# Patient Record
Sex: Female | Born: 1990
Health system: Southern US, Community
[De-identification: ages and names within clinical notes are randomized; demographics above are authoritative.]

## PROBLEM LIST (undated history)

## (undated) DIAGNOSIS — E282 Polycystic ovarian syndrome: Secondary | ICD-10-CM

## (undated) DIAGNOSIS — J45909 Unspecified asthma, uncomplicated: Secondary | ICD-10-CM

## (undated) DIAGNOSIS — F419 Anxiety disorder, unspecified: Secondary | ICD-10-CM

## (undated) DIAGNOSIS — N390 Urinary tract infection, site not specified: Secondary | ICD-10-CM

## (undated) HISTORY — DX: Polycystic ovarian syndrome: E28.2

## (undated) HISTORY — DX: Unspecified asthma, uncomplicated: J45.909

## (undated) HISTORY — PX: TONSILLECTOMY: SUR1361

## (undated) HISTORY — PX: SHOULDER SURGERY: SHX246

## (undated) HISTORY — DX: Anxiety disorder, unspecified: F41.9

## (undated) HISTORY — PX: BREAST SURGERY: SHX581

## (undated) HISTORY — DX: Urinary tract infection, site not specified: N39.0

---
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
## 2010-04-28 HISTORY — PX: BREAST ENHANCEMENT SURGERY: SHX7

## 2016-01-14 ENCOUNTER — Encounter: Payer: Self-pay | Admitting: Family Medicine

## 2016-01-14 ENCOUNTER — Ambulatory Visit (INDEPENDENT_AMBULATORY_CARE_PROVIDER_SITE_OTHER): Payer: Managed Care, Other (non HMO) | Admitting: Family Medicine

## 2016-01-14 ENCOUNTER — Ambulatory Visit (INDEPENDENT_AMBULATORY_CARE_PROVIDER_SITE_OTHER)
Admission: RE | Admit: 2016-01-14 | Discharge: 2016-01-14 | Disposition: A | Discharge status: Home / Self Care | Payer: Managed Care, Other (non HMO) | Source: Ambulatory Visit | Attending: Family Medicine | Admitting: Family Medicine

## 2016-01-14 VITALS — BP 110/76 | HR 66 | Ht 65.0 in | Wt 145.1 lb

## 2016-01-14 DIAGNOSIS — M79671 Pain in right foot: Secondary | ICD-10-CM

## 2016-01-14 MED ORDER — DICLOFENAC SODIUM 75 MG PO TBEC: 75.0000 mg | DELAYED_RELEASE_TABLET | Freq: Two times a day (BID) | ORAL | 0 refills | Status: DC

## 2016-01-14 NOTE — Patient Instructions (Signed)
Use the diclofenac as needed.   Heel cups or inserts in shoes. Comfortable supportive shoes with activity.  Follow up if you fail to improve or worsen.  Take care  Dr. Adriana Simasook   Plantar Fasciitis With Rehab The plantar fascia is a fibrous, ligament-like, soft-tissue structure that spans the bottom of the foot. Plantar fasciitis, also called heel spur syndrome, is a condition that causes pain in the foot due to inflammation of the tissue. SYMPTOMS   Pain and tenderness on the underneath side of the foot.  Pain that worsens with standing or walking. CAUSES  Plantar fasciitis is caused by irritation and injury to the plantar fascia on the underneath side of the foot. Common mechanisms of injury include:  Direct trauma to bottom of the foot.  Damage to a small nerve that runs under the foot where the main fascia attaches to the heel bone.  Stress placed on the plantar fascia due to bone spurs. RISK INCREASES WITH:   Activities that place stress on the plantar fascia (running, jumping, pivoting, or cutting).  Poor strength and flexibility.  Improperly fitted shoes.  Tight calf muscles.  Flat feet.  Failure to warm-up properly before activity.  Obesity. PREVENTION  Warm up and stretch properly before activity.  Allow for adequate recovery between workouts.  Maintain physical fitness:  Strength, flexibility, and endurance.  Cardiovascular fitness.  Maintain a health body weight.  Avoid stress on the plantar fascia.  Wear properly fitted shoes, including arch supports for individuals who have flat feet. PROGNOSIS  If treated properly, then the symptoms of plantar fasciitis usually resolve without surgery. However, occasionally surgery is necessary. RELATED COMPLICATIONS   Recurrent symptoms that may result in a chronic condition.  Problems of the lower back that are caused by compensating for the injury, such as limping.  Pain or weakness of the foot during  push-off following surgery.  Chronic inflammation, scarring, and partial or complete fascia tear, occurring more often from repeated injections. TREATMENT  Treatment initially involves the use of ice and medication to help reduce pain and inflammation. The use of strengthening and stretching exercises may help reduce pain with activity, especially stretches of the Achilles tendon. These exercises may be performed at home or with a therapist. Your caregiver may recommend that you use heel cups of arch supports to help reduce stress on the plantar fascia. Occasionally, corticosteroid injections are given to reduce inflammation. If symptoms persist for greater than 6 months despite non-surgical (conservative), then surgery may be recommended.  MEDICATION   If pain medication is necessary, then nonsteroidal anti-inflammatory medications, such as aspirin and ibuprofen, or other minor pain relievers, such as acetaminophen, are often recommended.  Do not take pain medication within 7 days before surgery.  Prescription pain relievers may be given if deemed necessary by your caregiver. Use only as directed and only as much as you need.  Corticosteroid injections may be given by your caregiver. These injections should be reserved for the most serious cases, because they may only be given a certain number of times. HEAT AND COLD  Cold treatment (icing) relieves pain and reduces inflammation. Cold treatment should be applied for 10 to 15 minutes every 2 to 3 hours for inflammation and pain and immediately after any activity that aggravates your symptoms. Use ice packs or massage the area with a piece of ice (ice massage).  Heat treatment may be used prior to performing the stretching and strengthening activities prescribed by your caregiver, physical therapist, or  Product/process development scientist. Use a heat pack or soak the injury in warm water. SEEK IMMEDIATE MEDICAL CARE IF:  Treatment seems to offer no benefit, or the  condition worsens.  Any medications produce adverse side effects. EXERCISES RANGE OF MOTION (ROM) AND STRETCHING EXERCISES - Plantar Fasciitis (Heel Spur Syndrome) These exercises may help you when beginning to rehabilitate your injury. Your symptoms may resolve with or without further involvement from your physician, physical therapist or athletic trainer. While completing these exercises, remember:   Restoring tissue flexibility helps normal motion to return to the joints. This allows healthier, less painful movement and activity.  An effective stretch should be held for at least 30 seconds.  A stretch should never be painful. You should only feel a gentle lengthening or release in the stretched tissue. RANGE OF MOTION - Toe Extension, Flexion  Sit with your right / left leg crossed over your opposite knee.  Grasp your toes and gently pull them back toward the top of your foot. You should feel a stretch on the bottom of your toes and/or foot.  Hold this stretch for __________ seconds.  Now, gently pull your toes toward the bottom of your foot. You should feel a stretch on the top of your toes and or foot.  Hold this stretch for __________ seconds. Repeat __________ times. Complete this stretch __________ times per day.  RANGE OF MOTION - Ankle Dorsiflexion, Active Assisted  Remove shoes and sit on a chair that is preferably not on a carpeted surface.  Place right / left foot under knee. Extend your opposite leg for support.  Keeping your heel down, slide your right / left foot back toward the chair until you feel a stretch at your ankle or calf. If you do not feel a stretch, slide your bottom forward to the edge of the chair, while still keeping your heel down.  Hold this stretch for __________ seconds. Repeat __________ times. Complete this stretch __________ times per day.  STRETCH - Gastroc, Standing  Place hands on wall.  Extend right / left leg, keeping the front knee  somewhat bent.  Slightly point your toes inward on your back foot.  Keeping your right / left heel on the floor and your knee straight, shift your weight toward the wall, not allowing your back to arch.  You should feel a gentle stretch in the right / left calf. Hold this position for __________ seconds. Repeat __________ times. Complete this stretch __________ times per day. STRETCH - Soleus, Standing  Place hands on wall.  Extend right / left leg, keeping the other knee somewhat bent.  Slightly point your toes inward on your back foot.  Keep your right / left heel on the floor, bend your back knee, and slightly shift your weight over the back leg so that you feel a gentle stretch deep in your back calf.  Hold this position for __________ seconds. Repeat __________ times. Complete this stretch __________ times per day. STRETCH - Gastrocsoleus, Standing  Note: This exercise can place a lot of stress on your foot and ankle. Please complete this exercise only if specifically instructed by your caregiver.   Place the ball of your right / left foot on a step, keeping your other foot firmly on the same step.  Hold on to the wall or a rail for balance.  Slowly lift your other foot, allowing your body weight to press your heel down over the edge of the step.  You should feel a  stretch in your right / left calf.  Hold this position for __________ seconds.  Repeat this exercise with a slight bend in your right / left knee. Repeat __________ times. Complete this stretch __________ times per day.  STRENGTHENING EXERCISES - Plantar Fasciitis (Heel Spur Syndrome)  These exercises may help you when beginning to rehabilitate your injury. They may resolve your symptoms with or without further involvement from your physician, physical therapist or athletic trainer. While completing these exercises, remember:   Muscles can gain both the endurance and the strength needed for everyday activities  through controlled exercises.  Complete these exercises as instructed by your physician, physical therapist or athletic trainer. Progress the resistance and repetitions only as guided. STRENGTH - Towel Curls  Sit in a chair positioned on a non-carpeted surface.  Place your foot on a towel, keeping your heel on the floor.  Pull the towel toward your heel by only curling your toes. Keep your heel on the floor.  If instructed by your physician, physical therapist or athletic trainer, add ____________________ at the end of the towel. Repeat __________ times. Complete this exercise __________ times per day. STRENGTH - Ankle Inversion  Secure one end of a rubber exercise band/tubing to a fixed object (table, pole). Loop the other end around your foot just before your toes.  Place your fists between your knees. This will focus your strengthening at your ankle.  Slowly, pull your big toe up and in, making sure the band/tubing is positioned to resist the entire motion.  Hold this position for __________ seconds.  Have your muscles resist the band/tubing as it slowly pulls your foot back to the starting position. Repeat __________ times. Complete this exercises __________ times per day.    This information is not intended to replace advice given to you by your health care provider. Make sure you discuss any questions you have with your health care provider.   Document Released: 04/14/2005 Document Revised: 08/29/2014 Document Reviewed: 07/27/2008 Elsevier Interactive Patient Education [employer].

## 2016-01-14 NOTE — Progress Notes (Signed)
Pre visit review using our clinic review tool, if applicable. No additional management support is needed unless otherwise documented below in the visit note. 

## 2016-01-15 ENCOUNTER — Encounter: Payer: Self-pay | Admitting: Family Medicine

## 2016-01-15 DIAGNOSIS — M79673 Pain in unspecified foot: Secondary | ICD-10-CM | POA: Insufficient documentation

## 2016-01-15 NOTE — Assessment & Plan Note (Signed)
New problem. Appears consistent with plantar fasciitis. Obtaining xray to assess potential stress fracture.  Treating with Diclofenac, heel cups/insoles, good supportive shoes. Handout with exercises given.

## 2016-01-15 NOTE — Progress Notes (Signed)
Subjective:  Patient ID: [name], female    DOB: November 20, 1990  Age: 25 y.o. MRN: [id number]  CC: Right heel pain.  HPI [name] is a 25 y.o. female presents to the clinic today with complaints of heel pain.  Right heel pain  Has been present for the past 2 weeks.  Pain is severe.  She reports associated numbness of the heel.  Worse at the end of the day after working/being on her feet all day. Patient states that this is exacerbated by having to wear high heels at work.   Also worse with dorsiflexion.  She has taken Aleve/Advil without improvement. She's used some insoles and changed her shoes without significant improvement as well.  Patient states that she walks long distances at work in high heels.  No other physical activity.  No other associated symptoms.  No other complaints at this time  PMH, Surgical Hx, Family Hx, Social History reviewed and updated as below.  Past Medical History:  Diagnosis Date  . Asthma    Past Surgical History:  Procedure Laterality Date  . BREAST ENHANCEMENT SURGERY  2012   Family History  Problem Relation Age of Onset  . Arthritis Mother   . Heart disease Mother   . Stroke Mother   . Hypertension Mother   . Mental illness Mother   . Diabetes Mother   . Mental illness Father   . Diabetes Father    Social History  Substance Use Topics  . Smoking status: Never Smoker  . Smokeless tobacco: Never Used  . Alcohol use Not on file   Review of Systems  Musculoskeletal:       Right heel pain.   All other systems reviewed and are negative.   Objective:   Today's Vitals: BP 110/76 (BP Location: Left Arm, Patient Position: Sitting, Cuff Size: Normal)   Pulse 66   Ht 5\' 5"  (1.651 m)   Wt 145 lb 2 oz (65.8 kg)   LMP 01/11/2016   SpO2 98%   BMI 24.15 kg/m   Physical Exam  Constitutional: She is oriented to person, place, and time. She appears well-developed and well-nourished. No distress.  HENT:  Head:  Normocephalic and atraumatic.  Nose: Nose normal.  Mouth/Throat: Oropharynx is clear and moist. No oropharyngeal exudate.  Normal TM's bilaterally.   Eyes: Conjunctivae are normal. No scleral icterus.  Neck: Neck supple. No thyromegaly present.  Cardiovascular: Normal rate and regular rhythm.   No murmur heard. Pulmonary/Chest: Effort normal and breath sounds normal. She has no wheezes. She has no rales.  Abdominal: Soft. She exhibits no distension. There is no tenderness. There is no rebound and no guarding.  Musculoskeletal:  Right foot - Patient with pain of the heel with dorsiflexion. Heel nontender to palpation. Normal gait. Normal ankle.   Lymphadenopathy:    She has no cervical adenopathy.  Neurological: She is alert and oriented to person, place, and time.  Skin: Skin is warm and dry. No rash noted.  Psychiatric: She has a normal mood and affect.  Vitals reviewed.  Assessment & Plan:   Problem List Items Addressed This Visit    Heel pain - Primary    New problem. Appears consistent with plantar fasciitis. Obtaining xray to assess potential stress fracture.  Treating with Diclofenac, heel cups/insoles, good supportive shoes. Handout with exercises given.       Relevant Orders   DG Foot Complete Right    Other Visit Diagnoses   None.  Outpatient Encounter Prescriptions as of 01/14/2016  Medication Sig  . busPIRone (BUSPAR) 5 MG tablet   . diclofenac (VOLTAREN) 75 MG EC tablet Take 1 tablet (75 mg total) by mouth 2 (two) times daily.   No facility-administered encounter medications on file as of 01/14/2016.    Follow-up: PRN  Everlene OtherJayce Marlaya Turck DO [facility] [place]

## 2016-02-13 ENCOUNTER — Ambulatory Visit: Payer: Self-pay | Admitting: Family Medicine

## 2016-06-17 ENCOUNTER — Encounter: Payer: Self-pay | Admitting: Family Medicine

## 2016-06-17 ENCOUNTER — Ambulatory Visit (INDEPENDENT_AMBULATORY_CARE_PROVIDER_SITE_OTHER): Payer: Managed Care, Other (non HMO) | Admitting: Family Medicine

## 2016-06-17 DIAGNOSIS — Z87440 Personal history of urinary (tract) infections: Secondary | ICD-10-CM | POA: Insufficient documentation

## 2016-06-17 NOTE — Assessment & Plan Note (Signed)
New problem. Now resolved. Doing well. Advised good hygiene especially post intercourse. Advised to avoid significant after intercourse. Patient to let me know if symptoms recur. May need urology referral for prophylactic antibiotic in the future.

## 2016-06-17 NOTE — Progress Notes (Signed)
Pre visit review using our clinic review tool, if applicable. No additional management support is needed unless otherwise documented below in the visit note. 

## 2016-06-17 NOTE — Progress Notes (Signed)
   Subjective:  Patient ID: [name], female    DOB: [birth year]  Age: 26 y.o. MRN: [id number]  CC: ED follow up  HPI:  26 year old female presents for ED follow up.  Patient presented on 2/12 after experiencing flank pain and hematuria/dysuria. CT scan was obtained was negative for pyelonephritis. She was treated with Rocephin and discharged home on Keflex. She reports a resolution in her UTI symptoms. No additional hematuria. No current symptoms. She has increased her fluid intake. She has a history of UTI. This may have been brought on by recent intercourse. No current abdominal pain or flank pain. No other complaints or concerns at this time. She is feeling well.  Social Hx   Social History   Social History  . Marital status: Unknown    Spouse name: N/A  . Number of children: N/A  . Years of education: N/A   Social History Main Topics  . Smoking status: Never Smoker  . Smokeless tobacco: Never Used  . Alcohol use None  . Drug use: No  . Sexual activity: Not Asked   Other Topics Concern  . None   Social History Narrative  . None   Review of Systems  Constitutional: Negative.   Genitourinary: Negative.    Objective:  BP 107/71   Pulse 90   Temp 98.7 F (37.1 C) (Oral)   Wt 153 lb 9.6 oz (69.7 kg)   SpO2 98%   BMI 25.56 kg/m   BP/Weight 06/17/2016 01/14/2016  Systolic BP 107 110  Diastolic BP 71 76  Wt. (Lbs) 153.6 145.13  BMI 25.56 24.15   Physical Exam  Constitutional: She is oriented to person, place, and time. She appears well-developed. No distress.  Cardiovascular: Normal rate and regular rhythm.   Pulmonary/Chest: Effort normal and breath sounds normal.  Abdominal: Soft. She exhibits no distension. There is no tenderness. There is no rebound and no guarding.  Neurological: She is alert and oriented to person, place, and time.  Psychiatric: She has a normal mood and affect.  Vitals reviewed.  Assessment & Plan:   Problem List Items Addressed  This Visit    History of UTI    New problem. Now resolved. Doing well. Advised good hygiene especially post intercourse. Advised to avoid significant after intercourse. Patient to let me know if symptoms recur. May need urology referral for prophylactic antibiotic in the future.        Follow-up: PRN  Everlene OtherJayce Pesach Frisch DO [facility] [place]

## 2016-06-17 NOTE — Patient Instructions (Signed)
Let me know if you have any issues/recurrences.  Follow up annually.   Take care  Dr. Adriana Simasook

## 2016-09-15 ENCOUNTER — Telehealth: Payer: Self-pay | Admitting: Family Medicine

## 2016-09-15 NOTE — Telephone Encounter (Signed)
Unable to lm on vm, voice box full. Tried to schedule a physical for patient.

## 2016-09-18 ENCOUNTER — Ambulatory Visit (INDEPENDENT_AMBULATORY_CARE_PROVIDER_SITE_OTHER): Payer: Managed Care, Other (non HMO) | Admitting: Family Medicine

## 2016-09-18 ENCOUNTER — Encounter: Payer: Self-pay | Admitting: Family Medicine

## 2016-09-18 VITALS — BP 108/76 | HR 92 | Temp 99.0°F | Resp 16 | Ht 65.0 in | Wt 155.0 lb

## 2016-09-18 DIAGNOSIS — Z30011 Encounter for initial prescription of contraceptive pills: Secondary | ICD-10-CM | POA: Diagnosis not present

## 2016-09-18 DIAGNOSIS — R21 Rash and other nonspecific skin eruption: Secondary | ICD-10-CM | POA: Diagnosis not present

## 2016-09-18 LAB — POCT URINE PREGNANCY: Preg Test, Ur: NEGATIVE

## 2016-09-18 MED ORDER — DROSPIRENONE-ETHINYL ESTRADIOL 3-0.02 MG PO TABS: 1.0000 | ORAL_TABLET | Freq: Every day | ORAL | 11 refills | Status: DC

## 2016-09-18 NOTE — Progress Notes (Signed)
   Subjective:  Patient ID: Melanie BarnsAmber C Olivier, female    DOB: [birth year]  Age: 26 y.o. MRN: [id number]  CC: Discuss birth control, rash  HPI:  26 year old female presents with the above complaints.  Birth control  Patient reports that she was previously on an oral contraceptive.  She states that since moving to this area she has not been on anything.  She would like to discuss restarting oral contraceptives today.  Last LMP was 2 weeks ago.  Rash  Patient reports she developed a facial rash on Monday.  Rashes located on the lower jaw line/chin on the right side.  She reports associated burning.  She states she was recently bitten by a tick and thinks that this may be contributing.  Patient states that she's had psoriasis previously. She has used a prescription corticosteroid with no improvement.  No known exacerbating factors.  No other associated symptoms. No other complaints or concerns at this time.  Social Hx   Social History   Social History  . Marital status: Unknown    Spouse name: N/A  . Number of children: N/A  . Years of education: N/A   Social History Main Topics  . Smoking status: Never Smoker  . Smokeless tobacco: Never Used  . Alcohol use None  . Drug use: No  . Sexual activity: Not Asked   Other Topics Concern  . None   Social History Narrative  . None    Review of Systems  Constitutional: Negative.   Skin: Positive for rash.   Objective:  BP 108/76   Pulse 92   Temp 99 F (37.2 C) (Oral)   Resp 16   Ht 5\' 5"  (1.651 m)   Wt 155 lb (70.3 kg)   LMP 09/04/2016   SpO2 98%   BMI 25.79 kg/m   BP/Weight 09/18/2016 06/17/2016 01/14/2016  Systolic BP 108 107 110  Diastolic BP 76 71 76  Wt. (Lbs) 155 153.6 145.13  BMI 25.79 25.56 24.15    Physical Exam  Constitutional: She is oriented to person, place, and time. She appears well-developed. No distress.  Pulmonary/Chest: Effort normal.  Neurological: She is alert and oriented to  person, place, and time.  Skin:     Dryness noted on the chin. See picture above. No appreciable surrounding erythema although the patient had makeup on.  Psychiatric: She has a normal mood and affect.  Vitals reviewed.    Assessment & Plan:   Problem List Items Addressed This Visit    Rash    New problem.  Etiology of her rash is unclear. Advised to avoid steroids as it may then enlighten the skin. Sending to dermatology.      Relevant Orders   Ambulatory referral to Dermatology   Encounter for initial prescription of contraceptive pills - Primary    New problem/issue.  Starting Yaz.  Urine pregnancy test negative.       Relevant Orders   POCT urine pregnancy (Completed)      Meds ordered this encounter  Medications  . drospirenone-ethinyl estradiol (YAZ,GIANVI,LORYNA) 3-0.02 MG tablet    Sig: Take 1 tablet by mouth daily.    Dispense:  1 Package    Refill:  11   Follow-up: PRN  Everlene OtherJayce Jaizon Deroos DO [facility] [place]

## 2016-09-18 NOTE — Assessment & Plan Note (Signed)
New problem/issue.  Starting Yaz.  Urine pregnancy test negative.

## 2016-09-18 NOTE — Patient Instructions (Signed)
Start the yaz.  We will call with the referral.  Take care  Dr. Adriana Simasook

## 2016-09-18 NOTE — Assessment & Plan Note (Signed)
New problem.  Etiology of her rash is unclear. Advised to avoid steroids as it may then enlighten the skin. Sending to dermatology.

## 2016-10-09 ENCOUNTER — Telehealth: Payer: Self-pay

## 2016-10-09 NOTE — Telephone Encounter (Signed)
[facility] made several attempt to contact patient and was not able to schedule appt with patient due to no contact from the patient. LMOM for patient to see if she is still wanting to do the referral or to cancel. Referral will remain for another six months so a new one is not needed.

## 2017-12-28 ENCOUNTER — Encounter: Payer: Self-pay | Admitting: Family Medicine

## 2017-12-29 ENCOUNTER — Ambulatory Visit: Payer: Managed Care, Other (non HMO) | Admitting: Family Medicine

## 2017-12-29 ENCOUNTER — Encounter: Payer: Self-pay | Admitting: Family Medicine

## 2017-12-29 VITALS — BP 110/66 | HR 87 | Temp 98.6°F | Ht 67.0 in | Wt 156.2 lb

## 2017-12-29 DIAGNOSIS — Z789 Other specified health status: Secondary | ICD-10-CM

## 2017-12-29 DIAGNOSIS — G4700 Insomnia, unspecified: Secondary | ICD-10-CM

## 2017-12-29 DIAGNOSIS — F419 Anxiety disorder, unspecified: Secondary | ICD-10-CM

## 2017-12-29 DIAGNOSIS — E282 Polycystic ovarian syndrome: Secondary | ICD-10-CM

## 2017-12-29 DIAGNOSIS — IMO0001 Reserved for inherently not codable concepts without codable children: Secondary | ICD-10-CM

## 2017-12-29 MED ORDER — ESCITALOPRAM OXALATE 10 MG PO TABS
10.0000 mg | ORAL_TABLET | Freq: Every day | ORAL | 2 refills | Status: DC
Start: 2017-12-29 — End: 2018-02-05

## 2017-12-29 MED ORDER — HYDROXYZINE HCL 10 MG PO TABS: 10.0000 mg | ORAL_TABLET | Freq: Three times a day (TID) | ORAL | 2 refills | Status: DC | PRN

## 2017-12-29 MED ORDER — DROSPIRENONE-ETHINYL ESTRADIOL 3-0.02 MG PO TABS: 1.0000 | ORAL_TABLET | Freq: Every day | ORAL | 3 refills | Status: DC

## 2017-12-29 NOTE — Progress Notes (Signed)
Subjective:    Patient ID: [name], female    DOB: 1991/04/05, 27 y.o.   MRN: [id number]  HPI   Patient presents to clinic to discuss increased anxiety, insomnia and getting back on birth control.  States she is been dealing with anxiety off and on for many years.  Currently she has noted panic attacks have been happening 1 or 2 times a month.  She does have some increased stress in her life due to traveling more often with her job, new position requires her to be away from home about 3 weeks out of the month.  Currently is seeing a therapist for counseling and patient feels this been quite helpful.  Also has struggled with insomnia for many years.  She has tried [place], trazodone, Ambien in the past all without much good effect.  States the only medication that was somewhat helpful to her was Belsomra.  Patient reports history of PCOS.  Has taken-birth control in the past with great success.  States this birth control was easy to take, well-tolerated, and made her menstrual cycle come every month like clockwork.  She would like to go back on Yaz.  Patient Active Problem List   Diagnosis Date Noted  . Anxiety 12/30/2017  . Contraception 12/30/2017  . PCOS (polycystic ovarian syndrome) 12/30/2017  . Insomnia 12/30/2017  . Encounter for initial prescription of contraceptive pills 09/18/2016  . Rash 09/18/2016  . History of UTI 06/17/2016  . Heel pain 01/15/2016   Social History   Tobacco Use  . Smoking status: Never Smoker  . Smokeless tobacco: Never Used  Substance Use Topics  . Alcohol use: Yes   Review of Systems  Constitutional: Negative for chills, fatigue and fever.  HENT: Negative for congestion, ear pain, sinus pain and sore throat.   Eyes: Negative.   Respiratory: Negative for cough, shortness of breath and wheezing.   Cardiovascular: Negative for chest pain, palpitations and leg swelling.  Gastrointestinal: Negative for abdominal pain, diarrhea, nausea and  vomiting.  Genitourinary: Negative for dysuria, frequency and urgency.  Musculoskeletal: Negative for arthralgias and myalgias.  Skin: Negative for color change, pallor and rash.  Neurological: Negative for syncope, light-headedness and headaches.  Psychiatric/Behavioral: Increased anxiety, panic attacks, insomnia     Objective:   Physical Exam  Constitutional: She is oriented to person, place, and time. She appears well-developed and well-nourished. No distress.  HENT:  Head: Normocephalic and atraumatic.  Eyes: Conjunctivae and EOM are normal. No scleral icterus.  Neck: Neck supple. No tracheal deviation present.  Cardiovascular: Normal rate and regular rhythm.  Pulmonary/Chest: Effort normal and breath sounds normal. No respiratory distress.  Neurological: She is alert and oriented to person, place, and time. No cranial nerve deficit.  Skin: Skin is warm and dry. She is not diaphoretic. No pallor.  Psychiatric: She has a normal mood and affect. Her behavior is normal. Judgment and thought content normal.  No suicidal ideations  Nursing note and vitals reviewed.   Vitals:   12/29/17 1600  BP: 110/66  Pulse: 87  Temp: 98.6 F (37 C)  SpO2: 98%      Assessment & Plan:   Anxiety-patient will trial low-dose Lexapro once daily to better control overall mood.  She will also have hydroxyzine as needed for breakthrough anxiety or panic, patient is aware this medication can potentially make her sleepy however it is nonnarcotic.  She will also continue her counseling as this has been helpful thus far.  Insomnia- patient  has tried and failed with Lunesta, trazodone, Ambien in the past.  She has had some success with Belsomra, so we will start with 5 mg dosing of this at bedtime as needed to help with insomnia.  Contraception/PCOS- urine pregnancy in clinic is negative.  She will start on Yaz oral birth control.  Patient aware to take this medication once daily at same time every day for  best effect.  Birth control will also be helpful in regulating her menstrual cycle, because often with PCOS the menstrual cycle is irregular.  She will follow-up in 3 to 4 weeks for recheck on mood after starting new medications.

## 2017-12-29 NOTE — Patient Instructions (Signed)

## 2017-12-30 ENCOUNTER — Encounter: Payer: Self-pay | Admitting: Family Medicine

## 2017-12-30 DIAGNOSIS — G4700 Insomnia, unspecified: Secondary | ICD-10-CM | POA: Insufficient documentation

## 2017-12-30 DIAGNOSIS — F329 Major depressive disorder, single episode, unspecified: Secondary | ICD-10-CM | POA: Insufficient documentation

## 2017-12-30 DIAGNOSIS — IMO0001 Reserved for inherently not codable concepts without codable children: Secondary | ICD-10-CM | POA: Insufficient documentation

## 2017-12-30 DIAGNOSIS — F32A Depression, unspecified: Secondary | ICD-10-CM | POA: Insufficient documentation

## 2017-12-30 DIAGNOSIS — E282 Polycystic ovarian syndrome: Secondary | ICD-10-CM | POA: Insufficient documentation

## 2017-12-30 DIAGNOSIS — F419 Anxiety disorder, unspecified: Secondary | ICD-10-CM | POA: Insufficient documentation

## 2017-12-30 LAB — POCT URINE PREGNANCY: Preg Test, Ur: NEGATIVE

## 2017-12-30 MED ORDER — SUVOREXANT 5 MG PO TABS: 5.0000 mg | ORAL_TABLET | Freq: Every day | ORAL | 0 refills | Status: DC

## 2018-01-22 ENCOUNTER — Telehealth: Payer: Self-pay | Admitting: Family Medicine

## 2018-01-22 NOTE — Telephone Encounter (Signed)
Called Pt no answer, couldn't leave a message mailbox was full.

## 2018-01-22 NOTE — Telephone Encounter (Signed)
Concern about medication

## 2018-01-22 NOTE — Telephone Encounter (Signed)
Copied from CRM [id number]. Topic: General - Other >> Jan 22, 2018  1:43 PM [name] R wrote: Patient is wanting a callback concerning her Lexapro. She doesn't like the side effects

## 2018-01-25 NOTE — Telephone Encounter (Signed)
Called Pt couldn't leave a message mailbox is full

## 2018-01-26 NOTE — Telephone Encounter (Signed)
Called Pt, No answer, Answering machine is full.

## 2018-01-27 NOTE — Telephone Encounter (Signed)
Called Pt No answer and mailbox was full

## 2018-01-29 ENCOUNTER — Ambulatory Visit: Payer: Managed Care, Other (non HMO) | Admitting: Family Medicine

## 2018-01-29 NOTE — Progress Notes (Deleted)
   Subjective:    Patient ID: [name], female    DOB: March 21, 1991, 27 y.o.   MRN: [id number]  HPI   Presents to clinic to follow up on anxiety and insomnia. She started low dose lexapro and also has been using Belsomra to help sleep  Patient Active Problem List   Diagnosis Date Noted  . Anxiety 12/30/2017  . Contraception 12/30/2017  . PCOS (polycystic ovarian syndrome) 12/30/2017  . Insomnia 12/30/2017  . Encounter for initial prescription of contraceptive pills 09/18/2016  . Rash 09/18/2016  . History of UTI 06/17/2016  . Heel pain 01/15/2016   Social History   Tobacco Use  . Smoking status: Never Smoker  . Smokeless tobacco: Never Used  Substance Use Topics  . Alcohol use: Yes   Review of Systems   Constitutional: Negative for chills, fatigue and fever.  HENT: Negative for congestion, ear pain, sinus pain and sore throat.   Eyes: Negative.   Respiratory: Negative for cough, shortness of breath and wheezing.   Cardiovascular: Negative for chest pain, palpitations and leg swelling.  Gastrointestinal: Negative for abdominal pain, diarrhea, nausea and vomiting.  Genitourinary: Negative for dysuria, frequency and urgency.  Musculoskeletal: Negative for arthralgias and myalgias.  Skin: Negative for color change, pallor and rash.  Neurological: Negative for syncope, light-headedness and headaches.  Psychiatric/Behavioral: The patient is not nervous/anxious.       Objective:   Physical Exam        Assessment & Plan:

## 2018-02-05 ENCOUNTER — Encounter: Payer: Self-pay | Admitting: Family Medicine

## 2018-02-05 ENCOUNTER — Ambulatory Visit: Payer: Managed Care, Other (non HMO) | Admitting: Family Medicine

## 2018-02-05 VITALS — BP 100/72 | HR 91 | Temp 98.7°F | Ht 67.0 in | Wt 156.0 lb

## 2018-02-05 DIAGNOSIS — G4700 Insomnia, unspecified: Secondary | ICD-10-CM

## 2018-02-05 DIAGNOSIS — F419 Anxiety disorder, unspecified: Secondary | ICD-10-CM

## 2018-02-05 MED ORDER — ESCITALOPRAM OXALATE 5 MG PO TABS: 5.0000 mg | ORAL_TABLET | Freq: Every day | ORAL | 2 refills | Status: DC

## 2018-02-05 NOTE — Progress Notes (Signed)
Subjective:    Patient ID: [name], female    DOB: [birth year], 27 y.o.   MRN: [id number]  HPI   Patient presents to clinic to follow-up on anxiety and insomnia.  At last visit patient was started on 10 mg Lexapro to help better control anxiety and also Belsomra as needed for insomnia.  Patient states that Lexapro almost makes her feel too tired, she is able to push through during the week, on the weekends feels like she could sleep for many hours.  States she has not even needed to use Belsomra at all due to feeling too tired on the Lexapro.   Does feel that her anxiety has improved with medicine, denies any SI.  Patient Active Problem List   Diagnosis Date Noted  . Anxiety 12/30/2017  . Contraception 12/30/2017  . PCOS (polycystic ovarian syndrome) 12/30/2017  . Insomnia 12/30/2017  . Encounter for initial prescription of contraceptive pills 09/18/2016  . Rash 09/18/2016  . History of UTI 06/17/2016  . Heel pain 01/15/2016   Social History   Tobacco Use  . Smoking status: Never Smoker  . Smokeless tobacco: Never Used  Substance Use Topics  . Alcohol use: Yes   Review of Systems  Constitutional: Negative for chills, fatigue and fever.  HENT: Negative for congestion, ear pain, sinus pain and sore throat.   Eyes: Negative.   Respiratory: Negative for cough, shortness of breath and wheezing.   Cardiovascular: Negative for chest pain, palpitations and leg swelling.  Gastrointestinal: Negative for abdominal pain, diarrhea, nausea and vomiting.  Genitourinary: Negative for dysuria, frequency and urgency.  Musculoskeletal: Negative for arthralgias and myalgias.  Skin: Negative for color change, pallor and rash.  Neurological: Negative for syncope, light-headedness and headaches.  Psychiatric/Behavioral: Feels too tired now      Objective:   Physical Exam  Constitutional: She is oriented to person, place, and time. She appears well-nourished. No distress.  HENT:    Head: Normocephalic and atraumatic.  Eyes: Conjunctivae and EOM are normal. No scleral icterus.  Neck: Normal range of motion. Neck supple. No tracheal deviation present.  Cardiovascular: Normal rate and regular rhythm.  Pulmonary/Chest: Effort normal and breath sounds normal. No respiratory distress.  Musculoskeletal: She exhibits no edema.  Gait normal  Neurological: She is alert and oriented to person, place, and time.  Skin: Skin is warm and dry. Capillary refill takes less than 2 seconds. No pallor.  Psychiatric: She has a normal mood and affect. Her behavior is normal. Judgment and thought content normal.  Good eye contact. Can clearly express thoughts.   Nursing note and vitals reviewed.      Vitals:   02/05/18 1513  BP: 100/72  Pulse: 91  Temp: 98.7 F (37.1 C)  SpO2: 98%    Assessment & Plan:    A total of 25  minutes were spent face-to-face with the patient during this encounter and over half of that time was spent on counseling and coordination of care. The patient was counseled on medication changes, anxiety relief strategy, non medication ways to help insomnia.    Anxiety- we will decrease Lexapro dose to 5 mg/day.  Patient has noticed some benefit from this medication, but the higher dose seems to make her too sleepy.  I am hopeful that the lower dose of 5 mg will be more beneficial to patient.  Insomnia- patient has Belsomra to use as needed.  She has used this medication in the past with success  Follow-up in  approximately 4 to 6 weeks to see how the decreased dose of Lexapro is going.

## 2018-02-08 ENCOUNTER — Encounter: Payer: Self-pay | Admitting: Family Medicine

## 2018-02-15 ENCOUNTER — Encounter: Payer: Self-pay | Admitting: Family Medicine

## 2018-02-15 ENCOUNTER — Ambulatory Visit: Payer: Managed Care, Other (non HMO) | Admitting: Family Medicine

## 2018-02-15 VITALS — BP 98/58 | HR 73 | Temp 98.6°F | Ht 67.0 in | Wt 158.7 lb

## 2018-02-15 DIAGNOSIS — F419 Anxiety disorder, unspecified: Secondary | ICD-10-CM | POA: Diagnosis not present

## 2018-02-15 DIAGNOSIS — G4700 Insomnia, unspecified: Secondary | ICD-10-CM

## 2018-02-15 MED ORDER — BUPROPION HCL ER (XL) 150 MG PO TB24: 150.0000 mg | ORAL_TABLET | Freq: Every day | ORAL | 2 refills | Status: DC

## 2018-02-15 NOTE — Progress Notes (Signed)
   Subjective:    Patient ID: [name], female    DOB: 05-25-1990, 27 y.o.   MRN: [id number]  HPI   Patient presents to clinic due to increased anxiety after decreasing lexapro to 5mg  per day. The 10mg  lexapro dose was making her too sleepy, but was helping somewhat with anxiety. States now she feels anxious and almost in a panic almost every day since decreasing the dose.   Patient denies any SI or HI.  Patient Active Problem List   Diagnosis Date Noted  . Anxiety 12/30/2017  . Contraception 12/30/2017  . PCOS (polycystic ovarian syndrome) 12/30/2017  . Insomnia 12/30/2017  . Encounter for initial prescription of contraceptive pills 09/18/2016  . Rash 09/18/2016  . History of UTI 06/17/2016  . Heel pain 01/15/2016   Social History   Tobacco Use  . Smoking status: Never Smoker  . Smokeless tobacco: Never Used  Substance Use Topics  . Alcohol use: Yes   Review of Systems  Constitutional: Negative for chills, fatigue and fever.  HENT: Negative for congestion, ear pain, sinus pain and sore throat.   Eyes: Negative.   Respiratory: Negative for cough, shortness of breath and wheezing.   Cardiovascular: Negative for chest pain, palpitations and leg swelling.  Gastrointestinal: Negative for abdominal pain, diarrhea, nausea and vomiting.  Genitourinary: Negative for dysuria, frequency and urgency.  Musculoskeletal: Negative for arthralgias and myalgias.  Skin: Negative for color change, pallor and rash.  Neurological: Negative for syncope, light-headedness and headaches.  Psychiatric/Behavioral: The patient is nervous/anxious.       Objective:   Physical Exam  Constitutional: She is oriented to person, place, and time. She appears well-nourished. No distress.  HENT:  Head: Normocephalic and atraumatic.  Eyes: EOM are normal. No scleral icterus.  Neck: Neck supple. No tracheal deviation present.  Cardiovascular: Normal rate and regular rhythm.  Pulmonary/Chest: Effort  normal and breath sounds normal.  Neurological: She is alert and oriented to person, place, and time.  Skin: Skin is warm and dry. No pallor.  Psychiatric: She has a normal mood and affect. Her behavior is normal. Judgment and thought content normal.  Nursing note and vitals reviewed.  Vitals:   02/15/18 1307  BP: (!) 98/58  Pulse: 73  Temp: 98.6 F (37 C)  SpO2: 98%      Assessment & Plan:    Anxiety - decreasing dose of Lexapro to 5 mg was not successful.  Patient's brother has had success with Wellbutrin, so after discussion did medication options patient is agreeable to try Wellbutrin 150 mg once daily.  Patient advised to take this medication in the morning time.  Insomnia - patient has history of insomnia times many years.  She has not needed to use any sleep aid recently due to her problem getting sleepy.  She does have Belsomra on hand to use if needed, patient has used Belsomra in the past with good success  Patient will keep already scheduled follow-up on November 8th 2019 to see how her medication changes are doing.  Advised she can return to clinic sooner if any issues arise.  Patient declines any lab work at this time, states she is afraid of needles and is not yet ready to have blood drawn.

## 2018-03-05 ENCOUNTER — Ambulatory Visit: Payer: Managed Care, Other (non HMO) | Admitting: Family Medicine

## 2018-03-12 ENCOUNTER — Ambulatory Visit: Payer: Managed Care, Other (non HMO) | Admitting: Family Medicine

## 2018-03-12 DIAGNOSIS — Z0289 Encounter for other administrative examinations: Secondary | ICD-10-CM

## 2018-03-12 NOTE — Progress Notes (Deleted)
   Subjective:    Patient ID: [name], female    DOB: 07-11-1990, 27 y.o.   MRN: [id number]  HPI  Presents to clinic for follow up on Anxiety and insomnia.  We originally tried Lexapro for her anxiety, but this medication made her feel very sleepy all day long. Lexapro did allow her to sleep at night, but she was too tired during the day & felt she was unable to accomplish all her tasks at work and home.  We stopped Lexapro, and she has been taking Wellbutrin 150mg  for a little over 3 weeks now  Patient Active Problem List   Diagnosis Date Noted  . Anxiety 12/30/2017  . Contraception 12/30/2017  . PCOS (polycystic ovarian syndrome) 12/30/2017  . Insomnia 12/30/2017  . Encounter for initial prescription of contraceptive pills 09/18/2016  . Rash 09/18/2016  . History of UTI 06/17/2016  . Heel pain 01/15/2016   Social History   Tobacco Use  . Smoking status: Never Smoker  . Smokeless tobacco: Never Used  Substance Use Topics  . Alcohol use: Yes    Review of Systems  Constitutional: Negative for chills, fatigue and fever.  HENT: Negative for congestion, ear pain, sinus pain and sore throat.   Eyes: Negative.   Respiratory: Negative for cough, shortness of breath and wheezing.   Cardiovascular: Negative for chest pain, palpitations and leg swelling.  Gastrointestinal: Negative for abdominal pain, diarrhea, nausea and vomiting.  Genitourinary: Negative for dysuria, frequency and urgency.  Musculoskeletal: Negative for arthralgias and myalgias.  Skin: Negative for color change, pallor and rash.  Neurological: Negative for syncope, light-headedness and headaches.  Psychiatric/Behavioral: The patient is not nervous/anxious.       Objective:   Physical Exam        Assessment & Plan:

## 2018-04-04 ENCOUNTER — Encounter: Payer: Self-pay | Admitting: Emergency Medicine

## 2018-04-04 ENCOUNTER — Emergency Department
Admission: EM | Admit: 2018-04-04 | Discharge: 2018-04-04 | Disposition: A | Discharge status: Home / Self Care | Payer: Managed Care, Other (non HMO) | Attending: Emergency Medicine | Admitting: Emergency Medicine

## 2018-04-04 ENCOUNTER — Other Ambulatory Visit: Payer: Self-pay

## 2018-04-04 ENCOUNTER — Emergency Department: Payer: Managed Care, Other (non HMO)

## 2018-04-04 DIAGNOSIS — R109 Unspecified abdominal pain: Secondary | ICD-10-CM

## 2018-04-04 DIAGNOSIS — Z79899 Other long term (current) drug therapy: Secondary | ICD-10-CM | POA: Insufficient documentation

## 2018-04-04 DIAGNOSIS — E860 Dehydration: Secondary | ICD-10-CM | POA: Diagnosis not present

## 2018-04-04 DIAGNOSIS — R1030 Lower abdominal pain, unspecified: Secondary | ICD-10-CM

## 2018-04-04 DIAGNOSIS — J45909 Unspecified asthma, uncomplicated: Secondary | ICD-10-CM | POA: Diagnosis not present

## 2018-04-04 LAB — URINALYSIS, COMPLETE (UACMP) WITH MICROSCOPIC
Bacteria, UA: NONE SEEN
Bilirubin Urine: NEGATIVE
Glucose, UA: NEGATIVE mg/dL
Ketones, ur: 20 mg/dL — AB
Leukocytes, UA: NEGATIVE
Nitrite: NEGATIVE
Protein, ur: NEGATIVE mg/dL
Specific Gravity, Urine: >1.046 — ABNORMAL HIGH (ref 1.005–1.030)
pH: 6 (ref 5.0–8.0)

## 2018-04-04 LAB — COMPREHENSIVE METABOLIC PANEL
ALT: 28 U/L (ref 0–44)
AST: 20 U/L (ref 15–41)
Albumin: 4.2 g/dL (ref 3.5–5.0)
Alkaline Phosphatase: 55 U/L (ref 38–126)
Anion gap: 11 (ref 5–15)
BUN: 11 mg/dL (ref 6–20)
CO2: 23 mmol/L (ref 22–32)
Calcium: 9.1 mg/dL (ref 8.9–10.3)
Chloride: 104 mmol/L (ref 98–111)
Creatinine, Ser: 0.74 mg/dL (ref 0.44–1.00)
GFR calc Af Amer: >60 mL/min (ref >60)
GFR calc non Af Amer: >60 mL/min (ref >60)
Glucose, Bld: 122 mg/dL — ABNORMAL HIGH (ref 70–99)
Potassium: 3.8 mmol/L (ref 3.5–5.1)
Sodium: 138 mmol/L (ref 135–145)
Total Bilirubin: 0.9 mg/dL (ref 0.3–1.2)
Total Protein: 7.4 g/dL (ref 6.5–8.1)

## 2018-04-04 LAB — LIPASE, BLOOD: Lipase: 26 U/L (ref 11–51)

## 2018-04-04 LAB — CBC
HCT: 45 % (ref 36.0–46.0)
Hemoglobin: 15.2 g/dL — ABNORMAL HIGH (ref 12.0–15.0)
MCH: 32.5 pg (ref 26.0–34.0)
MCHC: 33.8 g/dL (ref 30.0–36.0)
MCV: 96.4 fL (ref 80.0–100.0)
Platelets: 316 10*3/uL (ref 150–400)
RBC: 4.67 MIL/uL (ref 3.87–5.11)
RDW: 12.2 % (ref 11.5–15.5)
WBC: 9.1 10*3/uL (ref 4.0–10.5)
nRBC: 0 % (ref 0.0–0.2)

## 2018-04-04 LAB — I-STAT BETA HCG BLOOD, ED (NOT ORDERABLE): I-stat hCG, quantitative: <5 m[IU]/mL (ref <5)

## 2018-04-04 MED ORDER — IOPAMIDOL (ISOVUE-300) INJECTION 61%
100.0000 mL | Freq: Once | INTRAVENOUS | Status: AC | PRN
  Administered 2018-04-04: 100 mL via INTRAVENOUS

## 2018-04-04 MED ORDER — KETOROLAC TROMETHAMINE 30 MG/ML IJ SOLN
15.0000 mg | INTRAMUSCULAR | Status: AC
  Administered 2018-04-04: 15 mg via INTRAVENOUS
  Filled 2018-04-04: qty 1

## 2018-04-04 MED ORDER — ONDANSETRON 4 MG PO TBDP: 4.0000 mg | ORAL_TABLET | Freq: Three times a day (TID) | ORAL | 0 refills | Status: DC | PRN

## 2018-04-04 MED ORDER — KETOROLAC TROMETHAMINE 10 MG PO TABS: 10.0000 mg | ORAL_TABLET | Freq: Four times a day (QID) | ORAL | 0 refills | Status: DC | PRN

## 2018-04-04 MED ORDER — FENTANYL CITRATE (PF) 100 MCG/2ML IJ SOLN
50.0000 ug | INTRAMUSCULAR | Status: DC | PRN
  Administered 2018-04-04: 50 ug via INTRAVENOUS
  Filled 2018-04-04: qty 2

## 2018-04-04 MED ORDER — SODIUM CHLORIDE 0.9 % IV BOLUS
1000.0000 mL | Freq: Once | INTRAVENOUS | Status: AC
  Administered 2018-04-04: 1000 mL via INTRAVENOUS

## 2018-04-04 NOTE — ED Triage Notes (Signed)
Patient reports waking up this morning to lower abdominal pain. History of PCOS and states pain feel like previous cyst ruptures. Patient reports nausea, denies vomiting. States pain started on right side but now has pain on both sides. States she is normally on oral birth control but has missed the last 3 days due to being unable to get prescription filled. Patient also reports light spotting since this morning.

## 2018-04-04 NOTE — Discharge Instructions (Addendum)
Your urine test does not show any signs of infection, but does show you are significantly dehydrated today.  Take Zofran as needed to control nausea, ketorolac for pain to help control your symptoms and allow for drinking lots of fluids.

## 2018-04-04 NOTE — ED Notes (Signed)
Reviewed discharge instructions, follow-up care, and prescriptions with patient. Patient verbalized understanding of all information reviewed. Patient stable, with no distress noted at this time.    

## 2018-04-04 NOTE — ED Provider Notes (Signed)
[facility] Emergency Department Provider Note  ____________________________________________  Time seen: Approximately 10:26 PM  I have reviewed the triage vital signs and the nursing notes.   HISTORY  Chief Complaint Abdominal Pain    HPI [name] is a 27 y.o. female with a history of anxiety, PCO S who complains of lower abdominal pain that started this morning, gradual onset, severe, worsening throughout the day, constant.  Nonradiating.  No aggravating or alleviating factors.  No dysuria frequency urgency.  She did start her menstrual cycle recently, but this feels much worse than her usual menses.   The timing is off because she left her birth control pills on a cruise ship and is therefore having withdrawal bleed now instead of at the usual 4-week interval.  Denies chest pain shortness of breath fevers chills or back pain.  Pain is bilateral lower quadrants.  She does have a history of PCOS and ovarian cyst.  Denies abnormal vaginal discharge.  No dyspareunia or vaginal pain.  Denies a possibility of STI.  Denies a history of bacterial vaginosis.   Past Medical History:  Diagnosis Date  . Asthma   . UTI (urinary tract infection)      Patient Active Problem List   Diagnosis Date Noted  . Anxiety 12/30/2017  . Contraception 12/30/2017  . PCOS (polycystic ovarian syndrome) 12/30/2017  . Insomnia 12/30/2017  . Encounter for initial prescription of contraceptive pills 09/18/2016  . Rash 09/18/2016  . History of UTI 06/17/2016  . Heel pain 01/15/2016     Past Surgical History:  Procedure Laterality Date  . BREAST ENHANCEMENT SURGERY  2012     Prior to Admission medications   Medication Sig Start Date End Date Taking? Authorizing Provider  buPROPion (WELLBUTRIN XL) 150 MG 24 hr tablet Take 1 tablet (150 mg total) by mouth daily. 02/15/18  Yes Guse, Janna Arch, FNP  drospirenone-ethinyl estradiol (YAZ,GIANVI,LORYNA) 3-0.02 MG tablet Take 1  tablet by mouth daily. 12/29/17  Yes Guse, Janna Arch, FNP  hydrOXYzine (ATARAX/VISTARIL) 10 MG tablet Take 1 tablet (10 mg total) by mouth 3 (three) times daily as needed for anxiety. 12/29/17  Yes Guse, Janna Arch, FNP  Suvorexant (BELSOMRA) 5 MG TABS Take 5 mg by mouth at bedtime. 12/30/17  Yes Guse, Janna Arch, FNP  ketorolac (TORADOL) 10 MG tablet Take 1 tablet (10 mg total) by mouth every 6 (six) hours as needed for moderate pain. 04/04/18   Sharman Cheek, MD  ondansetron (ZOFRAN ODT) 4 MG disintegrating tablet Take 1 tablet (4 mg total) by mouth every 8 (eight) hours as needed for nausea or vomiting. 04/04/18   Sharman Cheek, MD     Allergies Aspirin and Levaquin [levofloxacin in d5w]   Family History  Problem Relation Age of Onset  . Arthritis Mother   . Heart disease Mother   . Stroke Mother   . Hypertension Mother   . Mental illness Mother   . Diabetes Mother   . Mental illness Father   . Diabetes Father     Social History Social History   Tobacco Use  . Smoking status: Never Smoker  . Smokeless tobacco: Never Used  Substance Use Topics  . Alcohol use: Yes  . Drug use: No    Review of Systems  Constitutional:   No fever or chills.  ENT:   No sore throat. No rhinorrhea. Cardiovascular:   No chest pain or syncope. Respiratory:   No dyspnea or cough. Gastrointestinal: Positive as above for  abdominal pain without vomiting and diarrhea.  Musculoskeletal:   Negative for focal pain or swelling All other systems reviewed and are negative except as documented above in ROS and HPI.  ____________________________________________   PHYSICAL EXAM:  VITAL SIGNS: ED Triage Vitals [04/04/18 1431]  Enc Vitals Group     BP 111/60     Pulse Rate 70     Resp (!) 22     Temp (!) 97.5 F (36.4 C)     Temp Source Oral     SpO2 100 %     Weight 158 lb (71.7 kg)     Height 5\' 7"  (1.702 m)     Head Circumference      Peak Flow      Pain Score 8     Pain Loc      Pain Edu?       Excl. in GC?     Vital signs reviewed, nursing assessments reviewed.   Constitutional:   Alert and oriented. Non-toxic appearance. Eyes:   Conjunctivae are normal. EOMI. PERRL. ENT      Head:   Normocephalic and atraumatic.      Nose:   No congestion/rhinnorhea.       Mouth/Throat:   Dry mucous membranes, no pharyngeal erythema. No peritonsillar mass.       Neck:   No meningismus. Full ROM. Hematological/Lymphatic/Immunilogical:   No cervical lymphadenopathy. Cardiovascular:   RRR. Symmetric bilateral radial and DP pulses.  No murmurs. Cap refill less than 2 seconds. Respiratory:   Normal respiratory effort without tachypnea/retractions. Breath sounds are clear and equal bilaterally. No wheezes/rales/rhonchi. Gastrointestinal:   Soft with bilateral lower quadrant tenderness.. Non distended. There is no CVA tenderness.  No rebound, rigidity, or guarding.  Musculoskeletal:   Normal range of motion in all extremities. No joint effusions.  No lower extremity tenderness.  No edema. Neurologic:   Normal speech and language.  Motor grossly intact. No acute focal neurologic deficits are appreciated.  Skin:    Skin is warm, dry and intact. No rash noted.  No petechiae, purpura, or bullae.  ____________________________________________    LABS (pertinent positives/negatives) (all labs ordered are listed, but only abnormal results are displayed) Labs Reviewed  COMPREHENSIVE METABOLIC PANEL - Abnormal; Notable for the following components:      Result Value   Glucose, Bld 122 (*)    All other components within normal limits  CBC - Abnormal; Notable for the following components:   Hemoglobin 15.2 (*)    All other components within normal limits  URINALYSIS, COMPLETE (UACMP) WITH MICROSCOPIC - Abnormal; Notable for the following components:   Color, Urine YELLOW (*)    APPearance CLEAR (*)    Specific Gravity, Urine >1.046 (*)    Hgb urine dipstick LARGE (*)    Ketones, ur 20 (*)     All other components within normal limits  LIPASE, BLOOD  I-STAT BETA HCG BLOOD, ED ([facility], [facility], [facility] ONLY)  I-STAT BETA HCG BLOOD, ED (NOT ORDERABLE)   ____________________________________________   EKG    ____________________________________________    RADIOLOGY  Ct Abdomen Pelvis W Contrast  Result Date: 04/04/2018 CLINICAL DATA:  Patient reports waking up this morning to lower abdominal pain. History of PCOS and states pain feel like previous cyst ruptures. Patient reports nausea, denies vomiting. States pain started on right side but now has pain on both sides. States she is normally on oral birth control but has missed the last 3 days due to being unable  to get prescription filled. Patient also reports light spotting since this morning. EXAM: CT ABDOMEN AND PELVIS WITH CONTRAST TECHNIQUE: Multidetector CT imaging of the abdomen and pelvis was performed using the standard protocol following bolus administration of intravenous contrast. CONTRAST:  100mL ISOVUE-300 IOPAMIDOL (ISOVUE-300) INJECTION 61% COMPARISON:  None. FINDINGS: Lower chest: Clear lung bases.  Heart normal in size. Hepatobiliary: No focal liver abnormality is seen. No gallstones, gallbladder wall thickening, or biliary dilatation. Pancreas: Unremarkable. No pancreatic ductal dilatation or surrounding inflammatory changes. Spleen: Normal in size without focal abnormality. Adrenals/Urinary Tract: No adrenal masses. Kidneys normal size, orientation and position with symmetric enhancement. 5 mm low-density lesion arises from the upper pole the right kidney, with a smaller similar appearing lesion arising from the lower pole the right kidney, both likely cysts. No other renal masses or lesions, no stones and no hydronephrosis. Ureters normal in course and in caliber. Bladder is unremarkable. Stomach/Bowel: Stomach is unremarkable. Small bowel and colon are normal in caliber. No wall thickening or inflammation. Appendix not definitively  seen. No evidence of appendicitis. Vascular/Lymphatic: No significant vascular findings are present. No enlarged abdominal or pelvic lymph nodes. Reproductive: Uterus and bilateral adnexa are unremarkable. Other: No abdominal wall hernia or abnormality. No abdominopelvic ascites. Musculoskeletal: No acute or significant osseous findings. IMPRESSION: 1. No acute findings. 2. Two subcentimeter low-density right renal lesions, most likely cysts. Exam otherwise unremarkable. Electronically Signed   By: Amie Portlandavid  Ormond M.D.   On: 04/04/2018 19:33   [place] Pelvic Doppler (torsion R/o Or Mass Arterial Flow)  Result Date: 04/04/2018 CLINICAL DATA:  Pelvic pain. EXAM: TRANSABDOMINAL AND TRANSVAGINAL ULTRASOUND OF PELVIS DOPPLER ULTRASOUND OF OVARIES TECHNIQUE: Both transabdominal and transvaginal ultrasound examinations of the pelvis were performed. Transabdominal technique was performed for global imaging of the pelvis including uterus, ovaries, adnexal regions, and pelvic cul-de-sac. It was necessary to proceed with endovaginal exam following the transabdominal exam to visualize the endometrium and ovaries. Color and duplex Doppler ultrasound was utilized to evaluate blood flow to the ovaries. COMPARISON:  None. FINDINGS: Uterus Measurements: 7.9 x 3.8 x 5.0 cm = volume: 81 mL. No fibroids or other mass visualized. Endometrium Thickness: 8 mm.  No focal abnormality visualized. Right ovary Measurements: 3.0 x 2.4 x 2.2 cm = volume: 8.3 mL. Normal appearance/no adnexal mass. Left ovary Measurements: 3.4 x 2.3 x 3.3 cm = volume: 13.6 mL. Normal appearance/no adnexal mass. Pulsed Doppler evaluation of both ovaries demonstrates normal low-resistance arterial and venous waveforms. Other findings No abnormal free fluid. IMPRESSION: No pelvic mass or other significant abnormality identified. No sonographic evidence for ovarian torsion. Electronically Signed   By: Myles RosenthalJohn  Stahl M.D.   On: 04/04/2018 16:00   [place] Pelvic Complete With  Transvaginal  Result Date: 04/04/2018 CLINICAL DATA:  Pelvic pain. EXAM: TRANSABDOMINAL AND TRANSVAGINAL ULTRASOUND OF PELVIS DOPPLER ULTRASOUND OF OVARIES TECHNIQUE: Both transabdominal and transvaginal ultrasound examinations of the pelvis were performed. Transabdominal technique was performed for global imaging of the pelvis including uterus, ovaries, adnexal regions, and pelvic cul-de-sac. It was necessary to proceed with endovaginal exam following the transabdominal exam to visualize the endometrium and ovaries. Color and duplex Doppler ultrasound was utilized to evaluate blood flow to the ovaries. COMPARISON:  None. FINDINGS: Uterus Measurements: 7.9 x 3.8 x 5.0 cm = volume: 81 mL. No fibroids or other mass visualized. Endometrium Thickness: 8 mm.  No focal abnormality visualized. Right ovary Measurements: 3.0 x 2.4 x 2.2 cm = volume: 8.3 mL. Normal appearance/no adnexal mass. Left ovary  Measurements: 3.4 x 2.3 x 3.3 cm = volume: 13.6 mL. Normal appearance/no adnexal mass. Pulsed Doppler evaluation of both ovaries demonstrates normal low-resistance arterial and venous waveforms. Other findings No abnormal free fluid. IMPRESSION: No pelvic mass or other significant abnormality identified. No sonographic evidence for ovarian torsion. Electronically Signed   By: Myles Rosenthal M.D.   On: 04/04/2018 16:00    ____________________________________________   PROCEDURES Procedures  ____________________________________________  DIFFERENTIAL DIAGNOSIS   Ovarian cyst, torsion, appendicitis, colitis, BV, cystitis, dysmenorrhea  CLINICAL IMPRESSION / ASSESSMENT AND PLAN / ED COURSE  Pertinent labs & imaging results that were available during my care of the patient were reviewed by me and considered in my medical decision making (see chart for details).    Patient presents with severe pelvic pain.  Possibly dysmenorrhea due to the issue with her oral contraceptives, but with severity, pelvic ultrasound was  undertaken to evaluate for ovarian cyst or signs of torsion.  Clinical Course as of Apr 04 2225  Wynelle Link Apr 04, 2018  1802 US PELVIC COMPLETE WITH TRANSVAGINAL [PS]    Clinical Course User Index [PS] Sharman Cheek, MD    ----------------------------------------- 10:34 PM on 04/04/2018 -----------------------------------------  Pelvic ultrasound was normal.  No free fluid or cyst, good blood flow to both ovaries.  Patient was still very tender in bilateral lower quadrants without peritoneal signs.  Not specific to McBurney's point but with the degree of her pain, more severe than anything she is ever experienced, CT scan was obtained to evaluate for appendicitis or other occult disease.  This turned out to be okay.  Urinalysis did show ketones and highly concentrated urine consistent with dehydration.  After IV fluids the patient is feeling much better, tolerating oral intake and wanting to go home.  I will keep her on Toradol and Zofran for symptom relief, recommend follow-up with primary care doctor this week.  Labs and vital signs are unremarkable.  Possibility doing a pelvic exam was discussed with the patient and offered, but she does not feel that that is necessary at this time and prefers to defer.   ____________________________________________   FINAL CLINICAL IMPRESSION(S) / ED DIAGNOSES    Final diagnoses:  Abdominal pain  Lower abdominal pain  Dehydration     ED Discharge Orders         Ordered    ketorolac (TORADOL) 10 MG tablet  Every 6 hours PRN     04/04/18 2225    ondansetron (ZOFRAN ODT) 4 MG disintegrating tablet  Every 8 hours PRN     04/04/18 2225          Portions of this note were generated with dragon dictation software. Dictation errors may occur despite best attempts at proofreading.    Sharman Cheek, MD 04/04/18 2235

## 2018-04-04 NOTE — ED Notes (Signed)
Patient requesting to speak to provider. Patient informed that MD would be in as soon as able. Patient verbalized understanding.

## 2018-04-06 ENCOUNTER — Encounter: Payer: Self-pay | Admitting: Family Medicine

## 2018-04-06 ENCOUNTER — Other Ambulatory Visit: Payer: Self-pay

## 2018-04-06 ENCOUNTER — Ambulatory Visit (INDEPENDENT_AMBULATORY_CARE_PROVIDER_SITE_OTHER): Payer: Managed Care, Other (non HMO) | Admitting: Family Medicine

## 2018-04-06 VITALS — BP 100/72 | HR 80 | Temp 98.5°F | Ht 67.0 in | Wt 158.1 lb

## 2018-04-06 DIAGNOSIS — R1030 Lower abdominal pain, unspecified: Secondary | ICD-10-CM

## 2018-04-06 DIAGNOSIS — L299 Pruritus, unspecified: Secondary | ICD-10-CM

## 2018-04-06 DIAGNOSIS — E282 Polycystic ovarian syndrome: Secondary | ICD-10-CM | POA: Diagnosis not present

## 2018-04-06 DIAGNOSIS — F419 Anxiety disorder, unspecified: Secondary | ICD-10-CM | POA: Diagnosis not present

## 2018-04-06 DIAGNOSIS — G4700 Insomnia, unspecified: Secondary | ICD-10-CM

## 2018-04-06 MED ORDER — ESZOPICLONE 3 MG PO TABS: 3.0000 mg | ORAL_TABLET | Freq: Every day | ORAL | 1 refills | Status: DC

## 2018-04-06 MED ORDER — METFORMIN HCL ER 500 MG PO TB24: 500.0000 mg | ORAL_TABLET | Freq: Every day | ORAL | 1 refills | Status: DC

## 2018-04-06 NOTE — Progress Notes (Signed)
Subjective:    Patient ID: [name], female    DOB: January 14, 1991, 27 y.o.   MRN: [id number]  HPI  Presents to clinic for ER follow up. Was seen at ER due to ABD pain.  Patient does have a history of PCOS and dysmenorrhea.  Patient states she was on vacation last week and there was an issue with her birth control tablets, she got off schedule with her dosing and missed a few days.  Patient denies any possibility of STDs.  Appendicitis was ruled out by lab work and imaging in ER.  After all testing was complete, it is believed that patient possibly had a ruptured ovarian cyst that could have caused pain.  Patient was prescribed Zofran and Toradol to use as needed for pain and symptom relief.  Patient states she has had ruptured ovarian cyst in the past and the pain she felt that brought her to the ER was very similar.  Patient states the pain is somewhat improved today, but continues to have some right-sided lower quadrant pain.  Patient is slowly starting to eat small amounts of foods and has been trying to keep her liquid intake up.  ER labs and imaging all reviewed: CBC Latest Ref Rng & Units 04/04/2018  WBC 4.0 - 10.5 K/uL 9.1  Hemoglobin 12.0 - 15.0 g/dL 15.2(H)  Hematocrit 36.0 - 46.0 % 45.0  Platelets 150 - 400 K/uL 316   CMP Latest Ref Rng & Units 04/04/2018  Glucose 70 - 99 mg/dL 409(W)  BUN 6 - 20 mg/dL 11  Creatinine 1.19 - 1.47 mg/dL 8.29  Sodium 562 - 130 mmol/L 138  Potassium 3.5 - 5.1 mmol/L 3.8  Chloride 98 - 111 mmol/L 104  CO2 22 - 32 mmol/L 23  Calcium 8.9 - 10.3 mg/dL 9.1  Total Protein 6.5 - 8.1 g/dL 7.4  Total Bilirubin 0.3 - 1.2 mg/dL 0.9  Alkaline Phos 38 - 126 U/L 55  AST 15 - 41 U/L 20  ALT 0 - 44 U/L 28   Lipase normal  CT ABD pelvis reviewed: 2 right renal cysts, but otherwise unremarkable  US pelvis reviewed with transvaginal & doppler : No pelvic mass or other significant abnormality identified. No sonographic evidence for ovarian torsion  Patient  also continues to have issues with insomnia.  Patient did take Belsomra, which has been effective for her in the past but it has not been helping recently.  Patient has tried [place] in her teen years, around age 43, and did remember having some decent effect.  Patient has tried melatonin in the past with no effect and also has tried Ambien in the past, Ambien made her feel like she was having hallucinations.  Patient states so far she has been tolerating Wellbutrin without any issues.  Mood seems somewhat improved on Wellbutrin dose.  Wellbutrin does not make her feel as exhausted like the Lexapro did. Denies SI or HI.  Patient also complains of diffuse itching all over her body ever since being in the ER, thinks it was most likely related to the IV dye that was injected for her CT scan.  Patient Active Problem List   Diagnosis Date Noted  . Anxiety 12/30/2017  . Contraception 12/30/2017  . PCOS (polycystic ovarian syndrome) 12/30/2017  . Insomnia 12/30/2017  . Encounter for initial prescription of contraceptive pills 09/18/2016  . Rash 09/18/2016  . History of UTI 06/17/2016  . Heel pain 01/15/2016   Social History   Tobacco Use  .  Smoking status: Never Smoker  . Smokeless tobacco: Never Used  Substance Use Topics  . Alcohol use: Yes    Review of Systems  Constitutional: Negative for chills, fatigue and fever.  HENT: Negative for congestion, ear pain, sinus pain and sore throat.   Eyes: Negative.   Respiratory: Negative for cough, shortness of breath and wheezing.   Cardiovascular: Negative for chest pain, palpitations and leg swelling.  Gastrointestinal: Positive ABD pain, some nausea. No vomiting or diarrhea.  Genitourinary: Negative for dysuria, frequency and urgency.  Musculoskeletal: Negative for arthralgias and myalgias.  Skin: Negative for color change, pallor and rash.  Neurological: Negative for syncope, light-headedness and headaches.  Psychiatric/Behavioral: The  patient is not nervous/anxious.       Objective:   Physical Exam  Constitutional: She appears well-developed and well-nourished. No distress.  HENT:  Head: Normocephalic and atraumatic.  Eyes: Pupils are equal, round, and reactive to light. EOM are normal. No scleral icterus.  Neck: Normal range of motion. Neck supple. No tracheal deviation present.  Cardiovascular: Normal rate, regular rhythm and normal heart sounds.  Pulmonary/Chest: Effort normal and breath sounds normal. No respiratory distress. She has no wheezes. She has no rales.  Abdominal: Soft. Bowel sounds are normal.  Some right lower quadrant suprapubic abdominal tenderness. Neurological: She is alert and oriented to person, place, and time.  Gait normal  Skin: Skin is warm and dry. No pallor. No obvious rash or redness of skin.  Psychiatric: She has a normal mood and affect. Her behavior is normal. Thought content normal.  Nursing note and vitals reviewed.   Vitals:   04/06/18 0904  BP: 100/72  Pulse: 80  Temp: 98.5 F (36.9 C)  SpO2: 98%      Assessment & Plan:   Lower abdominal pain/PCOS-due to patient's history of PCOS and suspected ruptured ovarian cyst as cause of pain due to rest of abdominal work-up being unremarkable we will do GYN referral.  Patient also will begin low-dose metformin to see if this helps overall improve her PCOS symptoms and episodes of abdominal pain.  Patient encouraged to remember to take her birth control every day as Meziane dose seems to really worsen her PCOS symptoms and abdominal pains.  Insomnia/anxiety- insomnia still present, Belsomra does not seem to be helping.  Patient will trial Lunesta instead of Belsomra.  Patient did take Lunesta in the past around age 27, but has not used this drug in many years.  Anxiety seems somewhat better on Wellbutrin, Wellbutrin has been much better tolerated than the Lexapro, she does not feel extremely fatigued like she felt while taking the  Lexapro.  Itching-suspect that the itching could be related to the IV dye or a contact dermatitis from the sheets/bedding in the hospital.  Patient will take antihistamine daily, has Zyrtec at home and also advised to trial using her hydroxyzine as needed as this medication help with both anxiety and itching.  Patient will follow-up here in 3 to 4 weeks for recheck on mood and insomnia with medication changes.  Patient advised to return to clinic sooner if any issues arise.

## 2018-04-13 ENCOUNTER — Ambulatory Visit (INDEPENDENT_AMBULATORY_CARE_PROVIDER_SITE_OTHER): Payer: Managed Care, Other (non HMO) | Admitting: Obstetrics and Gynecology

## 2018-04-13 ENCOUNTER — Encounter: Payer: Self-pay | Admitting: Obstetrics and Gynecology

## 2018-04-13 VITALS — BP 102/7 | HR 94 | Ht 67.0 in | Wt 153.8 lb

## 2018-04-13 DIAGNOSIS — R102 Pelvic and perineal pain: Secondary | ICD-10-CM | POA: Diagnosis not present

## 2018-04-13 NOTE — Progress Notes (Signed)
HPI:      [name] is a 27 y.o. G0P0000 who LMP was Patient's last menstrual period was 04/04/2018 (exact date).  Subjective:   She presents today after being seen in the emergency department over the weekend for severe pelvic pain.  A CT and ultrasound performed at that time revealed no abnormalities. Patient recently came back from a cruise and she forgot her birth control pills on the cruise ship.  She stopped in the middle of her pack.  A few days later she began having severe pelvic cramping to the point that she called EMS and presented to the emergency department.  No abnormal findings were noted and she was discharged home on pain medication.  Later that evening she began having heavy vaginal bleeding consistent with her menstrual period.  But heavier than usual. At this point her pain is resolving daily and she has a small amount of pelvic pressure pain today but nothing severe. She has resumed her OCPs starting a new pack. Patient has a remote history of PCO.  But was empirically started on metformin just a few weeks ago for "hormonal control".  Her ultrasound and CAT scan revealed no evidence of ovarian cysts. Patient reports issues with her appetite being decreased and generally feeling somewhat lethargic over the last 2 weeks.    Hx: The following portions of the patient's history were reviewed and updated as appropriate:             She  has a past medical history of Asthma, PCOS (polycystic ovarian syndrome), and UTI (urinary tract infection). She does not have any pertinent problems on file. She  has a past surgical history that includes Breast enhancement surgery (2012); Tonsillectomy; and Shoulder surgery. Her family history includes Arthritis in her mother; Diabetes in her father and mother; Heart disease in her mother; Hypertension in her mother; Mental illness in her father and mother; Stroke in her mother. She  reports that she has never smoked. She has never used  smokeless tobacco. She reports previous alcohol use. She reports that she does not use drugs. She has a current medication list which includes the following prescription(s): bupropion, drospirenone-ethinyl estradiol, eszopiclone, hydroxyzine, ketorolac, and metformin. She is allergic to aspirin and levaquin [levofloxacin in d5w].       Review of Systems:  Review of Systems  Constitutional: Denied constitutional symptoms, night sweats, recent illness, fatigue, fever, insomnia and weight loss.  Eyes: Denied eye symptoms, eye pain, photophobia, vision change and visual disturbance.  Ears/Nose/Throat/Neck: Denied ear, nose, throat or neck symptoms, hearing loss, nasal discharge, sinus congestion and sore throat.  Cardiovascular: Denied cardiovascular symptoms, arrhythmia, chest pain/pressure, edema, exercise intolerance, orthopnea and palpitations.  Respiratory: Denied pulmonary symptoms, asthma, pleuritic pain, productive sputum, cough, dyspnea and wheezing.  Gastrointestinal: Denied, gastro-esophageal reflux, melena, nausea and vomiting.  Genitourinary: See HPI for additional information.  Musculoskeletal: Denied musculoskeletal symptoms, stiffness, swelling, muscle weakness and myalgia.  Dermatologic: Denied dermatology symptoms, rash and scar.  Neurologic: Denied neurology symptoms, dizziness, headache, neck pain and syncope.  Psychiatric: Denied psychiatric symptoms, anxiety and depression.  Endocrine: Denied endocrine symptoms including hot flashes and night sweats.   Meds:   Current Outpatient Medications on File Prior to Visit  Medication Sig Dispense Refill  . buPROPion (WELLBUTRIN XL) 150 MG 24 hr tablet Take 1 tablet (150 mg total) by mouth daily. 30 tablet 2  . drospirenone-ethinyl estradiol (YAZ,GIANVI,LORYNA) 3-0.02 MG tablet Take 1 tablet by mouth daily. 3 Package 3  .  Eszopiclone (ESZOPICLONE) 3 MG TABS Take 1 tablet (3 mg total) by mouth at bedtime. Take immediately before  bedtime 30 tablet 1  . hydrOXYzine (ATARAX/VISTARIL) 10 MG tablet Take 1 tablet (10 mg total) by mouth 3 (three) times daily as needed for anxiety. 30 tablet 2  . ketorolac (TORADOL) 10 MG tablet Take 1 tablet (10 mg total) by mouth every 6 (six) hours as needed for moderate pain. 12 tablet 0  . metFORMIN (GLUCOPHAGE XR) 500 MG 24 hr tablet Take 1 tablet (500 mg total) by mouth daily with breakfast. 90 tablet 1   No current facility-administered medications on file prior to visit.     Objective:     Vitals:   04/13/18 1507  BP: (!) 102/7  Pulse: 94              Ultrasound and CT results reviewed directly with the patient findings discussed.  Assessment:    G0P0000 Patient Active Problem List   Diagnosis Date Noted  . Anxiety 12/30/2017  . Contraception 12/30/2017  . PCOS (polycystic ovarian syndrome) 12/30/2017  . Insomnia 12/30/2017  . Encounter for initial prescription of contraceptive pills 09/18/2016  . Rash 09/18/2016  . History of UTI 06/17/2016  . Heel pain 01/15/2016     1. Pelvic pain in female     No abnormalities noted on ultrasound or CT.  Pain is resolving daily.  Pain has been associated with significant vaginal bleeding. Her appetite changes may be secondary to metformin.   Plan:            1.  Continue OCPs  2.  Advise discontinuation of metformin while taking OCPs as patient has never been diagnosed with insulin resistance and that may be causing some of her other accessory symptoms.  3.  I believe with expectant management and restarting her OCPs she will again have normal regular cycles without further incident. Orders No orders of the defined types were placed in this encounter.   No orders of the defined types were placed in this encounter.     F/U  Return for Annual Physical. I spent 32 minutes involved in the care of this patient of which greater than 50% was spent discussing her emergency department visit, ultrasound and CT scan, pelvic  pain, PCO and use of OCPs.  Metformin and some of its side effects as well as its usefulness for insulin resistance.  How to diagnose and manage insulin resistance.  Future fertility discussed.  All questions answered.    Elonda Husky, M.D. 04/13/2018 4:49 PM

## 2018-04-19 ENCOUNTER — Encounter: Payer: Self-pay | Admitting: Family Medicine

## 2018-04-19 ENCOUNTER — Ambulatory Visit (INDEPENDENT_AMBULATORY_CARE_PROVIDER_SITE_OTHER): Payer: Managed Care, Other (non HMO) | Admitting: Family Medicine

## 2018-04-19 VITALS — BP 110/78 | HR 82 | Temp 98.4°F | Ht 67.0 in | Wt 152.6 lb

## 2018-04-19 DIAGNOSIS — G4700 Insomnia, unspecified: Secondary | ICD-10-CM

## 2018-04-19 DIAGNOSIS — E282 Polycystic ovarian syndrome: Secondary | ICD-10-CM | POA: Diagnosis not present

## 2018-04-19 DIAGNOSIS — R1030 Lower abdominal pain, unspecified: Secondary | ICD-10-CM | POA: Diagnosis not present

## 2018-04-19 DIAGNOSIS — K5900 Constipation, unspecified: Secondary | ICD-10-CM | POA: Diagnosis not present

## 2018-04-19 MED ORDER — DOCUSATE SODIUM 100 MG PO CAPS: ORAL_CAPSULE | ORAL | 2 refills | Status: DC

## 2018-04-19 NOTE — Progress Notes (Signed)
Subjective:    Patient ID: [name], female    DOB: [birth year], 27 y.o.   MRN: [id number]  HPI  Presents to clinic for follow up on insomnia since using Lunesta in place of Belsomra.  Overall she feels the Alfonso PattenLunesta has worked better than the Belsomra to help her sleep.  She has been getting more sleep, but still does struggle with insomnia.  Patient has a wind down routine and tries to not use any TV or other electronics at least 30 minutes before going to bed.  Patient did see GYN for her PCOS.  Prior to seeing the GYN, she had stopped the metformin on her own due to it causing some constipation.  GYN also recommended that he did not feel metformin was neccessary for her PCOS so stopping is OK.  GYN did recommend patient continue her birth control as already prescribed.  Patient states overall her lower abdominal pain seems to be slowly improving, only issue now is some constipation.  Patient Active Problem List   Diagnosis Date Noted  . Anxiety 12/30/2017  . Contraception 12/30/2017  . PCOS (polycystic ovarian syndrome) 12/30/2017  . Insomnia 12/30/2017  . Encounter for initial prescription of contraceptive pills 09/18/2016  . Rash 09/18/2016  . History of UTI 06/17/2016  . Heel pain 01/15/2016   Social History   Tobacco Use  . Smoking status: Never Smoker  . Smokeless tobacco: Never Used  Substance Use Topics  . Alcohol use: Not Currently    Review of Systems  Constitutional: Negative for chills, fatigue and fever.  HENT: Negative for congestion, ear pain, sinus pain and sore throat.   Eyes: Negative.   Respiratory: Negative for cough, shortness of breath and wheezing.   Cardiovascular: Negative for chest pain, palpitations and leg swelling.  Gastrointestinal: Negative for abdominal pain, diarrhea, nausea and vomiting. +constipation Genitourinary: Negative for dysuria, frequency and urgency.  Musculoskeletal: Negative for arthralgias and myalgias.  Skin: Negative  for color change, pallor and rash.  Neurological: Negative for syncope, light-headedness and headaches.  Psychiatric/Behavioral: The patient is not nervous/anxious.       Objective:   Physical Exam   Constitutional: She appears well-developed and well-nourished. No distress.  HENT:  Head: Normocephalic and atraumatic.  Eyes: Pupils are equal, round, and reactive to light. EOM are normal. No scleral icterus.  Neck: Normal range of motion. Neck supple. No tracheal deviation present.  Cardiovascular: Normal rate, regular rhythm and normal heart sounds.  Pulmonary/Chest: Effort normal and breath sounds normal. No respiratory distress. She has no wheezes. She has no rales.  Abdominal: Soft. Bowel sounds are normal. There is no tenderness.  Neurological: She is alert and oriented to person, place, and time. Gait normal  Skin: Skin is warm and dry. No pallor.  Psychiatric: She has a normal mood and affect. Her behavior is normal. Thought content normal.   Nursing note and vitals reviewed.  Vitals:   04/19/18 0912  BP: 110/78  Pulse: 82  Temp: 98.4 F (36.9 C)  SpO2: 98%      Assessment & Plan:   Insomnia - Alfonso PattenLunesta has been more beneficial to patient than Belsomra.  Patient will continue using Lunesta as needed to help sleep.  Patient will continue her good sleep hygiene habits.  Lower abdominal pain, PCOS, constipation - lower abdominal pain has slowly begun to resolve.  Patient will continue her birth control as prescribed to help control her PCOS symptoms.  Patient will use Colace 200 mg once  per day for the next few days to help soften stool, then she will reduce dose to 100 mg/day and that she has been advised to discontinue Colace if needed when when and if stools become too soft.  Patient will follow-up here in approximately 2 months for recheck on chronic conditions.  She is aware that she can call at any time for sooner appointment and if she needs medication refills.

## 2018-04-29 ENCOUNTER — Emergency Department
Admission: EM | Admit: 2018-04-29 | Discharge: 2018-04-29 | Disposition: A | Discharge status: Home / Self Care | Payer: Managed Care, Other (non HMO) | Attending: Emergency Medicine | Admitting: Emergency Medicine

## 2018-04-29 ENCOUNTER — Other Ambulatory Visit: Payer: Self-pay

## 2018-04-29 DIAGNOSIS — Z7984 Long term (current) use of oral hypoglycemic drugs: Secondary | ICD-10-CM | POA: Diagnosis not present

## 2018-04-29 DIAGNOSIS — K644 Residual hemorrhoidal skin tags: Secondary | ICD-10-CM

## 2018-04-29 DIAGNOSIS — Z79899 Other long term (current) drug therapy: Secondary | ICD-10-CM | POA: Diagnosis not present

## 2018-04-29 DIAGNOSIS — J45909 Unspecified asthma, uncomplicated: Secondary | ICD-10-CM | POA: Insufficient documentation

## 2018-04-29 DIAGNOSIS — K6289 Other specified diseases of anus and rectum: Secondary | ICD-10-CM | POA: Diagnosis present

## 2018-04-29 DIAGNOSIS — K5901 Slow transit constipation: Secondary | ICD-10-CM | POA: Diagnosis not present

## 2018-04-29 MED ORDER — LIDOCAINE 0.5 % EX GEL: 5.0000 mL | Freq: Two times a day (BID) | CUTANEOUS | 1 refills | Status: DC

## 2018-04-29 MED ORDER — HYDROCORTISONE ACETATE 25 MG RE SUPP: 25.0000 mg | Freq: Two times a day (BID) | RECTAL | 1 refills | Status: AC

## 2018-04-29 MED ORDER — HYDROMORPHONE HCL 1 MG/ML IJ SOLN
1.0000 mg | Freq: Once | INTRAMUSCULAR | Status: AC
  Administered 2018-04-29: 1 mg via INTRAMUSCULAR
  Filled 2018-04-29: qty 1

## 2018-04-29 MED ORDER — ONDANSETRON 8 MG PO TBDP
8.0000 mg | ORAL_TABLET | Freq: Once | ORAL | Status: AC
  Administered 2018-04-29: 8 mg via ORAL
  Filled 2018-04-29: qty 1

## 2018-04-29 MED ORDER — LIDOCAINE HCL URETHRAL/MUCOSAL 2 % EX GEL
1.0000 "application " | Freq: Once | CUTANEOUS | Status: AC
  Administered 2018-04-29: 1 via TOPICAL
  Filled 2018-04-29: qty 5

## 2018-04-29 NOTE — Discharge Instructions (Signed)
Continue previous stool softener.  Follow discharge care instructions.  If no improvement in 1 week contact the surgical department and tell them you follow-up from the emergency room.

## 2018-04-29 NOTE — ED Provider Notes (Signed)
[facility] Emergency Department Provider Note   ____________________________________________   First MD Initiated Contact with Patient 04/29/18 1113     (approximate)  I have reviewed the triage vital signs and the nursing notes.   HISTORY  Chief Complaint Hemorrhoids    HPI [name] is a 28 y.o. female patient presents to the ED after being sent from urgent care clinic secondary to a painful large rectal hemorrhoid.  Patient stated complaint started 3 days ago.  Patient also has a history of constipation.  Patient is currently taking stool softener.  Patient rates pain as a 5/10.  Patient described the pain is "achy".  Patient stated no relief with over-the-counter rectal cream.  Past Medical History:  Diagnosis Date  . Asthma   . PCOS (polycystic ovarian syndrome)   . UTI (urinary tract infection)     Patient Active Problem List   Diagnosis Date Noted  . Anxiety 12/30/2017  . Contraception 12/30/2017  . PCOS (polycystic ovarian syndrome) 12/30/2017  . Insomnia 12/30/2017  . Encounter for initial prescription of contraceptive pills 09/18/2016  . Rash 09/18/2016  . History of UTI 06/17/2016  . Heel pain 01/15/2016    Past Surgical History:  Procedure Laterality Date  . BREAST ENHANCEMENT SURGERY  2012  . SHOULDER SURGERY    . TONSILLECTOMY      Prior to Admission medications   Medication Sig Start Date End Date Taking? Authorizing Provider  buPROPion (WELLBUTRIN XL) 150 MG 24 hr tablet Take 1 tablet (150 mg total) by mouth daily. 02/15/18   Tracey Harries, FNP  docusate sodium (COLACE) 100 MG capsule Take 2 capsules daily to help soften stool. If you notice stool too soft, can reduce dose to 1 capsule daily. 04/19/18   Tracey Harries, FNP  drospirenone-ethinyl estradiol (YAZ,GIANVI,LORYNA) 3-0.02 MG tablet Take 1 tablet by mouth daily. 12/29/17   Tracey Harries, FNP  Eszopiclone (ESZOPICLONE) 3 MG TABS Take 1 tablet (3 mg total) by  mouth at bedtime. Take immediately before bedtime 04/06/18   Guse, Janna Arch, FNP  hydrocortisone (ANUSOL-HC) 25 MG suppository Place 1 suppository (25 mg total) rectally every 12 (twelve) hours. 04/29/18 04/29/19  Joni Reining, PA-C  hydrOXYzine (ATARAX/VISTARIL) 10 MG tablet Take 1 tablet (10 mg total) by mouth 3 (three) times daily as needed for anxiety. 12/29/17   Tracey Harries, FNP  ketorolac (TORADOL) 10 MG tablet Take 1 tablet (10 mg total) by mouth every 6 (six) hours as needed for moderate pain. 04/04/18   Sharman Cheek, MD  Lidocaine 0.5 % GEL Apply 5 mLs topically 2 (two) times daily. Apply when inserting suppositories. 04/29/18   Joni Reining, PA-C  metFORMIN (GLUCOPHAGE XR) 500 MG 24 hr tablet Take 1 tablet (500 mg total) by mouth daily with breakfast. 04/06/18   Guse, Janna Arch, FNP    Allergies Aspirin and Levaquin [levofloxacin in d5w]  Family History  Problem Relation Age of Onset  . Arthritis Mother   . Heart disease Mother   . Stroke Mother   . Hypertension Mother   . Mental illness Mother   . Diabetes Mother   . Mental illness Father   . Diabetes Father   . Breast cancer Neg Hx   . Ovarian cancer Neg Hx   . Colon cancer Neg Hx     Social History Social History   Tobacco Use  . Smoking status: Never Smoker  . Smokeless tobacco: Never Used  Substance Use  Topics  . Alcohol use: Not Currently  . Drug use: No    Review of Systems  Constitutional: No fever/chills Eyes: No visual changes. ENT: No sore throat. Cardiovascular: Denies chest pain. Respiratory: Denies shortness of breath. Gastrointestinal: No abdominal pain.  No nausea, no vomiting.  No diarrhea.  No constipation.  Rectal hemorrhoid. Genitourinary: Negative for dysuria. Musculoskeletal: Negative for back pain. Skin: Negative for rash. Neurological: Negative for headaches, focal weakness or numbness. Hematological/Lymphatic: Allergic/Immunilogical: Aspirin and  Levaquin. ____________________________________________   PHYSICAL EXAM:  VITAL SIGNS: ED Triage Vitals  Enc Vitals Group     BP 04/29/18 1055 115/68     Pulse Rate 04/29/18 1055 (!) 108     Resp 04/29/18 1055 20     Temp 04/29/18 1055 98.2 F (36.8 C)     Temp Source 04/29/18 1055 Oral     SpO2 04/29/18 1055 99 %     Weight 04/29/18 1035 152 lb 9.6 oz (69.2 kg)     Height 04/29/18 1035 5\' 7"  (1.702 m)     Head Circumference --      Peak Flow --      Pain Score --      Pain Loc --      Pain Edu? --      Excl. in GC? --    Constitutional: Alert and oriented. Well appearing and in no acute distress. Cardiovascular: Normal rate, regular rhythm. Grossly normal heart sounds.  Good peripheral circulation. Respiratory: Normal respiratory effort.  No retractions. Lungs CTAB. Gastrointestinal: Soft and nontender. No distention. No abdominal bruits. No CVA tenderness.  Single nonthrombosed hemorrhoid tissue protruding from rectum. Neurologic:  Normal speech and language. No gross focal neurologic deficits are appreciated. No gait instability. Skin:  Skin is warm, dry and intact. No rash noted. Psychiatric: Mood and affect are normal. Speech and behavior are normal.  ____________________________________________   LABS (all labs ordered are listed, but only abnormal results are displayed)  Labs Reviewed - No data to display ____________________________________________  EKG   ____________________________________________  RADIOLOGY  ED MD interpretation:    Official radiology report(s): No results found.  ____________________________________________   PROCEDURES  Procedure(s) performed: None  Procedures  Critical Care performed: No  ____________________________________________   INITIAL IMPRESSION / ASSESSMENT AND PLAN / ED COURSE  As part of my medical decision making, I reviewed the following data within the electronic MEDICAL RECORD NUMBER    Patient directed  hemorrhoid manually reduce using lidocaine gel.  Patient given discharge care instruction advised to continue stool softener.  Advised to follow-up with surgical department if no improvement in 1 week.  Return to ED if condition worsens.      ____________________________________________   FINAL CLINICAL IMPRESSION(S) / ED DIAGNOSES  Final diagnoses:  External hemorrhoid  Constipation by delayed colonic transit     ED Discharge Orders         Ordered    hydrocortisone (ANUSOL-HC) 25 MG suppository  Every 12 hours     04/29/18 1149    Lidocaine 0.5 % GEL  2 times daily     04/29/18 1149           Note:  This document was prepared using Dragon voice recognition software and may include unintentional dictation errors.    Joni Reining, PA-C 04/29/18 1154    Emily Filbert, MD 04/29/18 [id number]

## 2018-04-29 NOTE — ED Notes (Signed)
See triage note  Presents with poss.hemorrhoids  [facility] atates she need to have area evaluated here and for treatment

## 2018-04-29 NOTE — ED Triage Notes (Signed)
Pt sent from [facility] for treatment of painful enlarged hemorrhoids.

## 2018-05-07 ENCOUNTER — Ambulatory Visit: Payer: Managed Care, Other (non HMO) | Admitting: Family Medicine

## 2018-06-06 ENCOUNTER — Other Ambulatory Visit: Payer: Self-pay | Admitting: Family Medicine

## 2018-06-06 DIAGNOSIS — F419 Anxiety disorder, unspecified: Secondary | ICD-10-CM

## 2018-06-15 ENCOUNTER — Ambulatory Visit: Payer: Managed Care, Other (non HMO) | Admitting: Obstetrics and Gynecology

## 2018-06-15 ENCOUNTER — Ambulatory Visit: Payer: Managed Care, Other (non HMO) | Admitting: Family Medicine

## 2018-06-15 ENCOUNTER — Encounter: Payer: Self-pay | Admitting: Obstetrics and Gynecology

## 2018-06-15 VITALS — BP 106/71 | HR 103 | Ht 67.0 in | Wt 146.5 lb

## 2018-06-15 DIAGNOSIS — N764 Abscess of vulva: Secondary | ICD-10-CM | POA: Diagnosis not present

## 2018-06-15 NOTE — Progress Notes (Signed)
Patient comes in today for a clitoral abscess. She was  Seen at urgent care on 06/08/2018 out of town and was put on antibiotics. Patient states that it is much better.

## 2018-06-15 NOTE — Progress Notes (Signed)
HPI:      [name] is a 28 y.o. G0P0000 who LMP was Patient's last menstrual period was 06/15/2018.  Subjective:   She presents today for ED follow-up.  She was diagnosed with a clitoral abscess and has taken clindamycin for more than a week.  She reports that it is resolved. She would also like to discuss symptoms of constipation and diarrhea which seem to alternate.  This has been a problem since her cruise in December.  Of significant note her brother has Crohn's disease. She says she is going to schedule an appointment with her family doctor with strong desire to have referral to GI.  I believe this is appropriate.    Hx: The following portions of the patient's history were reviewed and updated as appropriate:             She  has a past medical history of Asthma, PCOS (polycystic ovarian syndrome), and UTI (urinary tract infection). She does not have any pertinent problems on file. She  has a past surgical history that includes Breast enhancement surgery (2012); Tonsillectomy; and Shoulder surgery. Her family history includes Arthritis in her mother; Diabetes in her father and mother; Heart disease in her mother; Hypertension in her mother; Mental illness in her father and mother; Stroke in her mother. She  reports that she has never smoked. She has never used smokeless tobacco. She reports previous alcohol use. She reports that she does not use drugs. She has a current medication list which includes the following prescription(s): bupropion, docusate sodium, drospirenone-ethinyl estradiol, eszopiclone, hydrocortisone, hydroxyzine, ketorolac, lidocaine, and metformin. She is allergic to aspirin and levaquin [levofloxacin in d5w].       Review of Systems:  Review of Systems  Constitutional: Denied constitutional symptoms, night sweats, recent illness, fatigue, fever, insomnia and weight loss.  Eyes: Denied eye symptoms, eye pain, photophobia, vision change and visual disturbance.   Ears/Nose/Throat/Neck: Denied ear, nose, throat or neck symptoms, hearing loss, nasal discharge, sinus congestion and sore throat.  Cardiovascular: Denied cardiovascular symptoms, arrhythmia, chest pain/pressure, edema, exercise intolerance, orthopnea and palpitations.  Respiratory: Denied pulmonary symptoms, asthma, pleuritic pain, productive sputum, cough, dyspnea and wheezing.  Gastrointestinal: Denied, gastro-esophageal reflux, melena, nausea and vomiting.  Genitourinary:. Denied genitourinary symptoms including symptomatic vaginal discharge, pelvic relaxation issues, and urinary complaints.  Musculoskeletal: Denied musculoskeletal symptoms, stiffness, swelling, muscle weakness and myalgia.  Dermatologic: Denied dermatology symptoms, rash and scar.  Neurologic: Denied neurology symptoms, dizziness, headache, neck pain and syncope.  Psychiatric: Denied psychiatric symptoms, anxiety and depression.  Endocrine: Denied endocrine symptoms including hot flashes and night sweats.   Meds:   Current Outpatient Medications on File Prior to Visit  Medication Sig Dispense Refill  . buPROPion (WELLBUTRIN XL) 150 MG 24 hr tablet TAKE 1 TABLET(150 MG) BY MOUTH DAILY 30 tablet 2  . docusate sodium (COLACE) 100 MG capsule Take 2 capsules daily to help soften stool. If you notice stool too soft, can reduce dose to 1 capsule daily. 60 capsule 2  . drospirenone-ethinyl estradiol (YAZ,GIANVI,LORYNA) 3-0.02 MG tablet Take 1 tablet by mouth daily. 3 Package 3  . Eszopiclone (ESZOPICLONE) 3 MG TABS Take 1 tablet (3 mg total) by mouth at bedtime. Take immediately before bedtime 30 tablet 1  . hydrocortisone (ANUSOL-HC) 25 MG suppository Place 1 suppository (25 mg total) rectally every 12 (twelve) hours. 12 suppository 1  . hydrOXYzine (ATARAX/VISTARIL) 10 MG tablet Take 1 tablet (10 mg total) by mouth 3 (three) times daily as  needed for anxiety. 30 tablet 2  . ketorolac (TORADOL) 10 MG tablet Take 1 tablet (10 mg  total) by mouth every 6 (six) hours as needed for moderate pain. 12 tablet 0  . Lidocaine 0.5 % GEL Apply 5 mLs topically 2 (two) times daily. Apply when inserting suppositories. 170 g 1  . metFORMIN (GLUCOPHAGE XR) 500 MG 24 hr tablet Take 1 tablet (500 mg total) by mouth daily with breakfast. 90 tablet 1   No current facility-administered medications on file prior to visit.     Objective:     Vitals:   06/15/18 0852  BP: 106/71  Pulse: (!) 103              Physical examination   Pelvic:   Vulva: Normal appearance.  No lesions.  Vagina: No lesions or abnormalities noted.  Support: Normal pelvic support.  Urethra No masses tenderness or scarring.  Meatus Normal size without lesions or prolapse.     Anus: Normal exam.  No lesions.  Perineum: Normal exam.  No lesions.    Assessment:    G0P0000 Patient Active Problem List   Diagnosis Date Noted  . Anxiety 12/30/2017  . Contraception 12/30/2017  . PCOS (polycystic ovarian syndrome) 12/30/2017  . Insomnia 12/30/2017  . Encounter for initial prescription of contraceptive pills 09/18/2016  . Rash 09/18/2016  . History of UTI 06/17/2016  . Heel pain 01/15/2016     1. Vulvar abscess     Appears to be completely resolved with no scarring or evidence of infection.   Plan:            1.  Discussed vulvar hygiene and use of occasional antibacterial liquid soap.  2.  Patient will see her family physician for recurrent diarrhea/constipation-consider GI referral. Orders No orders of the defined types were placed in this encounter.   No orders of the defined types were placed in this encounter.     F/U  Return for Annual Physical. I spent 18 minutes involved in the care of this patient of which greater than 50% was spent discussing ED visit with diagnosis of abscess, history leading up to abscess and current condition, family history of Crohn's disease, constipation diarrhea as described-future diagnosis and  management.  Elonda Husky, M.D. 06/15/2018 9:25 AM

## 2018-06-25 ENCOUNTER — Ambulatory Visit: Payer: Managed Care, Other (non HMO) | Admitting: Family Medicine

## 2018-06-29 ENCOUNTER — Other Ambulatory Visit: Payer: Self-pay | Admitting: Family Medicine

## 2018-06-29 DIAGNOSIS — G4700 Insomnia, unspecified: Secondary | ICD-10-CM

## 2018-06-29 NOTE — Telephone Encounter (Signed)
Last OV 04/19/2018  Last refilled 04/06/2018 disp 30 with 1 refill   Sent for approval

## 2018-07-02 ENCOUNTER — Encounter: Payer: Self-pay | Admitting: Family Medicine

## 2018-07-02 ENCOUNTER — Ambulatory Visit (INDEPENDENT_AMBULATORY_CARE_PROVIDER_SITE_OTHER): Payer: Managed Care, Other (non HMO) | Admitting: Family Medicine

## 2018-07-02 VITALS — BP 100/50 | HR 74 | Temp 98.4°F | Ht 67.0 in | Wt 144.6 lb

## 2018-07-02 DIAGNOSIS — F419 Anxiety disorder, unspecified: Secondary | ICD-10-CM

## 2018-07-02 DIAGNOSIS — G4700 Insomnia, unspecified: Secondary | ICD-10-CM

## 2018-07-02 DIAGNOSIS — L409 Psoriasis, unspecified: Secondary | ICD-10-CM | POA: Diagnosis not present

## 2018-07-02 MED ORDER — BUPROPION HCL ER (XL) 300 MG PO TB24: ORAL_TABLET | ORAL | 1 refills | Status: DC

## 2018-07-02 MED ORDER — COAL TAR EXTRACT 2.5 % EX SHAM: MEDICATED_SHAMPOO | CUTANEOUS | 2 refills | Status: DC

## 2018-07-02 MED ORDER — ESZOPICLONE 3 MG PO TABS: ORAL_TABLET | ORAL | 2 refills | Status: DC

## 2018-07-02 NOTE — Progress Notes (Signed)
Subjective:    Patient ID: [name], female    DOB: 03/26/1991, 28 y.o.   MRN: [id number]  HPI   Patient presents to clinic to follow-up on anxiety while taking just Wellbutrin.  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  Does have increased stress at work, so wonders if this is playing into it.  Does feel that the Wellbutrin has been the most effective depression/anxiety take medication she has been on in the past few years.  Denies any SI or HI.  Uses Lunesta for insomnia as needed with good effect.  Does not take 1 every night, but when she does use Lunesta it works well to help her get to sleep and stay asleep.  Patient also complains of rash on scalp, dryness.  Reports she has had psoriasis patches in scalp before, but has not had a flareup in many years.  Patient Active Problem List   Diagnosis Date Noted  . Anxiety 12/30/2017  . Contraception 12/30/2017  . PCOS (polycystic ovarian syndrome) 12/30/2017  . Insomnia 12/30/2017  . Encounter for initial prescription of contraceptive pills 09/18/2016  . Rash 09/18/2016  . History of UTI 06/17/2016  . Heel pain 01/15/2016   Social History   Tobacco Use  . Smoking status: Never Smoker  . Smokeless tobacco: Never Used  Substance Use Topics  . Alcohol use: Not Currently   Review of Systems  Constitutional: Negative for chills, fatigue and fever.  HENT: Negative for congestion, ear pain, sinus pain and sore throat.   Eyes: Negative.   Respiratory: Negative for cough, shortness of breath and wheezing.   Cardiovascular: Negative for chest pain, palpitations and leg swelling.  Gastrointestinal: Negative for abdominal pain, diarrhea, nausea and vomiting.  Genitourinary: Negative for dysuria, frequency and urgency.  Musculoskeletal: Negative for arthralgias and myalgias.  Skin: Negative for color change, pallor. Rash on scalp Neurological:  Negative for syncope, light-headedness and headaches.  Psychiatric/Behavioral: increased anxiety.       Objective:   Physical Exam Vitals signs and nursing note reviewed.  Constitutional:      Appearance: She is well-developed.  HENT:     Head: Normocephalic and atraumatic.     Right Ear: Tympanic membrane, ear canal and external ear normal.     Left Ear: Tympanic membrane, ear canal and external ear normal.     Nose: Nose normal.     Mouth/Throat:     Mouth: Mucous membranes are moist.  Eyes:     General: No scleral icterus.       Right eye: No discharge.        Left eye: No discharge.     Extraocular Movements: Extraocular movements intact.     Conjunctiva/sclera: Conjunctivae normal.     Pupils: Pupils are equal, round, and reactive to light.  Neck:     Musculoskeletal: Normal range of motion and neck supple.     Trachea: No tracheal deviation.  Cardiovascular:     Rate and Rhythm: Normal rate and regular rhythm.     Heart sounds: Normal heart sounds. No murmur. No friction rub. No gallop.   Pulmonary:     Effort: Pulmonary effort is normal. No respiratory distress.     Breath sounds: Normal breath sounds. No wheezing, rhonchi or rales.  Lymphadenopathy:     Cervical: No cervical adenopathy.  Skin:    General: Skin is warm and dry.  Capillary Refill: Capillary refill takes less than 2 seconds.     Coloration: Skin is not pale.     Findings: Rash (faint redness on scalp with a few scattered dry patches. ) present. No erythema.  Neurological:     Mental Status: She is alert and oriented to person, place, and time.     Gait: Gait normal.  Psychiatric:        Mood and Affect: Mood normal.        Behavior: Behavior normal.        Thought Content: Thought content normal.        Judgment: Judgment normal.       Today's Vitals   07/02/18 0957  BP: (!) 100/50  Pulse: 74  Temp: 98.4 F (36.9 C)  TempSrc: Oral  SpO2: 98%  Weight: 144 lb 9.6 oz (65.6 kg)    Height: 5\' 7"  (1.702 m)   Body mass index is 22.65 kg/m.     Assessment & Plan:    Anxiety-discussed different options and medication adjustments.  Patient is agreeable to trialing increase in Wellbutrin dose to 300 mg/day.  Also discussed option of adding on BuSpar in the future.  Insomnia-patient having success with lunesta at this time.  Refill given.  [facility] PMP registry checked and is appropriate for refill.  Scalp psoriasis-patient will use steroid shampoo twice per week to reduce psoriasis flare.   Patient will follow-up in approximate 4 weeks for recheck on mood after increasing Wellbutrin dose.

## 2018-07-30 ENCOUNTER — Ambulatory Visit (INDEPENDENT_AMBULATORY_CARE_PROVIDER_SITE_OTHER): Payer: Managed Care, Other (non HMO) | Admitting: Family Medicine

## 2018-07-30 ENCOUNTER — Other Ambulatory Visit: Payer: Self-pay

## 2018-07-30 ENCOUNTER — Telehealth: Payer: Self-pay | Admitting: Family Medicine

## 2018-07-30 ENCOUNTER — Encounter: Payer: Self-pay | Admitting: Family Medicine

## 2018-07-30 DIAGNOSIS — G4700 Insomnia, unspecified: Secondary | ICD-10-CM | POA: Diagnosis not present

## 2018-07-30 DIAGNOSIS — F419 Anxiety disorder, unspecified: Secondary | ICD-10-CM

## 2018-07-30 NOTE — Telephone Encounter (Signed)
Please call to schedule 3 month follow up  -- July 2020  Thanks, LG

## 2018-07-30 NOTE — Telephone Encounter (Signed)
Called Pt No answer and VM is full. Will try back later.  Okay for Pec to speak to Pt Pt needs a 3 month follow up appt -July 2020 scheduled with Leanora Cover FNP

## 2018-07-30 NOTE — Progress Notes (Signed)
Patient ID: [name], female   DOB: June 26, 1990, 28 y.o.   MRN: [id number]   Virtual Visit via Video Note  I connected with [name] on 07/30/18 at 10:00 AM EDT by a video enabled telemedicine application and verified that I am speaking with the correct person using two identifiers. Location patient: home Location provider: [facility] Persons participating in the virtual visit: patient, provider  I discussed the limitations of evaluation and management by telemedicine and the availability of in person appointments. The patient expressed understanding and agreed to proceed.   HPI:  Patient and I conducted today via video chat for follow-up on anxiety and insomnia.   At last visit we discussed options for better control of her anxiety and decided to increase Wellbutrin to 300 mg daily.  Since increasing Wellbutrin, patient feels her anxiety overall has been well controlled.  Patient has had extra stress in her life related to not having to work from home due to the novel coronavirus pandemic.  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  Patient states while this transition has been stressful, overall she feels she has been handling it well and has good support from her husband.  Denies any SI or HI.  Patient uses Lunesta every night to help her get to sleep.  She states for the most part Lunesta does help her to get to sleep, but does not always help her stay asleep.  She will have times where she will wake up in the middle the night, and it will take an hour or so for her to fall back asleep, but when taking Lunesta compared to when and not taking it her sleep amount overall is improved.  Usually will get between 5 to 7 hours of sleep per night,  occasional nights here and therefore her sleep amount is 3 to 4 hours.  Patient also tries to practice good sleep hygiene by having a wind down routine, avoiding use of cell phone or TV 30 minutes before bed to help self get to sleep more easily.   Denies chest pain, denies shortness of breath, denies any GI or GU issues, denies any fever or chills or body aches.  ROS: See pertinent positives and negatives per HPI.  Patient Active Problem List   Diagnosis Date Noted  . Anxiety 12/30/2017  . Contraception 12/30/2017  . PCOS (polycystic ovarian syndrome) 12/30/2017  . Insomnia 12/30/2017  . Encounter for initial prescription of contraceptive pills 09/18/2016  . Rash 09/18/2016  . History of UTI 06/17/2016  . Heel pain 01/15/2016    Past Medical History:  Diagnosis Date  . Asthma   . PCOS (polycystic ovarian syndrome)   . UTI (urinary tract infection)     Past Surgical History:  Procedure Laterality Date  . BREAST ENHANCEMENT SURGERY  2012  . SHOULDER SURGERY    . TONSILLECTOMY     Social History   Tobacco Use  . Smoking status: Never Smoker  . Smokeless tobacco: Never Used  Substance Use Topics  . Alcohol use: Not Currently    Family History  Problem Relation Age of Onset  . Arthritis Mother   . Heart disease Mother   . Stroke Mother   . Hypertension Mother   .  Mental illness Mother   . Diabetes Mother   . Mental illness Father   . Diabetes Father   . Breast cancer Neg Hx   . Ovarian cancer Neg Hx   . Colon cancer Neg Hx     Current Outpatient Medications:  .  buPROPion (WELLBUTRIN XL) 300 MG 24 hr tablet, TAKE 1 TABLET(150 MG) BY MOUTH DAILY, Disp: 90 tablet, Rfl: 1 .  Coal Tar Extract (BETA CARE BETATAR GEL) 2.5 % SHAM, Use shampoo 2 times per week, Disp: 355 mL, Rfl: 2 .  docusate sodium (COLACE) 100 MG capsule, Take 2 capsules daily to help soften stool. If you notice stool too soft, can reduce dose to 1 capsule daily., Disp: 60 capsule, Rfl: 2 .   drospirenone-ethinyl estradiol (YAZ,GIANVI,LORYNA) 3-0.02 MG tablet, Take 1 tablet by mouth daily., Disp: 3 Package, Rfl: 3 .  Eszopiclone 3 MG TABS, TAKE 1 TABLET BY MOUTH AT BEDTIME, TAKE IMMEDIATELY BEFORE BEDTIME, Disp: 30 tablet, Rfl: 2 .  hydrocortisone (ANUSOL-HC) 25 MG suppository, Place 1 suppository (25 mg total) rectally every 12 (twelve) hours., Disp: 12 suppository, Rfl: 1 .  Lidocaine 0.5 % GEL, Apply 5 mLs topically 2 (two) times daily. Apply when inserting suppositories., Disp: 170 g, Rfl: 1  EXAM:  VITALS per patient if applicable:  GENERAL: alert, oriented, appears well and in no acute distress  HEENT: atraumatic, conjunttiva clear, no obvious abnormalities on inspection of external nose and ears  NECK: normal movements of the head and neck  LUNGS: on inspection no signs of respiratory distress, breathing rate appears normal, no obvious gross SOB, gasping or wheezing  CV: no obvious cyanosis  MS: moves all visible extremities without noticeable abnormality  PSYCH/NEURO: pleasant and cooperative, no obvious depression or anxiety, speech and thought processing grossly intact  ASSESSMENT AND PLAN:  Discussed the following assessment and plan:  Anxiety  Insomnia, unspecified type  At this time patient will continue Wellbutrin at the 300 mg dose daily.  Medication refills checked, patient was sent in a 90-day supply last month with 1 refill so she has a 47-month supply total.  Patient will continue using Lunesta to help treat insomnia as well as good sleep hygiene and a wind down routine.  Lunesta prescription was sent for a 30-day supply 1 month ago with 2 refills, so she has a 47-month supply currently, and she is aware she can call office if she needs a refill prior to her next visit.  Patient will follow up here in 3 months for recheck; aware she can return to clinic sooner/do virtual visit sooner if needed.    I discussed the assessment and treatment plan with the  patient. The patient was provided an opportunity to ask questions and all were answered. The patient agreed with the plan and demonstrated an understanding of the instructions.   Tracey Harries, FNP

## 2018-08-16 ENCOUNTER — Ambulatory Visit (INDEPENDENT_AMBULATORY_CARE_PROVIDER_SITE_OTHER): Payer: Managed Care, Other (non HMO) | Admitting: Family Medicine

## 2018-08-16 ENCOUNTER — Encounter: Payer: Self-pay | Admitting: Family Medicine

## 2018-08-16 ENCOUNTER — Telehealth: Payer: Self-pay | Admitting: Family Medicine

## 2018-08-16 ENCOUNTER — Other Ambulatory Visit: Payer: Self-pay

## 2018-08-16 DIAGNOSIS — H6693 Otitis media, unspecified, bilateral: Secondary | ICD-10-CM | POA: Diagnosis not present

## 2018-08-16 DIAGNOSIS — R5383 Other fatigue: Secondary | ICD-10-CM | POA: Diagnosis not present

## 2018-08-16 DIAGNOSIS — R590 Localized enlarged lymph nodes: Secondary | ICD-10-CM

## 2018-08-16 MED ORDER — AMOXICILLIN-POT CLAVULANATE 875-125 MG PO TABS: 1.0000 | ORAL_TABLET | Freq: Two times a day (BID) | ORAL | 0 refills | Status: DC

## 2018-08-16 NOTE — Telephone Encounter (Signed)
Done

## 2018-08-16 NOTE — Telephone Encounter (Signed)
Thanks

## 2018-08-16 NOTE — Telephone Encounter (Signed)
Can you schedule her for labs on 08/20/2018 at 330 PM?  Thanks!  LG

## 2018-08-16 NOTE — Progress Notes (Signed)
Patient ID: [name], female   DOB: 07/20/1990, 28 y.o.   MRN: [id number]  Virtual Visit via video Note  This visit type was conducted due to national recommendations for restrictions regarding the COVID-19 pandemic (e.g. social distancing).  This format is felt to be most appropriate for this patient at this time.  All issues noted in this document were discussed and addressed.  No physical exam was performed (except for noted visual exam findings with Video Visits).   I connected with Floreen Comber on 08/16/18 at  2:00 PM EDT by a video enabled telemedicine application and verified that I am speaking with the correct person using two identifiers. Location patient: home Location provider: [place] Persons participating in the virtual visit: patient, provider  I discussed the limitations, risks, security and privacy concerns of performing an evaluation and management service by telephone and the availability of in person appointments. I also discussed with the patient that there may be a patient responsible charge related to this service. The patient expressed understanding and agreed to proceed.  HPI: Patient and I connected via video chat today due to complaint of bilateral ear pain and feelings of swollen lymph nodes behind both ears for the past 2 weeks.  Patient had a UTI approximately 3 to 4 weeks ago, was treated with a course of Bactrim.  Was feeling well, but then began to develop severe ear pain and soreness in her neck which she suspected were swollen lymph nodes.  She went to an urgent care was diagnosed with bilateral ear infection as prescribed eardrops.  States she is on the last of her eardrops, should finish the bottle today or tomorrow.  Denies any fever or chills.  Denies hearing loss.  Denies any sinus pain or sinus drainage.  Denies cough, shortness of breath or wheezing.  Denies any more GU symptoms.  Denies GI issues.  Patient is concerned that she is continuing to  have ear pain and swollen lymph nodes even though she is almost done with the eardrops.   Also states she has felt fatigued, waxing and waning levels of fatigue, for months.  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  ROS: See pertinent positives and negatives per HPI.  Past Medical History:  Diagnosis Date  . Asthma   . PCOS (polycystic ovarian syndrome)   . UTI (urinary tract infection)    Past Surgical History:  Procedure Laterality Date  . BREAST ENHANCEMENT SURGERY  2012  . SHOULDER SURGERY    . TONSILLECTOMY      Family History  Problem Relation Age of Onset  . Arthritis Mother   . Heart disease Mother   . Stroke Mother   . Hypertension Mother   . Mental illness Mother   . Diabetes Mother   . Mental illness Father   . Diabetes Father   . Breast cancer Neg Hx   . Ovarian cancer Neg Hx   . Colon cancer Neg Hx    Social History   Tobacco Use  . Smoking status: Never Smoker  . Smokeless tobacco: Never Used  Substance Use Topics  . Alcohol use: Not Currently    Current Outpatient Medications:  .  buPROPion (WELLBUTRIN XL) 300 MG 24 hr tablet, TAKE 1 TABLET(150 MG) BY MOUTH DAILY, Disp: 90 tablet, Rfl: 1 .  Coal Tar Extract (BETA CARE BETATAR GEL) 2.5 % SHAM, Use shampoo 2 times per week, Disp: 355 mL, Rfl:  2 .  docusate sodium (COLACE) 100 MG capsule, Take 2 capsules daily to help soften stool. If you notice stool too soft, can reduce dose to 1 capsule daily., Disp: 60 capsule, Rfl: 2 .  drospirenone-ethinyl estradiol (YAZ,GIANVI,LORYNA) 3-0.02 MG tablet, Take 1 tablet by mouth daily., Disp: 3 Package, Rfl: 3 .  Eszopiclone 3 MG TABS, TAKE 1 TABLET BY MOUTH AT BEDTIME, TAKE IMMEDIATELY BEFORE BEDTIME, Disp: 30 tablet, Rfl: 2 .  hydrocortisone (ANUSOL-HC) 25 MG suppository, Place 1 suppository (25 mg total) rectally every 12 (twelve) hours., Disp: 12 suppository, Rfl: 1 .  Lidocaine 0.5 % GEL, Apply 5 mLs  topically 2 (two) times daily. Apply when inserting suppositories., Disp: 170 g, Rfl: 1 .  amoxicillin-clavulanate (AUGMENTIN) 875-125 MG tablet, Take 1 tablet by mouth 2 (two) times daily., Disp: 20 tablet, Rfl: 0  EXAM:  GENERAL: alert, oriented, appears well and in no acute distress  HEENT: atraumatic, conjunttiva clear, no obvious abnormalities on inspection of external nose and ears  NECK: normal movements of the head and neck  LUNGS: on inspection no signs of respiratory distress, breathing rate appears normal, no obvious gross SOB, gasping or wheezing  CV: no obvious cyanosis  MS: moves all visible extremities without noticeable abnormality  PSYCH/NEURO: pleasant and cooperative, no obvious depression or anxiety, speech and thought processing grossly intact  ASSESSMENT AND PLAN:  Discussed the following assessment and plan:  Bilateral otitis media, unspecified otitis media type - Plan: amoxicillin-clavulanate (AUGMENTIN) 875-125 MG tablet  Cervical lymphadenopathy - Plan: amoxicillin-clavulanate (AUGMENTIN) 875-125 MG tablet, CBC w/Diff, Comp Met (CMET), TSH, B12 and Folate Panel, Vitamin D (25 hydroxy)  Fatigue, unspecified type - Plan: CBC w/Diff, Comp Met (CMET), TSH, B12 and Folate Panel, Vitamin D (25 hydroxy)    Due to patient's ear pain and suspected swollen lymph nodes per her description, I wonder if the eardrops were not quite strong enough to knock out the ear infection.  She will take Augmentin twice daily for 10 days.  Advised to use Tylenol or Advil as needed to help with any pain and reduce inflammation.  Advised also to take a daily probiotic while on Augmentin course, this antibiotic can be tough on the stomach.  We will have patient come in for blood work, she will be scheduled this Friday, to further investigate her symptoms.   I discussed the assessment and treatment plan with the patient. The patient was provided an opportunity to ask questions and all  were answered. The patient agreed with the plan and demonstrated an understanding of the instructions.   The patient was advised to call back or seek an in-person evaluation if the symptoms worsen or if the condition fails to improve as anticipated.   [name], FNP

## 2018-08-19 ENCOUNTER — Encounter: Payer: Self-pay | Admitting: Family Medicine

## 2018-08-19 DIAGNOSIS — B373 Candidiasis of vulva and vagina: Secondary | ICD-10-CM

## 2018-08-19 DIAGNOSIS — B3731 Acute candidiasis of vulva and vagina: Secondary | ICD-10-CM

## 2018-08-19 MED ORDER — FLUCONAZOLE 150 MG PO TABS: ORAL_TABLET | ORAL | 0 refills | Status: DC

## 2018-08-19 NOTE — Telephone Encounter (Signed)
Patient had virtual visit DX otitis Media , on 08/16/18 now has vaginal itching discharge.

## 2018-08-20 ENCOUNTER — Other Ambulatory Visit (INDEPENDENT_AMBULATORY_CARE_PROVIDER_SITE_OTHER): Payer: Managed Care, Other (non HMO)

## 2018-08-20 ENCOUNTER — Other Ambulatory Visit: Payer: Self-pay

## 2018-08-20 DIAGNOSIS — R590 Localized enlarged lymph nodes: Secondary | ICD-10-CM

## 2018-08-20 DIAGNOSIS — E559 Vitamin D deficiency, unspecified: Secondary | ICD-10-CM

## 2018-08-20 DIAGNOSIS — R5383 Other fatigue: Secondary | ICD-10-CM

## 2018-08-20 NOTE — Addendum Note (Signed)
Addended by: Warden Fillers on: 08/20/2018 03:27 PM   Modules accepted: Orders

## 2018-08-21 LAB — CBC WITH DIFFERENTIAL/PLATELET
Absolute Monocytes: 608 cells/uL (ref 200–950)
Basophils Absolute: 40 cells/uL (ref 0–200)
Basophils Relative: 0.5 %
Eosinophils Absolute: 32 cells/uL (ref 15–500)
Eosinophils Relative: 0.4 %
HCT: 38.1 % (ref 35.0–45.0)
Hemoglobin: 13.2 g/dL (ref 11.7–15.5)
Lymphs Abs: 2757 cells/uL (ref 850–3900)
MCH: 33 pg (ref 27.0–33.0)
MCHC: 34.6 g/dL (ref 32.0–36.0)
MCV: 95.3 fL (ref 80.0–100.0)
MPV: 10.3 fL (ref 7.5–12.5)
Monocytes Relative: 7.7 %
Neutro Abs: 4464 cells/uL (ref 1500–7800)
Neutrophils Relative %: 56.5 %
Platelets: 355 10*3/uL (ref 140–400)
RBC: 4 10*6/uL (ref 3.80–5.10)
RDW: 11.9 % (ref 11.0–15.0)
Total Lymphocyte: 34.9 %
WBC: 7.9 10*3/uL (ref 3.8–10.8)

## 2018-08-21 LAB — COMPREHENSIVE METABOLIC PANEL
AG Ratio: 1.7 (calc) (ref 1.0–2.5)
ALT: 18 U/L (ref 6–29)
AST: 13 U/L (ref 10–30)
Albumin: 4 g/dL (ref 3.6–5.1)
Alkaline phosphatase (APISO): 42 U/L (ref 31–125)
BUN: 7 mg/dL (ref 7–25)
CO2: 24 mmol/L (ref 20–32)
Calcium: 9 mg/dL (ref 8.6–10.2)
Chloride: 105 mmol/L (ref 98–110)
Creat: 0.72 mg/dL (ref 0.50–1.10)
Globulin: 2.4 g/dL (calc) (ref 1.9–3.7)
Glucose, Bld: 71 mg/dL (ref 65–99)
Potassium: 3.7 mmol/L (ref 3.5–5.3)
Sodium: 137 mmol/L (ref 135–146)
Total Bilirubin: 0.5 mg/dL (ref 0.2–1.2)
Total Protein: 6.4 g/dL (ref 6.1–8.1)

## 2018-08-21 LAB — B12 AND FOLATE PANEL
Folate: 10.7 ng/mL
Vitamin B-12: 213 pg/mL (ref 200–1100)

## 2018-08-21 LAB — TSH: TSH: 1.22 mIU/L

## 2018-08-21 LAB — VITAMIN D 25 HYDROXY (VIT D DEFICIENCY, FRACTURES): Vit D, 25-Hydroxy: 21 ng/mL — ABNORMAL LOW (ref 30–100)

## 2018-08-24 MED ORDER — VITAMIN D (ERGOCALCIFEROL) 1.25 MG (50000 UNIT) PO CAPS: 50000.0000 [IU] | ORAL_CAPSULE | ORAL | 0 refills | Status: DC

## 2018-08-24 NOTE — Addendum Note (Signed)
Addended by: Leanora Cover on: 08/24/2018 03:27 PM   Modules accepted: Orders

## 2018-08-24 NOTE — Progress Notes (Signed)
Vit d rx sent

## 2018-09-23 ENCOUNTER — Ambulatory Visit (INDEPENDENT_AMBULATORY_CARE_PROVIDER_SITE_OTHER): Payer: Managed Care, Other (non HMO) | Admitting: Family Medicine

## 2018-09-23 ENCOUNTER — Other Ambulatory Visit: Payer: Self-pay

## 2018-09-23 DIAGNOSIS — N390 Urinary tract infection, site not specified: Secondary | ICD-10-CM | POA: Diagnosis not present

## 2018-09-23 DIAGNOSIS — R102 Pelvic and perineal pain: Secondary | ICD-10-CM

## 2018-09-23 MED ORDER — SULFAMETHOXAZOLE-TRIMETHOPRIM 800-160 MG PO TABS: 1.0000 | ORAL_TABLET | Freq: Two times a day (BID) | ORAL | 0 refills | Status: AC

## 2018-09-23 NOTE — Progress Notes (Signed)
Patient ID: Melanie BarnsAmber C Ruhlman, female   DOB: 1991-03-07, 28 y.o.   MRN: [id number]    Virtual Visit via video Note  This visit type was conducted due to national recommendations for restrictions regarding the COVID-19 pandemic (e.g. social distancing).  This format is felt to be most appropriate for this patient at this time.  All issues noted in this document were discussed and addressed.  No physical exam was performed (except for noted visual exam findings with Video Visits).   I connected with Ronn MelenaAmber Villagomez today at  3:00 PM EDT by a video enabled telemedicine application and verified that I am speaking with the correct person using two identifiers. Location patient: home Location provider: [facility] Persons participating in the virtual visit: patient, provider  I discussed the limitations, risks, security and privacy concerns of performing an evaluation and management service by video and the availability of in person appointments. I also discussed with the patient that there may be a patient responsible charge related to this service. The patient expressed understanding and agreed to proceed.   HPI:  Patient and I connected via video due to possibility of a UTI.  Patient has been having burning with urination off and on for the past few weeks.  States the burning and pain in pelvic area usually seems worse after sexual intercourse.  Patient has dealt with pelvic pain off and on for many years.  She is seeing GYN and per patient she was told everything looked normal.  Patient denies seeing any blood in urine.  Denies foul smell to urine.  No fever or chills.  No nausea, vomiting or diarrhea.  No shortness of breath or wheezing.  No cough.   ROS: See pertinent positives and negatives per HPI.  Past Medical History:  Diagnosis Date  . Asthma   . PCOS (polycystic ovarian syndrome)   . UTI (urinary tract infection)     Past Surgical History:  Procedure Laterality Date  . BREAST  ENHANCEMENT SURGERY  2012  . SHOULDER SURGERY    . TONSILLECTOMY      Family History  Problem Relation Age of Onset  . Arthritis Mother   . Heart disease Mother   . Stroke Mother   . Hypertension Mother   . Mental illness Mother   . Diabetes Mother   . Mental illness Father   . Diabetes Father   . Breast cancer Neg Hx   . Ovarian cancer Neg Hx   . Colon cancer Neg Hx     Social History   Tobacco Use  . Smoking status: Never Smoker  . Smokeless tobacco: Never Used  Substance Use Topics  . Alcohol use: Not Currently   Social History   Tobacco Use  . Smoking status: Never Smoker  . Smokeless tobacco: Never Used  Substance Use Topics  . Alcohol use: Not Currently     Current Outpatient Medications:  .  amoxicillin-clavulanate (AUGMENTIN) 875-125 MG tablet, Take 1 tablet by mouth 2 (two) times daily., Disp: 20 tablet, Rfl: 0 .  buPROPion (WELLBUTRIN XL) 300 MG 24 hr tablet, TAKE 1 TABLET(150 MG) BY MOUTH DAILY, Disp: 90 tablet, Rfl: 1 .  Coal Tar Extract (BETA CARE BETATAR GEL) 2.5 % SHAM, Use shampoo 2 times per week, Disp: 355 mL, Rfl: 2 .  docusate sodium (COLACE) 100 MG capsule, Take 2 capsules daily to help soften stool. If you notice stool too soft, can reduce dose to 1 capsule daily., Disp: 60 capsule, Rfl: 2 .  drospirenone-ethinyl estradiol (YAZ,GIANVI,LORYNA) 3-0.02 MG tablet, Take 1 tablet by mouth daily., Disp: 3 Package, Rfl: 3 .  Eszopiclone 3 MG TABS, TAKE 1 TABLET BY MOUTH AT BEDTIME, TAKE IMMEDIATELY BEFORE BEDTIME, Disp: 30 tablet, Rfl: 2 .  fluconazole (DIFLUCAN) 150 MG tablet, Take 1st tablet on day 1 and take 2nd tablet on day 4, Disp: 2 tablet, Rfl: 0 .  hydrocortisone (ANUSOL-HC) 25 MG suppository, Place 1 suppository (25 mg total) rectally every 12 (twelve) hours., Disp: 12 suppository, Rfl: 1 .  Lidocaine 0.5 % GEL, Apply 5 mLs topically 2 (two) times daily. Apply when inserting suppositories., Disp: 170 g, Rfl: 1 .  Vitamin D, Ergocalciferol,  (DRISDOL) 1.25 MG (50000 UT) CAPS capsule, Take 1 capsule (50,000 Units total) by mouth every 7 (seven) days., Disp: 12 capsule, Rfl: 0  EXAM:  GENERAL: alert, oriented, appears well and in no acute distress  HEENT: atraumatic, conjunttiva clear, no obvious abnormalities on inspection of external nose and ears  NECK: normal movements of the head and neck  LUNGS: on inspection no signs of respiratory distress, breathing rate appears normal, no obvious gross SOB, gasping or wheezing  CV: no obvious cyanosis  MS: moves all visible extremities without noticeable abnormality  PSYCH/NEURO: pleasant and cooperative, no obvious depression or anxiety, speech and thought processing grossly intact  ASSESSMENT AND PLAN:  Discussed the following assessment and plan:  Pelvic pain in female - Plan: Ambulatory referral to Urogynecology  Urinary tract infection without hematuria, site unspecified - Plan: Ambulatory referral to Urogynecology, sulfamethoxazole-trimethoprim (BACTRIM DS) 800-160 MG tablet  Patient potentially could have a UTI.  We will treat as such with Bactrim twice daily for 5 days.  We will also put in referral for urogynecology due to pelvic pain symptoms and urinary symptoms.  I am hopeful uro-gynecologist can help [place] get to reasoning behind patient's pain.  Patient advised that if she does not hear from somebody in the next 7 days about referral to call and let us know.   I discussed the assessment and treatment plan with the patient. The patient was provided an opportunity to ask questions and all were answered. The patient agreed with the plan and demonstrated an understanding of the instructions.   The patient was advised to call back or seek an in-person evaluation if the symptoms worsen or if the condition fails to improve as anticipated.  Patient will otherwise keep regularly scheduled follow-up as planned  Tracey Harries, FNP

## 2018-09-24 ENCOUNTER — Encounter: Payer: Self-pay | Admitting: Family Medicine

## 2018-10-25 ENCOUNTER — Encounter: Payer: Self-pay | Admitting: Family Medicine

## 2018-10-26 NOTE — Telephone Encounter (Signed)
Called Pt and told her that the office she was referred to was [facility].

## 2018-10-26 NOTE — Telephone Encounter (Signed)
Not sure where the referral went --- can you find out and let patient know?

## 2018-11-29 ENCOUNTER — Other Ambulatory Visit: Payer: Self-pay

## 2018-11-29 ENCOUNTER — Telehealth (INDEPENDENT_AMBULATORY_CARE_PROVIDER_SITE_OTHER): Payer: Managed Care, Other (non HMO) | Admitting: Family Medicine

## 2018-11-29 DIAGNOSIS — M79601 Pain in right arm: Secondary | ICD-10-CM | POA: Diagnosis not present

## 2018-11-29 DIAGNOSIS — G5601 Carpal tunnel syndrome, right upper limb: Secondary | ICD-10-CM

## 2018-11-29 MED ORDER — PREDNISONE 10 MG (21) PO TBPK: ORAL_TABLET | ORAL | 0 refills | Status: DC

## 2018-11-29 NOTE — Progress Notes (Signed)
Patient ID: [name], female   DOB: 1991/03/25, 28 y.o.   MRN: [id number]    Virtual Visit via video Note  This visit type was conducted due to national recommendations for restrictions regarding the COVID-19 pandemic (e.g. social distancing).  This format is felt to be most appropriate for this patient at this time.  All issues noted in this document were discussed and addressed.  No physical exam was performed (except for noted visual exam findings with Video Visits).   I connected with [name] today at  1:00 PM EDT by a video enabled telemedicine application and verified that I am speaking with the correct person using two identifiers. Location patient: home Location provider: work or home office Persons participating in the virtual visit: patient, provider  I discussed the limitations, risks, security and privacy concerns of performing an evaluation and management service by telephone and the availability of in person appointments. I also discussed with the patient that there may be a patient responsible charge related to this service. The patient expressed understanding and agreed to proceed.   HPI:  Patient and I connected via video due to complaints of right arm pain.  States pain usually begins in right pinky and ring finger and then will travel up along the right side of forearm up into her right shoulder.  Patient has had something similar occur before, but was told she had bruised or inflamed the tendon and was prescribed anti-inflammatories.  Patient also has an old shoulder injury and will have pain in the shoulder from time to time.  Denies any new injury to right hand, wrist, arm or shoulder.  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  Notices sometimes the right pinky and right ring finger will feel tingly and also has occasions where holding a coffee mug in right hand is tough.  No fever or chills. No redness of skin.   States both  her parents have had carpal tunnel surgeries.   ROS: See pertinent positives and negatives per HPI.  Past Medical History:  Diagnosis Date  . Asthma   . PCOS (polycystic ovarian syndrome)   . UTI (urinary tract infection)     Past Surgical History:  Procedure Laterality Date  . BREAST ENHANCEMENT SURGERY  2012  . SHOULDER SURGERY    . TONSILLECTOMY      Family History  Problem Relation Age of Onset  . Arthritis Mother   . Heart disease Mother   . Stroke Mother   . Hypertension Mother   . Mental illness Mother   . Diabetes Mother   . Mental illness Father   . Diabetes Father   . Breast cancer Neg Hx   . Ovarian cancer Neg Hx   . Colon cancer Neg Hx    Social History   Tobacco Use  . Smoking status: Never Smoker  . Smokeless tobacco: Never Used  Substance Use Topics  . Alcohol use: Not Currently    Current Outpatient Medications:  .  buPROPion (WELLBUTRIN XL) 300 MG 24 hr tablet, TAKE 1 TABLET(150 MG) BY MOUTH DAILY, Disp: 90 tablet, Rfl: 1 .  Coal Tar Extract (BETA CARE BETATAR GEL) 2.5 % SHAM, Use shampoo 2 times per week, Disp: 355 mL, Rfl: 2 .  docusate sodium (COLACE) 100 MG capsule, Take 2 capsules daily to help soften stool. If you notice stool too soft, can reduce dose to 1 capsule daily., Disp: 60 capsule, Rfl:  2 .  drospirenone-ethinyl estradiol (YAZ,GIANVI,LORYNA) 3-0.02 MG tablet, Take 1 tablet by mouth daily., Disp: 3 Package, Rfl: 3 .  Eszopiclone 3 MG TABS, TAKE 1 TABLET BY MOUTH AT BEDTIME, TAKE IMMEDIATELY BEFORE BEDTIME, Disp: 30 tablet, Rfl: 2 .  fluconazole (DIFLUCAN) 150 MG tablet, Take 1st tablet on day 1 and take 2nd tablet on day 4, Disp: 2 tablet, Rfl: 0 .  hydrocortisone (ANUSOL-HC) 25 MG suppository, Place 1 suppository (25 mg total) rectally every 12 (twelve) hours., Disp: 12 suppository, Rfl: 1 .  Lidocaine 0.5 % GEL, Apply 5 mLs topically 2 (two) times daily. Apply when inserting suppositories., Disp: 170 g, Rfl: 1 .  Vitamin D,  Ergocalciferol, (DRISDOL) 1.25 MG (50000 UT) CAPS capsule, Take 1 capsule (50,000 Units total) by mouth every 7 (seven) days., Disp: 12 capsule, Rfl: 0  EXAM:  GENERAL: alert, oriented, appears well and in no acute distress  HEENT: atraumatic, conjunttiva clear, no obvious abnormalities on inspection of external nose and ears  NECK: normal movements of the head and neck  LUNGS: on inspection no signs of respiratory distress, breathing rate appears normal, no obvious gross SOB, gasping or wheezing  CV: no obvious cyanosis  MS: moves all visible extremities without noticeable abnormality.  Over video feed patient is able to raise right arm straight up to move without issues.  Right forearm without problems.  Able to make tight fist and flex all fingers of right hand without obvious issues.  Asked patient to feel radial and ulnar pulses, states she is able to easily find his pulses.  PSYCH/NEURO: pleasant and cooperative, no obvious depression or anxiety, speech and thought processing grossly intact  ASSESSMENT AND PLAN:  Discussed the following assessment and plan:  Right arm pain/suspect carpal tunnel right wrist - due to pain beginning in the right ring and pinky finger and traveling up the arm I do have suspicions for carpal tunnel especially due to patient being right-handed and also doing a lot of typing for her work.  She will do oral steroid taper to help, she will also use Tylenol stretching and range of motion exercises with arm and wrist stress ball to help strengthen hand.  She will use wrist brace overnight and throughout the day as needed for joint support.  We will also do referral to neurology for evaluation and possible nerve conduction study.   I discussed the assessment and treatment plan with the patient. The patient was provided an opportunity to ask questions and all were answered. The patient agreed with the plan and demonstrated an understanding of the instructions.    The patient was advised to call back or seek an in-person evaluation if the symptoms worsen or if the condition fails to improve as anticipated.    Melanie Green, FNP

## 2018-11-30 ENCOUNTER — Encounter: Payer: Self-pay | Admitting: Neurology

## 2018-12-01 ENCOUNTER — Other Ambulatory Visit: Payer: Self-pay

## 2018-12-01 DIAGNOSIS — G5601 Carpal tunnel syndrome, right upper limb: Secondary | ICD-10-CM

## 2018-12-01 DIAGNOSIS — M79601 Pain in right arm: Secondary | ICD-10-CM

## 2018-12-24 ENCOUNTER — Other Ambulatory Visit: Payer: Self-pay | Admitting: Family Medicine

## 2018-12-24 DIAGNOSIS — F419 Anxiety disorder, unspecified: Secondary | ICD-10-CM

## 2018-12-24 DIAGNOSIS — G4700 Insomnia, unspecified: Secondary | ICD-10-CM

## 2018-12-27 ENCOUNTER — Other Ambulatory Visit: Payer: Self-pay | Admitting: Lab

## 2018-12-27 DIAGNOSIS — F419 Anxiety disorder, unspecified: Secondary | ICD-10-CM

## 2018-12-27 DIAGNOSIS — G4700 Insomnia, unspecified: Secondary | ICD-10-CM

## 2018-12-27 MED ORDER — BUPROPION HCL ER (XL) 300 MG PO TB24: ORAL_TABLET | ORAL | 1 refills | Status: DC

## 2018-12-27 MED ORDER — ESZOPICLONE 3 MG PO TABS: ORAL_TABLET | ORAL | 2 refills | Status: DC

## 2019-01-04 ENCOUNTER — Ambulatory Visit (INDEPENDENT_AMBULATORY_CARE_PROVIDER_SITE_OTHER): Payer: Managed Care, Other (non HMO) | Admitting: Neurology

## 2019-01-04 ENCOUNTER — Other Ambulatory Visit: Payer: Self-pay

## 2019-01-04 DIAGNOSIS — M79601 Pain in right arm: Secondary | ICD-10-CM

## 2019-01-04 DIAGNOSIS — G5601 Carpal tunnel syndrome, right upper limb: Secondary | ICD-10-CM

## 2019-01-04 NOTE — Procedures (Signed)
[facility]  [place], [place]  [place], [place] [address] Tel: [phone] Fax:  [phone] Test Date:  01/04/2019  Patient: [name] DOB: 24-Nov-1990 Physician: Narda Verlin, DO  Sex: Female Height: 5\' 7"  Ref Phys: Philis Nettle, FNP  ID#: [id number] Temp: 32.6C Technician:    Patient Complaints: This is a 28 year old female referred for evaluation of right arm pain worse over the wrist and fourth and fifth digits.  NCV & EMG Findings: Extensive electrodiagnostic testing of the right upper extremity shows:  1. Right median, ulnar, and mixed palmar sensory responses are within normal limits. 2. Right median and ulnar motor responses are within normal limits. 3. There is no evidence of active or chronic motor axonal loss changes affecting any of the tested muscles.  Motor unit configuration and recruitment pattern is within normal limits.  Impression: This is a normal study of the right upper extremity.  In particular, there is no evidence of carpal tunnel syndrome, ulnar neuropathy, or cervical radiculopathy.   ___________________________ Narda Tami, DO    Nerve Conduction Studies Anti Sensory Summary Table   Site NR Peak (ms) Norm Peak (ms) P-T Amp (V) Norm P-T Amp  Right Median Anti Sensory (2nd Digit)  32.6C  Wrist    2.9 <3.4 72.9 >20  Right Ulnar Anti Sensory (5th Digit)  32.6C  Wrist    2.5 <3.1 60.8 >12   Motor Summary Table   Site NR Onset (ms) Norm Onset (ms) O-P Amp (mV) Norm O-P Amp Site1 Site2 Delta-0 (ms) Dist (cm) Vel (m/s) Norm Vel (m/s)  Right Median Motor (Abd Poll Brev)  32.6C  Wrist    3.0 <3.9 8.8 >6 Elbow Wrist 5.2 27.0 52 >50  Elbow    8.2  8.1         Right Ulnar Motor (Abd Dig Minimi)  32.6C  Wrist    2.0 <3.1 11.8 >7 B Elbow Wrist 3.4 22.0 65 >50  B Elbow    5.4  11.7  A Elbow B Elbow 1.6 10.0 63 >50  A Elbow    7.0  11.2          Comparison Summary Table   Site NR Peak (ms) Norm Peak (ms) P-T Amp (V) Site1  Site2 Delta-P (ms) Norm Delta (ms)  Right Median/Ulnar Palm Comparison (Wrist - 8cm)  32.6C  Median Palm    1.8 <2.2 59.5 Median Palm Ulnar Palm 0.2   Ulnar Palm    1.6 <2.2 36.6       EMG   Side Muscle Ins Act Fibs Psw Fasc Number Recrt Dur Dur. Amp Amp. Poly Poly. Comment  Right 1stDorInt Nml Nml Nml Nml Nml Nml Nml Nml Nml Nml Nml Nml N/A  Right PronatorTeres Nml Nml Nml Nml Nml Nml Nml Nml Nml Nml Nml Nml N/A  Right Biceps Nml Nml Nml Nml Nml Nml Nml Nml Nml Nml Nml Nml N/A  Right Triceps Nml Nml Nml Nml Nml Nml Nml Nml Nml Nml Nml Nml N/A  Right Deltoid Nml Nml Nml Nml Nml Nml Nml Nml Nml Nml Nml Nml N/A      Waveforms:

## 2019-01-20 ENCOUNTER — Other Ambulatory Visit: Payer: Self-pay | Admitting: Family Medicine

## 2019-02-21 ENCOUNTER — Encounter: Payer: Self-pay | Admitting: Family Medicine

## 2019-02-21 ENCOUNTER — Ambulatory Visit (INDEPENDENT_AMBULATORY_CARE_PROVIDER_SITE_OTHER): Payer: Managed Care, Other (non HMO) | Admitting: Family Medicine

## 2019-02-21 ENCOUNTER — Other Ambulatory Visit: Payer: Self-pay

## 2019-02-21 VITALS — Temp 100.1°F | Ht 67.0 in | Wt 126.0 lb

## 2019-02-21 DIAGNOSIS — R110 Nausea: Secondary | ICD-10-CM | POA: Diagnosis not present

## 2019-02-21 DIAGNOSIS — Z20822 Contact with and (suspected) exposure to covid-19: Secondary | ICD-10-CM

## 2019-02-21 DIAGNOSIS — Z20828 Contact with and (suspected) exposure to other viral communicable diseases: Secondary | ICD-10-CM

## 2019-02-21 MED ORDER — ONDANSETRON 4 MG PO TBDP: 4.0000 mg | ORAL_TABLET | Freq: Three times a day (TID) | ORAL | 1 refills | Status: DC | PRN

## 2019-02-21 NOTE — Progress Notes (Signed)
Patient ID: [name], female   DOB: 1990-07-01, 28 y.o.   MRN: [id number]    Virtual Visit via video Note  This visit type was conducted due to national recommendations for restrictions regarding the COVID-19 pandemic (e.g. social distancing).  This format is felt to be most appropriate for this patient at this time.  All issues noted in this document were discussed and addressed.  No physical exam was performed (except for noted visual exam findings with Video Visits).   I connected with Ronn Melena today at  1:20 PM EDT by a video enabled telemedicine application and verified that I am speaking with the correct person using two identifiers. Location patient: home Location provider: work or home office Persons participating in the virtual visit: patient, provider  I discussed the limitations, risks, security and privacy concerns of performing an evaluation and management service by video and the availability of in person appointments. I also discussed with the patient that there may be a patient responsible charge related to this service. The patient expressed understanding and agreed to proceed.  HPI:  Patient and I connected via video due to complaints of nausea and general feeling of fatigue and unwell.  She does have to travel a lot for her job, so it is possible she is exposed to someone who has had COVID-19.  Patient did get tested this a.m. prior to our appointment.  Denies fever or chills.  Denies body aches.  Cough, shortness breath or wheezing.  Denies chest pain.  She is urinating normally.  Trying to get herself to drink good amount of fluids, but not going too fast due to fear of hurting her stomach more.  Is sipping on water and Gatorade.  Did have some chicken noodle soup for lunch.  ROS: See pertinent positives and negatives per HPI.  Past Medical History:  Diagnosis Date  . Asthma   . PCOS (polycystic ovarian syndrome)   . UTI (urinary tract infection)     Past  Surgical History:  Procedure Laterality Date  . BREAST ENHANCEMENT SURGERY  2012  . SHOULDER SURGERY    . TONSILLECTOMY      Family History  Problem Relation Age of Onset  . Arthritis Mother   . Heart disease Mother   . Stroke Mother   . Hypertension Mother   . Mental illness Mother   . Diabetes Mother   . Mental illness Father   . Diabetes Father   . Breast cancer Neg Hx   . Ovarian cancer Neg Hx   . Colon cancer Neg Hx    Social History   Tobacco Use  . Smoking status: Never Smoker  . Smokeless tobacco: Never Used  Substance Use Topics  . Alcohol use: Not Currently    Current Outpatient Medications:  .  buPROPion (WELLBUTRIN XL) 300 MG 24 hr tablet, TAKE 1 TABLET(150 MG) BY MOUTH DAILY, Disp: 90 tablet, Rfl: 1 .  drospirenone-ethinyl estradiol (YAZ) 3-0.02 MG tablet, TAKE 1 TABLET BY MOUTH DAILY, Disp: 84 tablet, Rfl: 0 .  Eszopiclone 3 MG TABS, TAKE 1 TABLET BY MOUTH AT BEDTIME, TAKE IMMEDIATELY BEFORE BEDTIME, Disp: 30 tablet, Rfl: 2 .  Vitamin D, Ergocalciferol, (DRISDOL) 1.25 MG (50000 UT) CAPS capsule, Take 1 capsule (50,000 Units total) by mouth every 7 (seven) days., Disp: 12 capsule, Rfl: 0 .  Coal Tar Extract (BETA CARE BETATAR GEL) 2.5 % SHAM, Use shampoo 2 times per week, Disp: 355 mL, Rfl: 2 .  docusate sodium (COLACE)  100 MG capsule, Take 2 capsules daily to help soften stool. If you notice stool too soft, can reduce dose to 1 capsule daily., Disp: 60 capsule, Rfl: 2 .  fluconazole (DIFLUCAN) 150 MG tablet, Take 1st tablet on day 1 and take 2nd tablet on day 4, Disp: 2 tablet, Rfl: 0 .  hydrocortisone (ANUSOL-HC) 25 MG suppository, Place 1 suppository (25 mg total) rectally every 12 (twelve) hours., Disp: 12 suppository, Rfl: 1 .  Lidocaine 0.5 % GEL, Apply 5 mLs topically 2 (two) times daily. Apply when inserting suppositories., Disp: 170 g, Rfl: 1 .  predniSONE (STERAPRED UNI-PAK 21 TAB) 10 MG (21) TBPK tablet, Take according to pack instructions, Disp: 21  tablet, Rfl: 0  EXAM:  GENERAL: alert, oriented, appears well and in no acute distress  HEENT: atraumatic, conjunttiva clear, no obvious abnormalities on inspection of external nose and ears  NECK: normal movements of the head and neck  LUNGS: on inspection no signs of respiratory distress, breathing rate appears normal, no obvious gross SOB, gasping or wheezing  CV: no obvious cyanosis  MS: moves all visible extremities without noticeable abnormality  PSYCH/NEURO: pleasant and cooperative, no obvious depression or anxiety, speech and thought processing grossly intact  ASSESSMENT AND PLAN:  Discussed the following assessment and plan:  Suspected COVID-19 virus infection, nausea - advised that due to symptoms, we need to get patient set up for COVID-19 testing. Patient was tested this AM prior to our video call. Patient advised that testing is taking 2 to 7 days to result, and while we are awaiting results patient must remain under self quarantine and monitor for any changing/worsening symptoms.  She will use Zofran as needed for nausea. Advised over-the-counter medications such as Tylenol can be used to help treat pain or fevers, Robitussin can be used to help calm cough, allergy medication such as Claritin or Allegra can help reduce congestion.  Also discussed getting plenty of rest and increasing fluid intake.  Made patient aware that test results as well as how his symptoms progress will determine when the self quarantine will be able to end.  Also advised to monitor self for any worsening symptoms, advised if severe shortness of breath develops, high fever that is not reduced with use of Tylenol, chest pain, severe vomiting or diarrhea  --patient must call on-call and or go to ER right away for evaluation. patient verbalized understanding of these instructions.   Work note released to [employer]   I discussed the assessment and treatment plan with the patient. The patient was provided an  opportunity to ask questions and all were answered. The patient agreed with the plan and demonstrated an understanding of the instructions.   The patient was advised to call back or seek an in-person evaluation if the symptoms worsen or if the condition fails to improve as anticipated.   [name], FNP

## 2019-02-22 LAB — NOVEL CORONAVIRUS, NAA: SARS-CoV-2, NAA: NOT DETECTED

## 2019-03-29 ENCOUNTER — Telehealth: Payer: Self-pay | Admitting: Family Medicine

## 2019-03-29 DIAGNOSIS — G4700 Insomnia, unspecified: Secondary | ICD-10-CM

## 2019-03-29 NOTE — Telephone Encounter (Signed)
Last refill 8/20last OV 10/20 patient requesting refill on lunesta.

## 2019-03-29 NOTE — Telephone Encounter (Signed)
Pt called in to request a Rx for Lunesta 3MG  tablets - TAKE 1 TABLET BY BEDTIME Daily     Pharmacy:  [id number] [name], [place] [phone] (Phone) [phone] (Fax

## 2019-03-30 MED ORDER — ESZOPICLONE 3 MG PO TABS: ORAL_TABLET | ORAL | 0 refills | Status: DC

## 2019-03-30 NOTE — Telephone Encounter (Signed)
° °  Pt call to fup on her refill   Lunesta 3MG  tablets

## 2019-03-30 NOTE — Telephone Encounter (Signed)
Tried to return call to patient to advise I have forwarded the refill to a supervising provider for review , voicemail is full could not leave message.

## 2019-03-30 NOTE — Telephone Encounter (Signed)
Sent to pharmacy 

## 2019-04-27 ENCOUNTER — Other Ambulatory Visit: Payer: Self-pay | Admitting: Family Medicine

## 2019-04-27 DIAGNOSIS — G4700 Insomnia, unspecified: Secondary | ICD-10-CM

## 2019-04-28 NOTE — Telephone Encounter (Signed)
Refilled: 03/30/2019 Last OV: 02/21/2019 Next OV: not scheduled

## 2019-04-28 NOTE — Telephone Encounter (Signed)
Patient needs to schedule an appointment to establish care with a new provider.  Please call her and inform her of this.  I will refill 1 additional time though she will need to have an appointment scheduled for further refills.

## 2019-05-28 ENCOUNTER — Other Ambulatory Visit: Payer: Self-pay | Admitting: Family Medicine

## 2019-05-28 DIAGNOSIS — G4700 Insomnia, unspecified: Secondary | ICD-10-CM

## 2019-05-29 NOTE — Telephone Encounter (Signed)
Patient needs to schedule an appointment with a new provider to get another refill. This was noted in the last refill request note, though it does not look like the patient was contacted. Please call her to get her set up to establish care with a new provider and then I will consider refilling. Thanks.

## 2019-05-29 NOTE — Telephone Encounter (Signed)
LOV: 02/21/19 with Leanora Cover, NP  NOV: N/A

## 2019-05-31 ENCOUNTER — Other Ambulatory Visit: Payer: Self-pay | Admitting: Family Medicine

## 2019-05-31 DIAGNOSIS — G4700 Insomnia, unspecified: Secondary | ICD-10-CM

## 2019-06-01 ENCOUNTER — Other Ambulatory Visit: Payer: Self-pay

## 2019-06-01 MED ORDER — ESZOPICLONE 3 MG PO TABS: ORAL_TABLET | ORAL | 0 refills | Status: DC

## 2019-06-01 MED ORDER — DROSPIRENONE-ETHINYL ESTRADIOL 3-0.02 MG PO TABS: 1.0000 | ORAL_TABLET | Freq: Every day | ORAL | 0 refills | Status: DC

## 2019-06-01 NOTE — Telephone Encounter (Signed)
I have filled BC for one month until patient can be seen by you. I have also already sent this request to you. Dr. Birdie Sons was filling since Leotis Shames has been gone.

## 2019-06-01 NOTE — Telephone Encounter (Signed)
I looked up patient on [place] Controlled Substances Reporting System and saw no activity that raised concern of inappropriate use.   

## 2019-06-01 NOTE — Telephone Encounter (Signed)
Pt has made TOC appt with Arnett for 06/14/19  Pt needs a refill on Eszopiclone 3 MG TABS and drospirenone-ethinyl estradiol (YAZ) 3-0.02 MG tablet Sent to [facility]

## 2019-06-08 ENCOUNTER — Other Ambulatory Visit: Payer: Self-pay

## 2019-06-08 ENCOUNTER — Encounter: Payer: Self-pay | Admitting: Urology

## 2019-06-08 ENCOUNTER — Ambulatory Visit: Payer: Managed Care, Other (non HMO) | Admitting: Urology

## 2019-06-08 VITALS — BP 162/125 | HR 74 | Ht 67.0 in | Wt 126.0 lb

## 2019-06-08 DIAGNOSIS — N9489 Other specified conditions associated with female genital organs and menstrual cycle: Secondary | ICD-10-CM

## 2019-06-08 DIAGNOSIS — N390 Urinary tract infection, site not specified: Secondary | ICD-10-CM | POA: Diagnosis not present

## 2019-06-08 LAB — URINALYSIS, COMPLETE
Bilirubin, UA: NEGATIVE
Glucose, UA: NEGATIVE
Leukocytes,UA: NEGATIVE
Nitrite, UA: NEGATIVE
RBC, UA: NEGATIVE
Specific Gravity, UA: 1.02 (ref 1.005–1.030)
Urobilinogen, Ur: 0.2 mg/dL (ref 0.2–1.0)
pH, UA: 7 (ref 5.0–7.5)

## 2019-06-08 LAB — MICROSCOPIC EXAMINATION: RBC, Urine: NONE SEEN /hpf (ref 0–2)

## 2019-06-08 LAB — BLADDER SCAN AMB NON-IMAGING: Scan Result: 10

## 2019-06-08 NOTE — Progress Notes (Signed)
06/08/2019 3:32 PM   [name] 1991/03/04 [id number]  Referring provider: Tracey Harries, FNP No address on file  Chief Complaint  Patient presents with  . Urinary Tract Infection    HPI: Pleasant 29 year old female who presents today for further evaluation of possible recurrent urinary tract infections.  Notably, extensive chart review today indicates that she was seen and evaluated at [facility] urogynecology  for this approximately 6 months ago as well as has recently been seen and evaluated by Dr. Dalbert Garnet.  She has intermittent pelvic pain, dysuria and UTI type symptoms.  Due to Covid and travel, she has been calling in for antibiotics on a frequent basis, about once per month for treatment of these presumed urinary tract infections without UA/urine culture data.  There are no available documented urinary tract infection cultures or urinalyses in the [employer], [place], or [facility] system.  PvR minimal 10/2018.  Most recently, she was seen and evaluated pelvic exam with Dr. Dalbert Garnet.  She was diagnosed with vulvodynia and vaginismus.  She is starting to see physical therapy next week for this.  She was also prescribed topical testosterone cream for vulvodynia which she recently started.  She does note some exacerbating foods and drinks.  She is cut out alcohol altogether and is cut back on red meats and other things that seem to be a trigger for her pelvic pain.  She is sexually active with some discomfort.  Normal bowel movements.  She takes cranberry tablets once daily as well as a probiotic.  She does have a remote history of a "kidney infection".  No flank pain.  No history of stones.   CT scan abd/ pelvis with contrast 12/19 negative.  Today she is asymptomatic.+  PMH: Past Medical History:  Diagnosis Date  . Asthma   . PCOS (polycystic ovarian syndrome)   . UTI (urinary tract infection)     Surgical History: Past Surgical History:  Procedure Laterality Date  . BREAST  ENHANCEMENT SURGERY  2012  . SHOULDER SURGERY    . TONSILLECTOMY      Home Medications:  Allergies as of 06/08/2019      Reactions   Aspirin Anaphylaxis   Levaquin [levofloxacin In D5w] Hives      Medication List       Accurate as of June 08, 2019  3:32 PM. If you have any questions, ask your nurse or doctor.        STOP taking these medications   Coal Tar Extract 2.5 % Sham Commonly known as: Beta Care Betatar Gel Stopped by: Vanna Scotland, MD   docusate sodium 100 MG capsule Commonly known as: Colace Stopped by: Vanna Scotland, MD   fluconazole 150 MG tablet Commonly known as: DIFLUCAN Stopped by: Vanna Scotland, MD   Lidocaine 0.5 % Gel Stopped by: Vanna Scotland, MD   ondansetron 4 MG disintegrating tablet Commonly known as: Zofran ODT Stopped by: Vanna Scotland, MD   predniSONE 10 MG (21) Tbpk tablet Commonly known as: STERAPRED UNI-PAK 21 TAB Stopped by: Vanna Scotland, MD   Vitamin D (Ergocalciferol) 1.25 MG (50000 UNIT) Caps capsule Commonly known as: DRISDOL Stopped by: Vanna Scotland, MD     TAKE these medications   buPROPion 300 MG 24 hr tablet Commonly known as: WELLBUTRIN XL TAKE 1 TABLET(150 MG) BY MOUTH DAILY   drospirenone-ethinyl estradiol 3-0.02 MG tablet Commonly known as: YAZ Take 1 tablet by mouth daily.   Eszopiclone 3 MG Tabs TAKE 1 TABLET BY MOUTH EVERY NIGHT  AT BEDTIME, TAKE IMMEDIATELY BEFORE BEDTIME       Allergies:  Allergies  Allergen Reactions  . Aspirin Anaphylaxis  . Levaquin [Levofloxacin In D5w] Hives    Family History: Family History  Problem Relation Age of Onset  . Arthritis Mother   . Heart disease Mother   . Stroke Mother   . Hypertension Mother   . Mental illness Mother   . Diabetes Mother   . Mental illness Father   . Diabetes Father   . Breast cancer Neg Hx   . Ovarian cancer Neg Hx   . Colon cancer Neg Hx     Social History:  reports that she has never smoked. She has never used  smokeless tobacco. She reports previous alcohol use. She reports that she does not use drugs.  Physical Exam: BP (!) 162/125   Pulse 74   Ht 5\' 7"  (1.702 m)   Wt 126 lb (57.2 kg)   BMI 19.73 kg/m   Constitutional:  Alert and oriented, No acute distress. HEENT: [place] AT, moist mucus membranes.  Trachea midline, no masses. Cardiovascular: No clubbing, cyanosis, or edema. Respiratory: Normal respiratory effort, no increased work of breathing. Skin: No rashes, bruises or suspicious lesions. Neurologic: Grossly intact, no focal deficits, moving all 4 extremities. Psychiatric: Normal mood and affect.  Laboratory Data: Lab Results  Component Value Date   WBC 7.9 08/20/2018   HGB 13.2 08/20/2018   HCT 38.1 08/20/2018   MCV 95.3 08/20/2018   PLT 355 08/20/2018    Lab Results  Component Value Date   CREATININE 0.72 08/20/2018    Urinalysis pending  Assessment & Plan:    1. Recurrent UTI vs. IC As per her previous evaluation and urogynecology, at this point in time we have no documentation of that her symptoms are in fact related to recurrent urinary tract infections  I have urged her to come in for same-day evaluation for UA/urine culture with our PA which can be Monday through Friday.  If this weekend, she recommend going to urgent care such as [facility] so that we can start assessing whether or not this is related to true urinary tract infections versus a pelvic pain type syndrome such as IC.    We discussed that IC is a diagnosis of exclusion but given her history of vulvodynia and vaginismus, this often is seen in this constellation of issues.  IC dietary information given today.  Previous upper tract imaging is reassuring and she is also been proven to have adequate emptying with unremarkable pelvic exams.  At this point time, would not recommend any further evaluation or treatment unless she is proven to have true infections.  Asymptomatic today  We discussed  generalized hygiene recommendations as well as advised to continue cranberry tablets twice daily and probiotics for UTI prevention.  All questions answered. - Urinalysis, Complete - Bladder Scan (Post Void Residual) in office  2. Pelvic pain syndrome Agree with rec by Waynard Edwards, MD  [facility] [address] [place], [place] [address] [phone]  I spent 45 min with this patient of which greater than 50% was spent in counseling and coordination of care with the patient.

## 2019-06-14 ENCOUNTER — Encounter: Payer: Self-pay | Admitting: Family

## 2019-06-14 ENCOUNTER — Ambulatory Visit (INDEPENDENT_AMBULATORY_CARE_PROVIDER_SITE_OTHER): Payer: Managed Care, Other (non HMO) | Admitting: Family

## 2019-06-14 DIAGNOSIS — F329 Major depressive disorder, single episode, unspecified: Secondary | ICD-10-CM | POA: Diagnosis not present

## 2019-06-14 DIAGNOSIS — Z30011 Encounter for initial prescription of contraceptive pills: Secondary | ICD-10-CM | POA: Diagnosis not present

## 2019-06-14 DIAGNOSIS — F32A Depression, unspecified: Secondary | ICD-10-CM

## 2019-06-14 DIAGNOSIS — G4700 Insomnia, unspecified: Secondary | ICD-10-CM

## 2019-06-14 DIAGNOSIS — F419 Anxiety disorder, unspecified: Secondary | ICD-10-CM

## 2019-06-14 MED ORDER — SERTRALINE HCL 50 MG PO TABS: 50.0000 mg | ORAL_TABLET | Freq: Every day | ORAL | 3 refills | Status: DC

## 2019-06-14 NOTE — Assessment & Plan Note (Addendum)
Exacerbated. I suspect that increased anxiety is complicated by undiagnosed pelvic pain,  fatigue and weight loss (? Related to GI , pending appointment or related to pelvic floor pain). Trial of zoloft. Close follow up to see if fatigue, anxiety improves on zoloft.

## 2019-06-14 NOTE — Assessment & Plan Note (Addendum)
Some worsening.Increase fatigue of late.   however discussed that she would benefit from SSRI, trial of zoloft as well. Will continue lunesta. Pending labs

## 2019-06-14 NOTE — Assessment & Plan Note (Addendum)
Advised to STOP birth control and to use barrier methods for birth control until this has been evaluated. Mother has factor v leiden. Pending anticoagulation panel  and discussed hematology referral.

## 2019-06-14 NOTE — Progress Notes (Signed)
Virtual Visit via Video Note  I connected with@  on 06/14/19 at 11:00 AM EST by a video enabled telemedicine application and verified that I am speaking with the correct person using two identifiers.  Location patient: home Location provider:work  Persons participating in the virtual visit: patient, provider  I discussed the limitations of evaluation and management by telemedicine and the availability of in person appointments. The patient expressed understanding and agreed to proceed. Interactive audio and video telecommunications were attempted between this provider and patient, however failed, due to patient having technical difficulties or patient did not have access to video capability.  We continued and completed visit with audio only.    HPI: Transfer of care Follow up  Anxiety- improved on wellbutrin. Does feel fatigued with stressors of health and work ( new job) late over the past several weeks. Some depression, although improved since last year. Had been on lexapro in the past.  No formal exercise; in the past had done hot yoga. Had seen counselor in the past.   Insomnia- lunesta works well for her in falling asleep. Sleeps well usually however Has some broken sleep at night.  Wakes up at normal time and doesn't think lunesta makes her groggy.  Continues to have pelvic floor pain-Following with urology,Dr Erlene Quan,  OB GYN, Dr Leafy Ro. Started to pelvic floor PT this week.   Weight loss which has 'leveled out' and not actively loosing weight. Not able to eat, loss of appetite.   On Yaz; mother had factor V leiden. No h/o DVT.  Also seeing gastroenterology appointment 06/21/19.   Blood pressure at home 107/66 which is normal for her.    ROS: See pertinent positives and negatives per HPI.  Past Medical History:  Diagnosis Date  . Asthma   . PCOS (polycystic ovarian syndrome)   . UTI (urinary tract infection)     Past Surgical History:  Procedure Laterality Date  .  BREAST ENHANCEMENT SURGERY  2012  . SHOULDER SURGERY    . TONSILLECTOMY      Family History  Problem Relation Age of Onset  . Arthritis Mother   . Heart disease Mother   . Stroke Mother   . Hypertension Mother   . Mental illness Mother   . Diabetes Mother   . Factor V Leiden deficiency Mother   . Sleep apnea Mother   . Mental illness Father   . Diabetes Father   . CAD Father   . Sleep apnea Father   . Crohn's disease Brother   . Kidney failure Paternal Grandfather   . Breast cancer Neg Hx   . Ovarian cancer Neg Hx   . Colon cancer Neg Hx     SOCIAL HX: never smoker   Current Outpatient Medications:  .  buPROPion (WELLBUTRIN XL) 300 MG 24 hr tablet, TAKE 1 TABLET(150 MG) BY MOUTH DAILY, Disp: 90 tablet, Rfl: 1 .  Eszopiclone 3 MG TABS, TAKE 1 TABLET BY MOUTH EVERY NIGHT AT BEDTIME, TAKE IMMEDIATELY BEFORE BEDTIME, Disp: 30 tablet, Rfl: 0 .  sertraline (ZOLOFT) 50 MG tablet, Take 1 tablet (50 mg total) by mouth at bedtime., Disp: 90 tablet, Rfl: 3  EXAM:  VITALS per patient if applicable: BP Readings from Last 3 Encounters:  06/08/19 (!) 162/125  07/02/18 (!) 100/50  06/15/18 106/71   Wt Readings from Last 3 Encounters:  06/14/19 126 lb (57.2 kg)  06/08/19 126 lb (57.2 kg)  02/21/19 126 lb (57.2 kg)    GENERAL: alert, oriented, appears  well and in no acute distress   ASSESSMENT AND PLAN:  Discussed the following assessment and plan:  Anxiety and depression - Plan: sertraline (ZOLOFT) 50 MG tablet, CBC with Differential/Platelet, Comprehensive metabolic panel, Hemoglobin A1c, Lipid panel, TSH, VITAMIN D 25 Hydroxy (Vit-D Deficiency, Fractures)  Insomnia, unspecified type - Plan: sertraline (ZOLOFT) 50 MG tablet  OCP (oral contraceptive pills) initiation - Plan: Protein C activity, Protein C, total, Protein S activity, Protein S, total, Antiphospholipid syndrome eval, bld, Antithrombin panel, APTT, Beta-2 glycoprotein antibodies, Cardiolipin antibodies, IgG,  IgM, IgA, CBC With Differential, Factor 5 leiden, IBC panel, Protein S activity, Prothrombin gene mutation, Protime-INR, PT and PTT, Von Willebrand panel, CBC with Differential/Platelet, Comprehensive metabolic panel, Hemoglobin A1c, Lipid panel, TSH, VITAMIN D 25 Hydroxy (Vit-D Deficiency, Fractures) Problem List Items Addressed This Visit      Other   Anxiety and depression    Exacerbated. I suspect that increased anxiety is complicated by undiagnosed pelvic pain,  fatigue and weight loss (? Related to GI , pending appointment or related to pelvic floor pain). Trial of zoloft. Close follow up to see if fatigue, anxiety improves on zoloft.        Relevant Medications   sertraline (ZOLOFT) 50 MG tablet   Other Relevant Orders   CBC with Differential/Platelet   Comprehensive metabolic panel   Hemoglobin A1c   Lipid panel   TSH   VITAMIN D 25 Hydroxy (Vit-D Deficiency, Fractures)   Insomnia    Some worsening.Increase fatigue of late.   however discussed that she would benefit from SSRI, trial of zoloft as well. Will continue lunesta. Pending labs      Relevant Medications   sertraline (ZOLOFT) 50 MG tablet   OCP (oral contraceptive pills) initiation    Advised to STOP birth control and to use barrier methods for birth control until this has been evaluated. Mother has factor v leiden. Pending anticoagulation panel  and discussed hematology referral.       Relevant Orders   Protein C activity   Protein C, total   Protein S activity   Protein S, total   Antiphospholipid syndrome eval, bld   Antithrombin panel   APTT   Beta-2 glycoprotein antibodies   Cardiolipin antibodies, IgG, IgM, IgA   CBC With Differential   Factor 5 leiden   IBC panel   Protein S activity   Prothrombin gene mutation   Protime-INR   PT and PTT   Von Willebrand panel   CBC with Differential/Platelet   Comprehensive metabolic panel   Hemoglobin A1c   Lipid panel   TSH   VITAMIN D 25 Hydroxy (Vit-D  Deficiency, Fractures)      -we discussed possible serious and likely etiologies, options for evaluation and workup, limitations of telemedicine visit vs in person visit, treatment, treatment risks and precautions. Pt prefers to treat via telemedicine empirically rather then risking or undertaking an in person visit at this moment. Patient agrees to seek prompt in person care if worsening, new symptoms arise, or if is not improving with treatment.   I discussed the assessment and treatment plan with the patient. The patient was provided an opportunity to ask questions and all were answered. The patient agreed with the plan and demonstrated an understanding of the instructions.   The patient was advised to call back or seek an in-person evaluation if the symptoms worsen or if the condition fails to improve as anticipated.   Rennie Plowman, FNP   I have spent 45  minutes with a patient including precharting, exam, reviewing medical records, and discussion plan of care.

## 2019-06-15 ENCOUNTER — Other Ambulatory Visit: Payer: Self-pay

## 2019-06-15 DIAGNOSIS — F419 Anxiety disorder, unspecified: Secondary | ICD-10-CM

## 2019-06-15 MED ORDER — BUPROPION HCL ER (XL) 300 MG PO TB24: ORAL_TABLET | ORAL | 1 refills | Status: DC

## 2019-06-16 ENCOUNTER — Ambulatory Visit: Payer: Managed Care, Other (non HMO) | Admitting: Physical Therapy

## 2019-06-21 ENCOUNTER — Other Ambulatory Visit (HOSPITAL_COMMUNITY): Payer: Self-pay | Admitting: Gastroenterology

## 2019-06-21 ENCOUNTER — Other Ambulatory Visit: Payer: Self-pay | Admitting: *Deleted

## 2019-06-21 ENCOUNTER — Other Ambulatory Visit: Payer: Self-pay | Admitting: Gastroenterology

## 2019-06-21 DIAGNOSIS — R634 Abnormal weight loss: Secondary | ICD-10-CM

## 2019-06-21 DIAGNOSIS — Z30011 Encounter for initial prescription of contraceptive pills: Secondary | ICD-10-CM

## 2019-06-21 DIAGNOSIS — R1084 Generalized abdominal pain: Secondary | ICD-10-CM

## 2019-06-21 NOTE — Addendum Note (Signed)
Addended by: Warden Fillers on: 06/21/2019 03:22 PM   Modules accepted: Orders

## 2019-06-23 ENCOUNTER — Encounter: Payer: Self-pay | Admitting: Physical Therapy

## 2019-06-23 ENCOUNTER — Other Ambulatory Visit: Payer: Self-pay

## 2019-06-23 ENCOUNTER — Other Ambulatory Visit (INDEPENDENT_AMBULATORY_CARE_PROVIDER_SITE_OTHER): Payer: Managed Care, Other (non HMO)

## 2019-06-23 ENCOUNTER — Ambulatory Visit: Payer: Managed Care, Other (non HMO) | Attending: Obstetrics and Gynecology | Admitting: Physical Therapy

## 2019-06-23 DIAGNOSIS — R634 Abnormal weight loss: Secondary | ICD-10-CM

## 2019-06-23 DIAGNOSIS — M4125 Other idiopathic scoliosis, thoracolumbar region: Secondary | ICD-10-CM | POA: Diagnosis present

## 2019-06-23 DIAGNOSIS — M62838 Other muscle spasm: Secondary | ICD-10-CM | POA: Diagnosis present

## 2019-06-23 DIAGNOSIS — M533 Sacrococcygeal disorders, not elsewhere classified: Secondary | ICD-10-CM

## 2019-06-23 DIAGNOSIS — F419 Anxiety disorder, unspecified: Secondary | ICD-10-CM

## 2019-06-23 DIAGNOSIS — R29898 Other symptoms and signs involving the musculoskeletal system: Secondary | ICD-10-CM | POA: Insufficient documentation

## 2019-06-23 DIAGNOSIS — R195 Other fecal abnormalities: Secondary | ICD-10-CM

## 2019-06-23 DIAGNOSIS — Z30011 Encounter for initial prescription of contraceptive pills: Secondary | ICD-10-CM | POA: Diagnosis not present

## 2019-06-23 DIAGNOSIS — R1084 Generalized abdominal pain: Secondary | ICD-10-CM

## 2019-06-23 DIAGNOSIS — R278 Other lack of coordination: Secondary | ICD-10-CM

## 2019-06-23 DIAGNOSIS — F329 Major depressive disorder, single episode, unspecified: Secondary | ICD-10-CM

## 2019-06-23 DIAGNOSIS — F32A Depression, unspecified: Secondary | ICD-10-CM

## 2019-06-23 NOTE — Patient Instructions (Signed)
Prone Heel Press for strengthening sacro-iliac joints  1. Lie on your belly. If you have an arch in your low back or it feels umcomfortable, place a pillow under your low belly/hips to make sure your low back feel comfortable.   2. Place our forehead on top of your palms.      Widen your knees apart for starting position.   3. Inhale, feel belly and low back expand  4. Exhale, feel belly hug in, press heel together and count aloud for 5 sec. Then relax the heel squeezing.  Perform 10 reps of 5 sec holds. 2 sets/ day.    If you feel entire buttock tighten too much or feel low back pain, apply 50% less effort. As you press your heel together, you will feel as if your pubic bone (front of your pelvis) and sacrum (back of your pelvis) gentle move towards each other or your low abdominal muscles hug in more.        childs rocking  5 reps  ___  Open book on the R side only, ( L sidelying)  ( handout)   ___  Sitting with feet flat on floor under knees, hip width apart  Sitting on sitting bones and not the tailbone   ____

## 2019-06-24 ENCOUNTER — Other Ambulatory Visit (INDEPENDENT_AMBULATORY_CARE_PROVIDER_SITE_OTHER): Payer: Managed Care, Other (non HMO)

## 2019-06-24 ENCOUNTER — Telehealth: Payer: Self-pay | Admitting: Family

## 2019-06-24 DIAGNOSIS — R195 Other fecal abnormalities: Secondary | ICD-10-CM | POA: Diagnosis not present

## 2019-06-24 LAB — COMPREHENSIVE METABOLIC PANEL
ALT: 23 U/L (ref 0–35)
AST: 15 U/L (ref 0–37)
Albumin: 4.1 g/dL (ref 3.5–5.2)
Alkaline Phosphatase: 47 U/L (ref 39–117)
BUN: 7 mg/dL (ref 6–23)
CO2: 26 mEq/L (ref 19–32)
Calcium: 9.2 mg/dL (ref 8.4–10.5)
Chloride: 103 mEq/L (ref 96–112)
Creatinine, Ser: 0.74 mg/dL (ref 0.40–1.20)
GFR: 91.68 mL/min (ref >60.00)
Glucose, Bld: 82 mg/dL (ref 70–99)
Potassium: 3.6 mEq/L (ref 3.5–5.1)
Sodium: 138 mEq/L (ref 135–145)
Total Bilirubin: 0.3 mg/dL (ref 0.2–1.2)
Total Protein: 6.6 g/dL (ref 6.0–8.3)

## 2019-06-24 LAB — CBC WITH DIFFERENTIAL/PLATELET
Basophils Absolute: 0.1 10*3/uL (ref 0.0–0.1)
Basophils Relative: 1 % (ref 0.0–3.0)
Eosinophils Absolute: 0.1 10*3/uL (ref 0.0–0.7)
Eosinophils Relative: 1.7 % (ref 0.0–5.0)
HCT: 41.5 % (ref 36.0–46.0)
Hemoglobin: 14.2 g/dL (ref 12.0–15.0)
Lymphocytes Relative: 42.3 % (ref 12.0–46.0)
Lymphs Abs: 3 10*3/uL (ref 0.7–4.0)
MCHC: 34.4 g/dL (ref 30.0–36.0)
MCV: 97.7 fl (ref 78.0–100.0)
Monocytes Absolute: 0.4 10*3/uL (ref 0.1–1.0)
Monocytes Relative: 6.1 % (ref 3.0–12.0)
Neutro Abs: 3.5 10*3/uL (ref 1.4–7.7)
Neutrophils Relative %: 48.9 % (ref 43.0–77.0)
Platelets: 346 10*3/uL (ref 150.0–400.0)
RBC: 4.24 Mil/uL (ref 3.87–5.11)
RDW: 13.1 % (ref 11.5–15.5)
WBC: 7.2 10*3/uL (ref 4.0–10.5)

## 2019-06-24 LAB — IBC PANEL
Iron: 70 ug/dL (ref 42–145)
Saturation Ratios: 18.2 % — ABNORMAL LOW (ref 20.0–50.0)
Transferrin: 274 mg/dL (ref 212.0–360.0)

## 2019-06-24 LAB — CP4508-PT/INR AND PTT
INR: 1
Prothrombin Time: 10.5 s (ref 9.0–11.5)
aPTT: 27 s (ref 23–32)

## 2019-06-24 LAB — LIPID PANEL
Cholesterol: 247 mg/dL — ABNORMAL HIGH (ref 0–200)
HDL: 65.9 mg/dL (ref >39.00)
LDL Cholesterol: 162 mg/dL — ABNORMAL HIGH (ref 0–99)
NonHDL: 180.66
Total CHOL/HDL Ratio: 4
Triglycerides: 91 mg/dL (ref 0.0–149.0)
VLDL: 18.2 mg/dL (ref 0.0–40.0)

## 2019-06-24 LAB — C-REACTIVE PROTEIN: CRP: <1 mg/dL (ref 0.5–20.0)

## 2019-06-24 LAB — HEMOGLOBIN A1C: Hgb A1c MFr Bld: 5.1 % (ref 4.6–6.5)

## 2019-06-24 LAB — SEDIMENTATION RATE: Sed Rate: 4 mm/hr (ref 0–20)

## 2019-06-24 LAB — TSH: TSH: 0.68 u[IU]/mL (ref 0.35–4.50)

## 2019-06-24 LAB — APTT: aPTT: 33.6 s — ABNORMAL HIGH (ref 23.4–32.7)

## 2019-06-24 LAB — VITAMIN D 25 HYDROXY (VIT D DEFICIENCY, FRACTURES): VITD: 55.91 ng/mL (ref 30.00–100.00)

## 2019-06-24 NOTE — Addendum Note (Signed)
Addended by: Warden Fillers on: 06/24/2019 11:24 AM   Modules accepted: Orders

## 2019-06-24 NOTE — Therapy (Signed)
[facility] MAIN [facility] SERVICES [address] [place], [place], [address] Phone: [phone]   Fax:  [phone]  Physical Therapy Evaluation  Patient Details  Name: [name] MRN: [id number] Date of Birth: 1990/09/01 Referring Provider (PT): Dalbert Garnet   Encounter Date: 06/23/2019  PT End of Session - 06/23/19 1735    Visit Number  1    Number of Visits  10    Date for PT Re-Evaluation  09/01/19   eval 06/23/19   PT Start Time  1702    PT Stop Time  1815    PT Time Calculation (min)  73 min       Past Medical History:  Diagnosis Date  . Asthma   . PCOS (polycystic ovarian syndrome)   . UTI (urinary tract infection)     Past Surgical History:  Procedure Laterality Date  . BREAST ENHANCEMENT SURGERY  2012  . SHOULDER SURGERY    . TONSILLECTOMY      There were no vitals filed for this visit.   Subjective Assessment - 06/23/19 1713    Subjective 1) Pelvic pain started a year ago. Pt got sick from a virus after coming back from a cruise. ( sore throat, headache, fever, flu like Sx early Dec 2019.  Pt recovered from the flu like Sx but then experienced ovary like pelvic pain  which made pt think it was a cyst rupture. PT went to the ED and imaging showed no rupture of cyst and no cause for the pain. It took 3 days for pt to sit up right and sit up right again without pain. The pelvic pain changed in location and symptoms after this 3 day period. It was never constant and it comes and goes. Sometimes before , after sex, sometimes it feels like it is UTI , an allergic reaction on the skin. The pain is worse after eating red meat and fired foods make her throw up. Pt has cut these foods out of her diet completely. For a while pt was chasing an infection in her body ( lymph node, skin infection). Pt took antibiotics constantly for 3 months. Sometimes there is pain in the R groin that feels like a knot, and sometimes it feels like kidney pain.  Easing factors: Tylenol, Ibuprofen. Occasionally urinary leakage occurs.   2) constipation started one year ago. BMs occurs every 3 days with small pellets. Daily fluid intake: 48 fl oz water, 1-3 cups of black coffee with a little creamer ( 3 cups for 3 days out of the week).     3) CLBP prior to the pelvic pain onset: achey pain B sides. Soemtimes stabbing pain under the ribs and also along the L hip to behind the knee.  Pt travelled in the car a lot prior to Jan 2021. Pt gets Chiropractic Tx for this CLBP only when she feels she needs it. Before COVID, pt did hot yoga for one year 1 x week. In the last year, pt has not exercised. In the past, pt enjoyed walking.       Pertinent History  Two years ago, pt had a UTI that pt was hospitalized for because there was pus, blood. Pt recovered it after 7 days. Since menstrual cycles started at 29 years old, pt has had terrible cramps, severe clotting, headaches, irritability. Periods would occur irregularly. At one point, period did not occur for 6 months. Pt was on Yaz birth control pill but she has recently been  off of it due to mother having a clotting gene.  Pt reports that at the time when she travelled on the cruise, pt un intentionally stopped taking her birth control pills with 1.5 weeks of pills left in her pack because she left it on the ship. Her period came 3 days after the last time she took her pill. During this menstrual period is when she ended in the ED with the period like pelvic pain.  Surgeries: breast augmentation 2012, bicep muscle repair for rupture on R 2013, tonsilectomy    Patient Stated Goals  less pelvic pain , less cramping, and more core strength         [facility] PT Assessment - 06/24/19 1302      Assessment   Medical Diagnosis  chronic pelvic pain    Referring Provider (PT)  Dalbert Garnet      Precautions   Precautions  None      Restrictions   Weight Bearing Restrictions  No      Prior Function   Level of Independence   Independent      Observation/Other Assessments   Observations  forward head, bilateral stance with adducted knees, genu valgus, report of R groin pain      Scoliosis  L convex curve at upper lumbar, R iliac crest higher  ( post Tx: levelled iliac crest, less deviation of lumbar spine)       Strength   Overall Strength Comments  BLE hip flex, knee flex, ext 5/5 B, hip abd 3/5 B, hip ext 2+/5 L, 3/5 R with lumbar lordosis ( post Tx: hip ext B 4-/5)          Palpation   Spinal mobility  WFL AROM,  L sideflexion with LBP, ( post Tx: no pain)       SI assessment   supine: R ASIS lower ( post Tx: levelled) ,  R paraspinal more prominent  .  R SIJ mobility restricted w/ tenderness, lacking nutation/ iliac crest abd /ER         Scar restrictions under B breasts from augmentation procedure           Objective measurements completed on examination: See above findings.    Pelvic Floor Special Questions - 06/24/19 1304    Diastasis Recti  neg       [facility] Adult PT Treatment/Exercise - 06/24/19 1300      Neuro Re-ed    Neuro Re-ed Details   cued for proper sitting posture, technique for HEP      Manual Therapy   Manual therapy comments  RLE long axis distraction , rotational mob, superior glide at sacrum to promote nutation and iliac crest ER abd . STM/MWM at paraspinals R/ PA mob Grade III T10-L2                   PT Long Term Goals - 06/23/19 1736      PT LONG TERM GOAL #1   Title  Pt will report decreased pelvic pain epsiodes from 2 weeks/ month to < 1 week / month in order to increased QOL    Time  8    Period  Weeks    Status  New    Target Date  08/18/19      PT LONG TERM GOAL #2   Title  Pt will report decreased abdominal menstrual related cramping from 8/10 to < 4/10 in order demo self-management    Time  4  Period  Weeks    Status  New    Target Date  07/21/19      PT LONG TERM GOAL #3   Title  Pt's strength from increase from  hip abd 3/5 B, hip  ext 2+/5 L, 3/5 R with lumbar lordosis to hip abd/ ext 4+/5 B  in order minimize pain and walk for aerobic exercise    Time  8    Period  Weeks    Status  New    Target Date  08/19/19      PT LONG TERM GOAL #4   Title  Pt will demo equal iliac crest and no lumbar convex curve across 2 sessions in order to progress to deep core strengthening exercises    Time  2    Period  Weeks    Status  New    Target Date  07/22/19      PT LONG TERM GOAL #5   Title  Pt will demo proper body mechanics to maintain SIJ / spinal alignment and to minimize straining pelvic floor in order to minimize relapse of Sx    Time  6    Period  Weeks    Status  New    Target Date  08/05/19      Additional Long Term Goals   Additional Long Term Goals  Yes      PT LONG TERM GOAL #6   Title  Pt will report increased bowel regularity from every 3 days to daily bowel movements in order to promote GI health    Time  5    Period  Weeks    Status  New    Target Date  07/29/19      PT LONG TERM GOAL #7   Title  Pt will demo increased score on FOTO with Urinary function 71 pts to > 90 pt, Constipation 55 pts to > 70 pts, and decreased score with Pain from 38 pts to < 28 pts in order to demo improved function and improve QOL    Time  10    Period  Weeks    Status  New    Target Date  09/02/19      PT LONG TERM GOAL #8   Title  Pt will report no pain with sexual intercourse in order to improve QOL    Time  10    Period  Weeks    Status  New    Target Date  09/02/19             Plan - 06/24/19 1304    Clinical Impression Statement  Pt is a 29 yo female who reports pelvic pain and constipation that started 1 year ago and also reports CLBP which has existed prior to the onset of her pelvic pain/ constipation. These Sx impact her QOL.   Her musculoskeletal assessment revealed _pelvic obliquity, lumbar convex curve _ weak hip weakness, poor body mechanics which places strain on the abdominal/pelvic floor  mm _pain with L spinal sideflexion _restricted R SIJ mobility with tenderness  _increased scar restrictions under breasts from augmentation procedure  _genu valgus of knees   These are deficits that indicate an ineffective intraabdominal pressure system associated with increased risk for Sx. Pt's Hx of breast augmentation can be a contributing factor and pt will require more thoracolumbar /posterior leg and deep core strengthening to counteract the increased weight in anterior upper body. Pt's past Hx of painful menstrual cycles also play a  role on her Sx. Continue to monitor pt's Sx and will refer pt out if musculoskeletal improvements do not yield a positive change in her Sx.    Today, pt was provided education on etiology of Sx with anatomy, physiology explanation with images along with the benefits of customized pelvic PT Tx based on pt's medical conditions and musculoskeletal deficits.  Explained the physiology of deep core mm coordination and roles of pelvic floor function in urination, defecation, sexual function, and postural control with deep core mm system.  Following Tx today which pt tolerated without complaints, pt demo'd equal alignment of pelvic girdle and increased spinal mobility. Pt reported R groin pain decreased from 4/10 to 1/10 . Her LBP pain decreased 2/10 to 0/10 with L sideflexion. Pt demo'd understanding for sitting posture to minimize pelvic obliquities and discussed with pt on not bending knees/ sitting on legs at work.   Pt benefits from skilled PT    Examination-Activity Limitations  Continence;Other;Hygiene/Grooming    Stability/Clinical Decision Making  Evolving/Moderate complexity    Clinical Decision Making  High    Rehab Potential  Good    PT Frequency  1x / week    PT Duration  Other (comment)   10   PT Treatment/Interventions  Therapeutic activities;Functional mobility training;Moist Heat;Gait training;Therapeutic exercise;Balance training;Patient/family  education;Neuromuscular re-education;Manual techniques;Manual lymph drainage;Scar mobilization    Consulted and Agree with Plan of Care  Patient       Patient will benefit from skilled therapeutic intervention in order to improve the following deficits and impairments:  Decreased activity tolerance, Decreased coordination, Decreased knowledge of use of DME, Decreased safety awareness, Increased fascial restricitons, Impaired flexibility, Postural dysfunction, Improper body mechanics, Decreased range of motion, Decreased knowledge of precautions, Decreased scar mobility, Decreased endurance, Decreased balance, Decreased skin integrity, Decreased mobility, Hypomobility, Increased muscle spasms, Impaired sensation  Visit Diagnosis: Other lack of coordination  Other muscle spasm  Other symptoms and signs involving the musculoskeletal system     Problem List Patient Active Problem List   Diagnosis Date Noted  . OCP (oral contraceptive pills) initiation 06/14/2019  . Anxiety and depression 12/30/2017  . PCOS (polycystic ovarian syndrome) 12/30/2017  . Insomnia 12/30/2017  . Rash 09/18/2016  . History of UTI 06/17/2016  . Heel pain 01/15/2016    Jerl Mina ,PT, DPT, E-RYT  06/24/2019, 1:17 PM  [place] MAIN [facility] SERVICES [address] [place], [place], [address] Phone: [phone]   Fax:  [phone]  Name: [name] MRN: [id number] Date of Birth: May 02, 1990

## 2019-06-24 NOTE — Telephone Encounter (Signed)
Call pt I have ordered these labs on behalf of [facility]. She will need to come back in for these  Please advise pt that I will not interpret as these are not labs that I interpret  We can give results to patient so she can share with GI

## 2019-06-24 NOTE — Telephone Encounter (Signed)
I called patient to advise on below but VM box was full & was unable to leave message.

## 2019-06-27 NOTE — Telephone Encounter (Signed)
Just FYI [facility] called & said disregard any message that they did not receive serum sample. They did receive over the weekend & results will be sent.

## 2019-06-28 ENCOUNTER — Encounter: Payer: Self-pay | Admitting: Family

## 2019-06-28 ENCOUNTER — Other Ambulatory Visit: Payer: Self-pay | Admitting: Family

## 2019-06-28 DIAGNOSIS — Z30011 Encounter for initial prescription of contraceptive pills: Secondary | ICD-10-CM

## 2019-06-28 LAB — COAG STUDIES INTERP REPORT

## 2019-06-28 LAB — ANTIPHOSPHOLIPID SYNDROME EVAL, BLD
APTT PPP: 26.1 s (ref 22.9–30.2)
Anticardiolipin IgG: <9 GPL U/mL (ref 0–14)
Anticardiolipin IgM: <9 MPL U/mL (ref 0–12)
Beta-2 Glyco 1 IgM: <9 GPI IgM units (ref 0–32)
Beta-2 Glyco I IgG: <9 GPI IgG units (ref 0–20)
Dilute Viper Venom Time: 33.7 s (ref 0.0–47.0)
Hexagonal Phase Phospholipid: 2 s (ref 0–11)
INR: 1 (ref 0.9–1.2)
PT: 11 s (ref 9.1–12.0)
Thrombin Time: 18.7 s (ref 0.0–23.0)

## 2019-06-28 LAB — ANTITHROMBIN PANEL
AT III AG PPP IMM-ACNC: 91 % (ref 72–124)
AntiThromb III Func: 117 % (ref 75–135)

## 2019-06-29 ENCOUNTER — Other Ambulatory Visit: Payer: Self-pay | Admitting: Family

## 2019-06-29 DIAGNOSIS — G4700 Insomnia, unspecified: Secondary | ICD-10-CM

## 2019-06-30 ENCOUNTER — Encounter: Payer: Self-pay | Admitting: Family

## 2019-06-30 ENCOUNTER — Other Ambulatory Visit: Payer: Self-pay | Admitting: Family

## 2019-06-30 ENCOUNTER — Encounter: Payer: Managed Care, Other (non HMO) | Admitting: Physical Therapy

## 2019-06-30 ENCOUNTER — Encounter: Payer: Self-pay | Admitting: Oncology

## 2019-06-30 DIAGNOSIS — G4700 Insomnia, unspecified: Secondary | ICD-10-CM

## 2019-06-30 LAB — CARDIOLIPIN ANTIBODIES, IGG, IGM, IGA
Anticardiolipin IgA: <11 [APL'U]
Anticardiolipin IgG: <14 [GPL'U]
Anticardiolipin IgM: <12 [MPL'U]

## 2019-06-30 LAB — FACTOR 5 LEIDEN

## 2019-06-30 LAB — PROTEIN C, TOTAL: PROTEIN C, ANTIGEN: 127 % (ref 70–140)

## 2019-06-30 LAB — PROTEIN S ACTIVITY: Protein S Activity: 67 % (ref 60–140)

## 2019-06-30 LAB — PROTHROMBIN GENE MUTATION

## 2019-06-30 LAB — BETA-2 GLYCOPROTEIN ANTIBODIES
Beta-2 Glyco 1 IgA: <9 SAU (ref <=20)
Beta-2 Glyco 1 IgM: <9 SMU (ref <=20)
Beta-2 Glyco I IgG: <9 SGU (ref <=20)

## 2019-06-30 LAB — VON WILLEBRAND PANEL: aPTT: 31 s (ref 23–32)

## 2019-06-30 LAB — PROTEIN S, TOTAL: PROTEIN S ANTIGEN, TOTAL: 82 % normal (ref 70–140)

## 2019-06-30 LAB — PROTEIN C ACTIVITY: Protein C Activity: 165 % (ref 70–180)

## 2019-06-30 NOTE — Progress Notes (Signed)
Patient contacted regarding hematology referral. Patient is aware. No questions or concerns voiced.

## 2019-06-30 NOTE — Telephone Encounter (Signed)
I believe this is a duplicate. I keep getting electronic request for medication refill.

## 2019-07-01 ENCOUNTER — Ambulatory Visit: Payer: Managed Care, Other (non HMO)

## 2019-07-01 ENCOUNTER — Telehealth: Payer: Self-pay | Admitting: Family

## 2019-07-01 DIAGNOSIS — G4700 Insomnia, unspecified: Secondary | ICD-10-CM

## 2019-07-01 LAB — CELIAC DISEASE COMPREHENSIVE PANEL WITH REFLEXES
(tTG) Ab, IgA: 1 U/mL
Immunoglobulin A: 89 mg/dL (ref 47–310)

## 2019-07-01 LAB — ALPHA-GAL PANEL
Beef IgE: <0.1 kU/L (ref <0.35)
Class: 0
Class: 0
Class: 0
Galactose-alpha-1,3-galactose IgE: <0.1 kU/L (ref <0.10)
LAMB/MUTTON IGE: <0.1 kU/L (ref <0.35)
Pork IgE: <0.1 kU/L (ref <0.35)

## 2019-07-01 MED ORDER — ESZOPICLONE 3 MG PO TABS: ORAL_TABLET | ORAL | 1 refills | Status: DC

## 2019-07-01 NOTE — Telephone Encounter (Signed)
I looked up patient on [place] Controlled Substances Reporting System and saw no activity that raised concern of inappropriate use.   I have shredded printed lunesta rx that was on my desk which was printed by Elise Benne

## 2019-07-04 ENCOUNTER — Inpatient Hospital Stay: Payer: Managed Care, Other (non HMO) | Attending: Oncology | Admitting: Oncology

## 2019-07-04 ENCOUNTER — Other Ambulatory Visit: Payer: Self-pay

## 2019-07-04 VITALS — BP 126/84 | HR 85 | Temp 98.4°F | Wt 127.2 lb

## 2019-07-04 DIAGNOSIS — R5383 Other fatigue: Secondary | ICD-10-CM | POA: Diagnosis not present

## 2019-07-04 DIAGNOSIS — K5900 Constipation, unspecified: Secondary | ICD-10-CM | POA: Insufficient documentation

## 2019-07-04 DIAGNOSIS — R1013 Epigastric pain: Secondary | ICD-10-CM | POA: Diagnosis not present

## 2019-07-04 DIAGNOSIS — D6852 Prothrombin gene mutation: Secondary | ICD-10-CM

## 2019-07-04 DIAGNOSIS — R634 Abnormal weight loss: Secondary | ICD-10-CM | POA: Diagnosis not present

## 2019-07-04 DIAGNOSIS — Z79899 Other long term (current) drug therapy: Secondary | ICD-10-CM | POA: Diagnosis not present

## 2019-07-04 DIAGNOSIS — R102 Pelvic and perineal pain: Secondary | ICD-10-CM | POA: Insufficient documentation

## 2019-07-04 NOTE — Progress Notes (Signed)
Hematology/Oncology Consult note [facility] [phone] [phone]   Patient Care Team: [name], FNP as PCP - General (Family Medicine)  REFERRING PROVIDER: [name], FNP  CHIEF COMPLAINTS/REASON FOR VISIT:  Evaluation of prothrombin gene mutation.   HISTORY OF PRESENTING ILLNESS:   [name] is a  29 y.o.  female with PMH listed below was seen in consultation at the request of  [name], FNP  for evaluation of prothrombin gene mutation. Patient transferred care to current primary care provider recently. She reports that her mother had factor V Leiden mutation, multiple stroke and heart disease.  Father is currently being tested.  Patient was on oral contraceptive pills. Hypercoagulable work-up was done given patient's family history of factor V Leiden mutation, CAD and stroke. Normal protein C and S activity, total protein C and S antigen, normal anti thrombin level, negative cardiolipin antibodies, nnegative beta glycoprotein antibodies, negative factor 5 leiden mutation.  Patient has tested positive for 1 copy of G20210A mutation of the prothrombin gene. Patient was referred to cancer center for further counseling. Patient reports a history of intermittent pelvic pain, dysuria and UTI symptoms.  She sees urology Dr. Apolinar Junes for urinary tract symptoms.  She sees Dr. Dalbert Garnet for pelvic pain syndrome. She was also seen recently by gastroenterology Dr. [name] for constipation dyspepsia and weight loss.  Scheduled for CT abdomen pelvis.  Review of Systems  Constitutional: Positive for fatigue. Negative for appetite change, chills and fever.  HENT:   Negative for hearing loss and voice change.   Eyes: Negative for eye problems.  Respiratory: Negative for chest tightness and cough.   Cardiovascular: Negative for chest pain.  Gastrointestinal: Negative for abdominal distention, abdominal pain and blood in stool.   Endocrine: Negative for hot flashes.  Genitourinary: Negative for difficulty urinating and frequency.   Musculoskeletal: Positive for arthralgias.  Skin: Negative for itching and rash.  Neurological: Negative for extremity weakness.  Hematological: Negative for adenopathy.  Psychiatric/Behavioral: Negative for confusion.    MEDICAL HISTORY:  Past Medical History:  Diagnosis Date  . Asthma   . PCOS (polycystic ovarian syndrome)   . UTI (urinary tract infection)     SURGICAL HISTORY: Past Surgical History:  Procedure Laterality Date  . BREAST ENHANCEMENT SURGERY  2012  . SHOULDER SURGERY    . TONSILLECTOMY      SOCIAL HISTORY: Social History   Socioeconomic History  . Marital status: Married    Spouse name: Not on file  . Number of children: Not on file  . Years of education: Not on file  . Highest education level: Not on file  Occupational History  . Not on file  Tobacco Use  . Smoking status: Never Smoker  . Smokeless tobacco: Never Used  Substance and Sexual Activity  . Alcohol use: Not Currently  . Drug use: No  . Sexual activity: Yes    Birth control/protection: Pill  Other Topics Concern  . 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       Social Determinants of Health   Financial Resource Strain:   . Difficulty of Paying Living Expenses: Not on file  Food Insecurity:   . Worried About Programme researcher, broadcasting/film/video in the Last Year: Not on file  . Ran Out of Food in the Last Year: Not on file  Transportation Needs:   . Lack of Transportation (Medical): Not  on file  . Lack of Transportation (Non-Medical): Not on file  Physical Activity:   . Days of Exercise per Week: Not on file  . Minutes of Exercise per Session: Not on file  Stress:   . Feeling of Stress : Not on file  Social Connections:   . Frequency of Communication with Friends and Family: Not on file  . Frequency of Social Gatherings with  Friends and Family: Not on file  . Attends Religious Services: Not on file  . Active Member of Clubs or Organizations: Not on file  . Attends Banker Meetings: Not on file  . Marital Status: Not on file  Intimate Partner Violence:   . Fear of Current or Ex-Partner: Not on file  . Emotionally Abused: Not on file  . Physically Abused: Not on file  . Sexually Abused: Not on file    FAMILY HISTORY: Family History  Problem Relation Age of Onset  . Arthritis Mother   . Heart disease Mother   . Stroke Mother   . Hypertension Mother   . Mental illness Mother   . Diabetes Mother   . Factor V Leiden deficiency Mother   . Sleep apnea Mother   . Mental illness Father   . Diabetes Father   . CAD Father   . Sleep apnea Father   . Crohn's disease Brother   . Kidney failure Paternal Grandfather   . Breast cancer Neg Hx   . Ovarian cancer Neg Hx   . Colon cancer Neg Hx     ALLERGIES:  is allergic to aspirin and levaquin [levofloxacin in d5w].  MEDICATIONS:  Current Outpatient Medications  Medication Sig Dispense Refill  . buPROPion (WELLBUTRIN XL) 300 MG 24 hr tablet TAKE 1 TABLET(150 MG) BY MOUTH DAILY 90 tablet 1  . Eszopiclone 3 MG TABS TAKE 1 TABLET BY MOUTH IMMEDIATELY BEFORE BEDTIME 30 tablet 1  . omeprazole (PRILOSEC) 20 MG capsule Take 20 mg by mouth daily.    . sertraline (ZOLOFT) 50 MG tablet Take 1 tablet (50 mg total) by mouth at bedtime. 90 tablet 3   No current facility-administered medications for this visit.     PHYSICAL EXAMINATION: ECOG PERFORMANCE STATUS: 1 - Symptomatic but completely ambulatory Vitals:   07/04/19 1445  BP: 126/84  Pulse: 85  Temp: 98.4 F (36.9 C)   Filed Weights   07/04/19 1445  Weight: 127 lb 3.2 oz (57.7 kg)    Physical Exam Constitutional:      General: She is not in acute distress. HENT:     Head: Normocephalic and atraumatic.  Eyes:     General: No scleral icterus. Cardiovascular:     Rate and Rhythm: Normal  rate and regular rhythm.     Heart sounds: Normal heart sounds.  Pulmonary:     Effort: Pulmonary effort is normal. No respiratory distress.     Breath sounds: No wheezing.  Abdominal:     General: Bowel sounds are normal. There is no distension.     Palpations: Abdomen is soft.  Musculoskeletal:        General: No deformity. Normal range of motion.     Cervical back: Normal range of motion and neck supple.  Skin:    General: Skin is warm and dry.     Findings: No erythema or rash.  Neurological:     Mental Status: She is alert and oriented to person, place, and time. Mental status is at baseline.     Cranial  Nerves: No cranial nerve deficit.     Coordination: Coordination normal.  Psychiatric:        Mood and Affect: Mood normal.     LABORATORY DATA:  I have reviewed the data as listed Lab Results  Component Value Date   WBC 7.2 06/23/2019   HGB 14.2 06/23/2019   HCT 41.5 06/23/2019   MCV 97.7 06/23/2019   PLT 346.0 06/23/2019   Recent Labs    08/20/18 1528 06/23/19 1523  NA 137 138  K 3.7 3.6  CL 105 103  CO2 24 26  GLUCOSE 71 82  BUN 7 7  CREATININE 0.72 0.74  CALCIUM 9.0 9.2  PROT 6.4 6.6  ALBUMIN  --  4.1  AST 13 15  ALT 18 23  ALKPHOS  --  47  BILITOT 0.5 0.3   Iron/TIBC/Ferritin/ %Sat    Component Value Date/Time   IRON 70 06/23/2019 1523   IRONPCTSAT 18.2 (L) 06/23/2019 1523      RADIOGRAPHIC STUDIES: I have personally reviewed the radiological images as listed and agreed with the findings in the report. No results found.    ASSESSMENT & PLAN:  1. Prothrombin gene mutation ([place])    Diagnosis of prothrombin gene mutation carrier was discussed with patient.  Heterozygous mutation. Prothrombin G20210A is the second most common inherited thrombophilia after factor V Leiden. Discussed with patient that the risk of venous thromboembolism individuals who are heterozygous for prothrombin [id number] has been estimated to be increased approximately  3-4 fold.  Sites of the thrombosis are typically deep leg veins, pulmonary embolism, and other sites including portal, hepatic, mesenteric vein thrombosis, cerebral vein thrombosis have been reported.  This mutation does not appear to be a major risk factor for arterial thrombosis. Patient has no history of previous VTEs. Asymptomatic patient are not treated with routine anticoagulation. I recommend patient to avoid estrogen containing contraceptives. I provided education regarding conditions that increase thrombotic risks. Patient is in the reproductive age  in the setting of pregnancy, surgery, acute medical illness, prophylactic anticoagulation may be considered. In the setting of pregnancy in the future, patient has heterozygous prothrombin mutation which is a lower risk type thrombophilia, recommend surveillance for VTE without anticoagulation. anticoagulation may be warranted for individual patients with additional factors that place them at greater risk of thrombosis (eg, prolonged immobility, first-degree relative with unprovoked VTE under age 28 years), Anticoagulation (prophylactic dose) for women who deliver by cesarean  Recommend testing first-degree relatives. She will be discharged to follow up with primary care provider.   All questions were answered. The patient knows to call the clinic with any problems questions or concerns. If she develops new symptoms or concerns, she can schedule another appointment for counseling.  cc Burnard Hawthorne, FNP   Thank you for this kind referral and the opportunity to participate in the care of this patient. A copy of today's note is routed to referring provider    Earlie Server, MD, PhD Hematology Oncology [facility] Pager- [phone] 07/04/2019

## 2019-07-05 ENCOUNTER — Telehealth: Payer: Self-pay | Admitting: Family

## 2019-07-05 ENCOUNTER — Other Ambulatory Visit: Payer: Self-pay | Admitting: Family

## 2019-07-05 ENCOUNTER — Encounter: Payer: Self-pay | Admitting: Family

## 2019-07-05 ENCOUNTER — Ambulatory Visit
Admission: RE | Admit: 2019-07-05 | Discharge: 2019-07-05 | Disposition: A | Discharge status: Home / Self Care | Payer: Managed Care, Other (non HMO) | Source: Ambulatory Visit | Attending: Gastroenterology | Admitting: Gastroenterology

## 2019-07-05 DIAGNOSIS — R1084 Generalized abdominal pain: Secondary | ICD-10-CM | POA: Insufficient documentation

## 2019-07-05 DIAGNOSIS — R634 Abnormal weight loss: Secondary | ICD-10-CM | POA: Insufficient documentation

## 2019-07-05 DIAGNOSIS — D6852 Prothrombin gene mutation: Secondary | ICD-10-CM | POA: Insufficient documentation

## 2019-07-05 MED ORDER — IOHEXOL 300 MG/ML  SOLN
75.0000 mL | Freq: Once | INTRAMUSCULAR | Status: AC | PRN
  Administered 2019-07-05: 75 mL via INTRAVENOUS

## 2019-07-05 NOTE — Telephone Encounter (Signed)
I called patient to inform of below. She was agreeable to having lab checked & I have scheduled for her.

## 2019-07-05 NOTE — Telephone Encounter (Signed)
Call patient I am glad thatt she was able to see Dr. Cathie Hoops yesterday.  I still been tracking her blood work that we have done.  There is one test that was not performed, von Willebrand panel.  If she would like to have this done, which I would recommend for completion, she may call the office to schedule this lab.

## 2019-07-07 ENCOUNTER — Ambulatory Visit: Payer: Managed Care, Other (non HMO) | Attending: Obstetrics and Gynecology | Admitting: Physical Therapy

## 2019-07-07 ENCOUNTER — Other Ambulatory Visit: Payer: Self-pay

## 2019-07-07 DIAGNOSIS — R278 Other lack of coordination: Secondary | ICD-10-CM | POA: Insufficient documentation

## 2019-07-07 DIAGNOSIS — R29898 Other symptoms and signs involving the musculoskeletal system: Secondary | ICD-10-CM | POA: Diagnosis present

## 2019-07-07 DIAGNOSIS — M533 Sacrococcygeal disorders, not elsewhere classified: Secondary | ICD-10-CM | POA: Diagnosis present

## 2019-07-07 DIAGNOSIS — M62838 Other muscle spasm: Secondary | ICD-10-CM | POA: Diagnosis present

## 2019-07-07 DIAGNOSIS — M4125 Other idiopathic scoliosis, thoracolumbar region: Secondary | ICD-10-CM | POA: Insufficient documentation

## 2019-07-07 NOTE — Patient Instructions (Addendum)
Info on DVT to be aware given your new Dx of blood clotting disorder  [email or web address]  ___  Lengthen Back rib by _ shoulder    Lie on   side , pillow between knees  Pull  arm overhead over mattress, grab the edge of mattress,pull it upward, drawing elbow away from ears  Breathing   Open book (handout)  Lying on  _ side , rotating  __ only this week    ___  Reposition back to lower ribs down first then pelvis to length back before starting:  Deep core level 1 and 2 ( handout)   ____ At work:  [employer] to lengthed side ribs  Soft knees   At home :  pillow under flank to length if lying on couch on your side   __  Notice standing with more even weight on ballmounds not just outside Avoid flips flops Get sandals with strap around the heel

## 2019-07-07 NOTE — Therapy (Signed)
[place] [facility] MAIN [facility] SERVICES [address] [place], [place], [address] Phone: [phone]   Fax:  [phone]  Physical Therapy Treatment  Patient Details  Name: [name] MRN: [id number] Date of Birth: 03-01-1991 Referring Provider (PT): Dalbert Garnet   Encounter Date: 07/07/2019  PT End of Session - 07/07/19 1707    Visit Number  2    Number of Visits  10    Date for PT Re-Evaluation  09/01/19   eval 06/23/19   PT Start Time  1703    PT Stop Time  1757    PT Time Calculation (min)  54 min       Past Medical History:  Diagnosis Date  . Asthma   . PCOS (polycystic ovarian syndrome)   . UTI (urinary tract infection)     Past Surgical History:  Procedure Laterality Date  . BREAST ENHANCEMENT SURGERY  2012  . SHOULDER SURGERY    . TONSILLECTOMY      There were no vitals filed for this visit.  Subjective Assessment - 07/07/19 1709    Subjective  Pt reported she has been catching herself not crossing her legs. Pt has noticed 0/10 pain in the groin and the LBP has been there but it is not at the level it was. Pt is no longer on contraceptive because she found out she has a blood disorder Factor II which causes blood clots. Pt's constipation has been better too.    Pertinent History  Two years ago, pt had a UTI that pt was hospitalized for because there was pus, blood. Pt recovered it after 7 days. Since menstrual cycles started at 29 years old, pt has had terrible cramps, severe clotting, headaches, irritability. Periods would occur irregularly. At one point, period did not occur for 6 months. Pt was on Yaz birth control pill but she has recently been off of it due to mother having a clotting gene.  Pt reports that at the time when she travelled on the cruise, pt un intentionally stopped taking her birth control pills with 1.5 weeks of pills left in her pack because she left it on the ship. Her period came 3 days after the last time she took  her pill. During this menstrual period is when she ended in the ED with the period like pelvic pain.  Surgeries: breast augmentation 2012, bicep muscle repair for rupture on R 2013, tonsilectomy    Patient Stated Goals  less pelvic pain , less cramping, and more core strength         [facility] PT Assessment - 07/07/19 1713      Observation/Other Assessments   Scoliosis  no spinal deviations       Posture/Postural Control   Posture Comments  knee adducted, supination, medial collapse of feet  L > R , hyperextended knees B       Strength   Overall Strength Comments  L hip abd 5/5, R 3+/5, hip ext B 3+/5       Palpation   SI assessment   levelled iliac crest , no spinal deviations                    [facility] Adult PT Treatment/Exercise - 07/07/19 1827      Neuro Re-ed    Neuro Re-ed Details   cued for diaphramgatic/ pelvic excursion, deep core , and standing posture with feet co-activation , recommended replacing flip flops with sandals with heel straps  Manual Therapy   Manual therapy comments  L intercostal mm STM/MWM, medial scap mm L                   PT Long Term Goals - 06/23/19 1736      PT LONG TERM GOAL #1   Title  Pt will report decreased pelvic pain epsiodes from 2 weeks/ month to < 1 week / month in order to increased QOL    Time  8    Period  Weeks    Status  New    Target Date  08/18/19      PT LONG TERM GOAL #2   Title  Pt will report decreased abdominal menstrual related cramping from 8/10 to < 4/10 in order demo self-management    Time  4    Period  Weeks    Status  New    Target Date  07/21/19      PT LONG TERM GOAL #3   Title  Pt's strength from increase from  hip abd 3/5 B, hip ext 2+/5 L, 3/5 R with lumbar lordosis to hip abd/ ext 4+/5 B  in order minimize pain and walk for aerobic exercise    Time  8    Period  Weeks    Status  New    Target Date  08/19/19      PT LONG TERM GOAL #4   Title  Pt will demo equal iliac crest  and no lumbar convex curve across 2 sessions in order to progress to deep core strengthening exercises    Time  2    Period  Weeks    Status  New    Target Date  07/22/19      PT LONG TERM GOAL #5   Title  Pt will demo proper body mechanics to maintain SIJ / spinal alignment and to minimize straining pelvic floor in order to minimize relapse of Sx    Time  6    Period  Weeks    Status  New    Target Date  08/05/19      Additional Long Term Goals   Additional Long Term Goals  Yes      PT LONG TERM GOAL #6   Title  Pt will report increased bowel regularity from every 3 days to daily bowel movements in order to promote GI health    Time  5    Period  Weeks    Status  New    Target Date  07/29/19      PT LONG TERM GOAL #7   Title  Pt will demo increased score on FOTO with Urinary function 71 pts to > 90 pt, Constipation 55 pts to > 70 pts, and decreased score with Pain from 38 pts to < 28 pts in order to demo improved function and improve QOL    Time  10    Period  Weeks    Status  New    Target Date  09/02/19      PT LONG TERM GOAL #8   Title  Pt will report no pain with sexual intercourse in order to improve QOL    Time  10    Period  Weeks    Status  New    Target Date  09/02/19            Plan - 07/07/19 1708    Clinical Impression Statement  Pt demo'd good carry over with equal alignment  of pelvic girdle and no more curves in spine after last session. Pt reports R groin pain has resolved and LBP has decreased.   Manual Tx to focused on optimizing diaphragmatic/ pelvic excursion to progress to deep core coordination and strengtehning. Post Tx, pt demo'd increased intercostals mobility/ L medial scapular mm mobility. Educated with cues in standing posture with feet co-activation, less hyperextension of knees and recommended replacing flip flops with sandals with heel straps. Regional interdependent approach will help yield longer lasting changes to minimizing pelvic  floor/ spinal dysfunctions.  Pt continues to benefit from skilled PT     Examination-Activity Limitations  Continence;Other;Hygiene/Grooming    Stability/Clinical Decision Making  Evolving/Moderate complexity    Rehab Potential  Good    PT Frequency  1x / week    PT Duration  Other (comment)   10   PT Treatment/Interventions  Therapeutic activities;Functional mobility training;Moist Heat;Gait training;Therapeutic exercise;Balance training;Patient/family education;Neuromuscular re-education;Manual techniques;Manual lymph drainage;Scar mobilization    Consulted and Agree with Plan of Care  Patient       Patient will benefit from skilled therapeutic intervention in order to improve the following deficits and impairments:  Decreased activity tolerance, Decreased coordination, Decreased knowledge of use of DME, Decreased safety awareness, Increased fascial restricitons, Impaired flexibility, Postural dysfunction, Improper body mechanics, Decreased range of motion, Decreased knowledge of precautions, Decreased scar mobility, Decreased endurance, Decreased balance, Decreased skin integrity, Decreased mobility, Hypomobility, Increased muscle spasms, Impaired sensation  Visit Diagnosis: Other lack of coordination  Other muscle spasm  Other symptoms and signs involving the musculoskeletal system  Sacrococcygeal disorders, not elsewhere classified     Problem List Patient Active Problem List   Diagnosis Date Noted  . Prothrombin G20210A mutation ([facility]) 07/05/2019  . Anxiety and depression 12/30/2017  . PCOS (polycystic ovarian syndrome) 12/30/2017  . Insomnia 12/30/2017  . Rash 09/18/2016  . History of UTI 06/17/2016  . Heel pain 01/15/2016    Mariane Masters ,PT, DPT, E-RYT   07/07/2019, 6:30 PM  [place] [facility] MAIN [facility] SERVICES [address] [place], [place], [address] Phone: [phone]   Fax:  [phone]  Name: [name] MRN:  [id number] Date of Birth: 11-May-1990

## 2019-07-08 ENCOUNTER — Telehealth: Payer: Self-pay | Admitting: Family

## 2019-07-08 NOTE — Telephone Encounter (Signed)
Faxed to [facility].

## 2019-07-08 NOTE — Telephone Encounter (Signed)
Fax alpha gal and celiac panel in your chair results to [place] at [facility]   thanks

## 2019-07-11 ENCOUNTER — Other Ambulatory Visit: Payer: Self-pay

## 2019-07-11 ENCOUNTER — Other Ambulatory Visit (INDEPENDENT_AMBULATORY_CARE_PROVIDER_SITE_OTHER): Payer: Managed Care, Other (non HMO)

## 2019-07-11 DIAGNOSIS — D6852 Prothrombin gene mutation: Secondary | ICD-10-CM | POA: Diagnosis not present

## 2019-07-11 NOTE — Addendum Note (Signed)
Addended by: Bonnell Public I on: 07/11/2019 03:26 PM   Modules accepted: Orders

## 2019-07-13 LAB — VON WILLEBRAND PANEL
Factor VIII Activity: 69 % (ref 56–140)
Von Willebrand Ag: 64 % (ref 50–200)
Von Willebrand Factor: 49 % — ABNORMAL LOW (ref 50–200)

## 2019-07-13 LAB — COAG STUDIES INTERP REPORT

## 2019-07-14 ENCOUNTER — Ambulatory Visit: Payer: Managed Care, Other (non HMO) | Admitting: Physical Therapy

## 2019-07-14 ENCOUNTER — Other Ambulatory Visit: Payer: Self-pay

## 2019-07-14 DIAGNOSIS — M62838 Other muscle spasm: Secondary | ICD-10-CM

## 2019-07-14 DIAGNOSIS — M4125 Other idiopathic scoliosis, thoracolumbar region: Secondary | ICD-10-CM

## 2019-07-14 DIAGNOSIS — R278 Other lack of coordination: Secondary | ICD-10-CM | POA: Diagnosis not present

## 2019-07-14 DIAGNOSIS — M533 Sacrococcygeal disorders, not elsewhere classified: Secondary | ICD-10-CM

## 2019-07-14 DIAGNOSIS — R29898 Other symptoms and signs involving the musculoskeletal system: Secondary | ICD-10-CM

## 2019-07-14 NOTE — Patient Instructions (Addendum)
Prone Heel Press for strengthening sacro-iliac joints  1. Lie on your belly. If you have an arch in your low back or it feels umcomfortable, place a pillow under your low belly/hips to make sure your low back feel comfortable.   2. Place our forehead on top of your palms.      Widen your knees apart for starting position.   3. Inhale, feel belly and low back expand  4. Exhale, feel belly hug in, press heel together and count aloud for 5 sec. Then relax the heel squeezing.  Perform 10 reps of 5 sec holds. 2 sets/ day.    If you feel entire buttock tighten too much or feel low back pain, apply 50% less effort. As you press your heel together, you will feel as if your pubic bone (front of your pelvis) and sacrum (back of your pelvis) gentle move towards each other or your low abdominal muscles hug in more.           childs poses rocking with pillow under belly if need more support/ relaxation  childs poses rocking - knee widers  Toes tucked, shoulders down and back, on forearms , hands shoulder width apart  10 reps   ___    PLEATS: BALLERINA  Heel raises in ballerina position, pressing rolled towel between heels  Hands at a corner of a wall   Knees bent pointed out like a "v" , navel ( center of mass) more forward  Heels together off the floor  Hinge at the hips like squat, length the low back  Don't curve the back, Hands on the doorway   20 reps  ________

## 2019-07-14 NOTE — Therapy (Signed)
[place] MAIN [facility] SERVICES [address] [place], [place], [address] Phone: [phone]   Fax:  [phone]  Physical Therapy Treatment  Patient Details  Name: [name] MRN: [id number] Date of Birth: May 07, 1990 Referring Provider (PT): Leafy Ro   Encounter Date: 07/14/2019  PT End of Session - 07/14/19 1726    Visit Number  3    Number of Visits  10    Date for PT Re-Evaluation  09/01/19   eval 06/23/19   PT Start Time  9381    PT Stop Time  1805    PT Time Calculation (min)  55 min       Past Medical History:  Diagnosis Date  . Asthma   . PCOS (polycystic ovarian syndrome)   . UTI (urinary tract infection)     Past Surgical History:  Procedure Laterality Date  . BREAST ENHANCEMENT SURGERY  2012  . SHOULDER SURGERY    . TONSILLECTOMY      There were no vitals filed for this visit.  Subjective Assessment - 07/14/19 1717    Subjective  Pt started her period today and she noticed it was unusual to not feel LBP, cramping.  Pt feels that is defineitely good.  Pt felt a little of the R groin pain with the stomach issues. Pt also felt it in the tailbone area. The stomach issue calmed down. The groin and tailbone pain also decreased.    Pertinent History  Two years ago, pt had a UTI that pt was hospitalized for because there was pus, blood. Pt recovered it after 7 days. Since menstrual cycles started at 29 years old, pt has had terrible cramps, severe clotting, headaches, irritability. Periods would occur irregularly. At one point, period did not occur for 6 months. Pt was on Yaz birth control pill but she has recently been off of it due to mother having a clotting gene.  Pt reports that at the time when she travelled on the cruise, pt un intentionally stopped taking her birth control pills with 1.5 weeks of pills left in her pack because she left it on the ship. Her period came 3 days after the last time she took her pill. During this  menstrual period is when she ended in the ED with the period like pelvic pain.  Surgeries: breast augmentation 2012, bicep muscle repair for rupture on R 2013, tonsilectomy    Patient Stated Goals  less pelvic pain , less cramping, and more core strength         [facility] PT Assessment - 07/14/19 1720      Strength   Overall Strength Comments  L hip abd 5/5, R 5/5, hip ext B 4-/5       Palpation   Spinal mobility  no  pain with  L lumbar sideflexion     SI assessment   levelled iliac crest , no spinal deviations     Palpation comment  flinching tenderness at L lateral coccyx, hypomobility at L lateral SIJ, tightness/ tenderness at L glut med/minim/ max by iliac crest/ GTrochanter L                    [facility] Adult PT Treatment/Exercise - 07/14/19 1755      Neuro Re-ed    Neuro Re-ed Details   cued for 2 SIJ stability exercises, cued for less lumbbar lordosis in multiple positions during session        Modalities   Modalities  Moist Heat      Moist Heat Therapy   Number Minutes Moist Heat  5 Minutes    Moist Heat Location  --   sacrum/ L hip: during new HEP     Manual Therapy   Manual therapy comments  L Base of sacrum, posterior border of GT and iliac crest posterior  / STM/MWM to promote hip abd/ ER                     PT Long Term Goals - 06/23/19 1736      PT LONG TERM GOAL #1   Title  Pt will report decreased pelvic pain epsiodes from 2 weeks/ month to < 1 week / month in order to increased QOL    Time  8    Period  Weeks    Status  New    Target Date  08/18/19      PT LONG TERM GOAL #2   Title  Pt will report decreased abdominal menstrual related cramping from 31/10 to < 4/10 in order demo self-management    Time  4    Period  Weeks    Status  New    Target Date  07/21/19      PT LONG TERM GOAL #3   Title  Pt's strength from increase from  hip abd 31/5 B, hip ext 2+/5 L, 3/5 R with lumbar lordosis to hip abd/ ext 4+/5 B  in order minimize pain  and walk for aerobic exercise    Time  8    Period  Weeks    Status  New    Target Date  08/19/19      PT LONG TERM GOAL #4   Title  Pt will demo equal iliac crest and no lumbar convex curve across 2 sessions in order to progress to deep core strengthening exercises    Time  2    Period  Weeks    Status  New    Target Date  07/22/19      PT LONG TERM GOAL #5   Title  Pt will demo proper body mechanics to maintain SIJ / spinal alignment and to minimize straining pelvic floor in order to minimize relapse of Sx    Time  6    Period  Weeks    Status  New    Target Date  08/05/19      Additional Long Term Goals   Additional Long Term Goals  Yes      PT LONG TERM GOAL #6   Title  Pt will report increased bowel regularity from every 3 days to daily bowel movements in order to promote GI health    Time  5    Period  Weeks    Status  New    Target Date  07/29/19      PT LONG TERM GOAL #7   Title  Pt will demo increased score on FOTO with Urinary function 31 pts to > 90 pt, Constipation 31 pts to > 70 pts, and decreased score with Pain from 31 pts to < 28 pts in order to demo improved function and improve QOL    Time  10    Period  Weeks    Status  New    Target Date  09/02/19      PT LONG TERM GOAL #8   Title  Pt will report no pain with sexual intercourse in order to improve QOL  Time  10    Period  Weeks    Status  New    Target Date  09/02/19            Plan - 07/14/19 1804    Clinical Impression Statement Pt is making great progress as pt reports no abdominal cramping/ LBP with her menstrual cycle this month.  Pt  has maintained equal alignment of pelvic girdle but pt required more manual Tx to promote more nutation of sacrum at L SIJ and iliac crest abduction/ ER. Following Tx, pt demo'd increased hip ext and abduction strength.  Pt required cues for less lumbar lordosis in various positions. Progressed HEP which helps to promote SIJ stability, lower kinetic chain to  promote hip abduction/ER , less genu valgus, and more co-activation of transverse arches.  Pt continues to benefit from skilled PT.    Examination-Activity Limitations  Continence;Other;Hygiene/Grooming    Stability/Clinical Decision Making  Evolving/Moderate complexity    Rehab Potential  Good    PT Frequency  1x / week    PT Duration  Other (comment)   10   PT Treatment/Interventions  Therapeutic activities;Functional mobility training;Moist Heat;Gait training;Therapeutic exercise;Balance training;Patient/family education;Neuromuscular re-education;Manual techniques;Manual lymph drainage;Scar mobilization    Consulted and Agree with Plan of Care  Patient       Patient will benefit from skilled therapeutic intervention in order to improve the following deficits and impairments:  Decreased activity tolerance, Decreased coordination, Decreased knowledge of use of DME, Decreased safety awareness, Increased fascial restricitons, Impaired flexibility, Postural dysfunction, Improper body mechanics, Decreased range of motion, Decreased knowledge of precautions, Decreased scar mobility, Decreased endurance, Decreased balance, Decreased skin integrity, Decreased mobility, Hypomobility, Increased muscle spasms, Impaired sensation  Visit Diagnosis: Other lack of coordination  Other muscle spasm  Other symptoms and signs involving the musculoskeletal system  Sacrococcygeal disorders, not elsewhere classified  Other idiopathic scoliosis, thoracolumbar region     Problem List Patient Active Problem List   Diagnosis Date Noted  . Prothrombin G20210A mutation (H[facility]) 07/05/2019  . Anxiety and depression 12/30/2017  . PCOS (polycystic ovarian syndrome) 12/30/2017  . Insomnia 12/30/2017  . Rash 09/18/2016  . History of UTI 06/17/2016  . Heel pain 01/15/2016    Mariane Masters ,PT, DPT, E-RYT  07/14/2019, 6:21 PM  [place] [facility] MAIN [facility] SERVICES [address] [place], [place], [address] Phone: [phone]   Fax:  [phone]  Name: [name] MRN: [id number] Date of Birth: Apr 29, 1990

## 2019-07-18 ENCOUNTER — Encounter: Payer: Self-pay | Admitting: Family

## 2019-07-18 ENCOUNTER — Ambulatory Visit: Payer: Managed Care, Other (non HMO) | Admitting: Family

## 2019-07-21 ENCOUNTER — Ambulatory Visit: Payer: Managed Care, Other (non HMO) | Admitting: Physical Therapy

## 2019-07-21 ENCOUNTER — Other Ambulatory Visit: Payer: Self-pay

## 2019-07-21 DIAGNOSIS — M4125 Other idiopathic scoliosis, thoracolumbar region: Secondary | ICD-10-CM

## 2019-07-21 DIAGNOSIS — R278 Other lack of coordination: Secondary | ICD-10-CM | POA: Diagnosis not present

## 2019-07-21 DIAGNOSIS — M62838 Other muscle spasm: Secondary | ICD-10-CM

## 2019-07-21 DIAGNOSIS — R29898 Other symptoms and signs involving the musculoskeletal system: Secondary | ICD-10-CM

## 2019-07-21 DIAGNOSIS — M533 Sacrococcygeal disorders, not elsewhere classified: Secondary | ICD-10-CM

## 2019-07-22 NOTE — Therapy (Signed)
[place] [facility] MAIN [facility] SERVICES [address] [place], [place], [address] Phone: [phone]   Fax:  [phone]  Physical Therapy Treatment  Patient Details  Name: [name] MRN: [id number] Date of Birth: September 26, 1990 Referring Provider (PT): Dalbert Garnet   Encounter Date: 07/21/2019  PT End of Session - 07/22/19 0843    Visit Number  4    Number of Visits  10    Date for PT Re-Evaluation  09/01/19   eval 06/23/19   PT Start Time  1700    PT Stop Time  1802    PT Time Calculation (min)  62 min       Past Medical History:  Diagnosis Date  . Asthma   . PCOS (polycystic ovarian syndrome)   . UTI (urinary tract infection)     Past Surgical History:  Procedure Laterality Date  . BREAST ENHANCEMENT SURGERY  2012  . SHOULDER SURGERY    . TONSILLECTOMY      There were no vitals filed for this visit.  Subjective Assessment - 07/22/19 0831    Subjective  Pt reported she had a long commute from [place] today and has to go back tomorrow morning.    Pertinent History  Two years ago, pt had a UTI that pt was hospitalized for because there was pus, blood. Pt recovered it after 7 days. Since menstrual cycles started at 29 years old, pt has had terrible cramps, severe clotting, headaches, irritability. Periods would occur irregularly. At one point, period did not occur for 6 months. Pt was on Yaz birth control pill but she has recently been off of it due to mother having a clotting gene.  Pt reports that at the time when she travelled on the cruise, pt un intentionally stopped taking her birth control pills with 1.5 weeks of pills left in her pack because she left it on the ship. Her period came 3 days after the last time she took her pill. During this menstrual period is when she ended in the ED with the period like pelvic pain.  Surgeries: breast augmentation 2012, bicep muscle repair for rupture on R 2013, tonsilectomy    Patient Stated Goals  less  pelvic pain , less cramping, and more core strength         [facility] PT Assessment - 07/22/19 0626      Other:   Other/ Comments  poor knee/ feet alignment in yoga poses ( warrior/ extended side angle), excessive lordosis of lumbar ( camel pose), hyperextension of knees in most poses ( standing and on the floor), toe flexion in standing poses                    [facility] Adult PT Treatment/Exercise - 07/22/19 0836      Therapeutic Activites    Therapeutic Activities  Other Therapeutic Activities    Other Therapeutic Activities  explained modifications to alignment to yoga poses and selection of specific poses to stretch adductors, promote pelvic stability , and rationale for not selecting typical yoga poses/ flow yoga style       Neuro Re-ed    Neuro Re-ed Details   cued for proper alignment and technique for standing and on the floor yoga poses to promote less genu valgus, medial arch collapse of feet, toe flexion                   PT Long Term Goals - 06/23/19 1736  PT LONG TERM GOAL #1   Title  Pt will report decreased pelvic pain epsiodes from 2 weeks/ month to < 1 week / month in order to increased QOL    Time  8    Period  Weeks    Status  New    Target Date  08/18/19      PT LONG TERM GOAL #2   Title  Pt will report decreased abdominal menstrual related cramping from 8/10 to < 4/10 in order demo self-management    Time  4    Period  Weeks    Status  New    Target Date  07/21/19      PT LONG TERM GOAL #3   Title  Pt's strength from increase from  hip abd 3/5 B, hip ext 2+/5 L, 3/5 R with lumbar lordosis to hip abd/ ext 4+/5 B  in order minimize pain and walk for aerobic exercise    Time  8    Period  Weeks    Status  New    Target Date  08/19/19      PT LONG TERM GOAL #4   Title  Pt will demo equal iliac crest and no lumbar convex curve across 2 sessions in order to progress to deep core strengthening exercises    Time  2    Period  Weeks     Status  New    Target Date  07/22/19      PT LONG TERM GOAL #5   Title  Pt will demo proper body mechanics to maintain SIJ / spinal alignment and to minimize straining pelvic floor in order to minimize relapse of Sx    Time  6    Period  Weeks    Status  New    Target Date  08/05/19      Additional Long Term Goals   Additional Long Term Goals  Yes      PT LONG TERM GOAL #6   Title  Pt will report increased bowel regularity from every 3 days to daily bowel movements in order to promote GI health    Time  5    Period  Weeks    Status  New    Target Date  07/29/19      PT LONG TERM GOAL #7   Title  Pt will demo increased score on FOTO with Urinary function 71 pts to > 90 pt, Constipation 55 pts to > 70 pts, and decreased score with Pain from 38 pts to < 28 pts in order to demo improved function and improve QOL    Time  10    Period  Weeks    Status  New    Target Date  09/02/19      PT LONG TERM GOAL #8   Title  Pt will report no pain with sexual intercourse in order to improve QOL    Time  10    Period  Weeks    Status  New    Target Date  09/02/19            Plan - 07/22/19 0843    Clinical Impression Statement Provided pt stretches to perform after long commutes which are part of her job. These stretches were yoga poses because pt enjoys practicing yoga and specific poses where selected to promote  alignment to yoga poses and selection of specific poses to stretch adductors, promote pelvic stability. Educated pt on rationale for not selecting typical  yoga poses/ flow yoga style in order to accommodate for her previous presentations with pelvic girdle misalignment and hypomobility and areas of tight muscles. Excessive cues were needed to help pt gain propioception awareness to  promote less genu valgus, medial arch collapse of feet, toe flexion.     Examination-Activity Limitations  Continence;Other;Hygiene/Grooming    Stability/Clinical Decision Making  Evolving/Moderate  complexity    Rehab Potential  Good    PT Frequency  1x / week    PT Duration  Other (comment)   10   PT Treatment/Interventions  Therapeutic activities;Functional mobility training;Moist Heat;Gait training;Therapeutic exercise;Balance training;Patient/family education;Neuromuscular re-education;Manual techniques;Manual lymph drainage;Scar mobilization    Consulted and Agree with Plan of Care  Patient       Patient will benefit from skilled therapeutic intervention in order to improve the following deficits and impairments:  Decreased activity tolerance, Decreased coordination, Decreased knowledge of use of DME, Decreased safety awareness, Increased fascial restricitons, Impaired flexibility, Postural dysfunction, Improper body mechanics, Decreased range of motion, Decreased knowledge of precautions, Decreased scar mobility, Decreased endurance, Decreased balance, Decreased skin integrity, Decreased mobility, Hypomobility, Increased muscle spasms, Impaired sensation  Visit Diagnosis: Other lack of coordination  Other muscle spasm  Other symptoms and signs involving the musculoskeletal system  Sacrococcygeal disorders, not elsewhere classified  Other idiopathic scoliosis, thoracolumbar region     Problem List Patient Active Problem List   Diagnosis Date Noted  . Prothrombin G20210A mutation ([place]) 07/05/2019  . Anxiety and depression 12/30/2017  . PCOS (polycystic ovarian syndrome) 12/30/2017  . Insomnia 12/30/2017  . Rash 09/18/2016  . History of UTI 06/17/2016  . Heel pain 01/15/2016    Jerl Mina ,PT, DPT, E-RYT  07/22/2019, 8:44 AM  [place] MAIN [facility] SERVICES [address] [place], [place], [address] Phone: [phone]   Fax:  [phone]  Name: MYRISSA CHIPLEY MRN: [id number] Date of Birth: Mar 13, 1991

## 2019-07-22 NOTE — Patient Instructions (Signed)
Gate pose:  Start in half kneeling Hips are squared to the front, knees hip width apart, back toes tucked under Sidebend like a wall is behind you x 2 Sidebend like a wall is behind and then five high the sky (trunk rotation) x 2  Hip flexion stretch with yoga blocks        eagle pose to release back of hips  Sit in a chair position, feet together  Cross R thigh over L, press them strongly against each other  Cross R arm over L arm, palms clasp  Stay and breath 3-5 breaths  Sitting a little deeper       Triangle Pose : Remember to lengthen spine, not shift hips, use block for lower hand    Extended side angle :  Feet are hip width apart, L foot one behind like you are on ski tracks,  R knee bent over ankle but not more forward then the ankle.  Make sure 50% weight is in the front foot/leg , 50% weight is the back foot/ leg    Rest R forearm lightly on top of thigh,  L hand on L hip.  Inhale lengthen spine,   Exhale turn navel to the L then the ribcage turns, look at the other wall.  Keep maintaining  50% weight is in the front foot/leg , 50% weight is the back foot/ leg  And make sure the front knee is still pointed in the toe line of the 2nd toe.   3 breaths here.     Warrior III:    Both Web designer.  Unlock knee of standing leg Foot pointed perpendicular to ground og leg in the air, hips squared to floor, TO AVOID LOW BACK PAIN, LIFT LEG AT A  LESS ANGLE FROM THE FLOOR and KEEP GAZE DOWN TO NOT PUFF OUT THE CHEST    3 breaths here.      Goddess pose  Chair pose with twist

## 2019-07-28 ENCOUNTER — Ambulatory Visit: Payer: Managed Care, Other (non HMO) | Attending: Obstetrics and Gynecology | Admitting: Physical Therapy

## 2019-07-28 DIAGNOSIS — M62838 Other muscle spasm: Secondary | ICD-10-CM | POA: Insufficient documentation

## 2019-07-28 DIAGNOSIS — R29898 Other symptoms and signs involving the musculoskeletal system: Secondary | ICD-10-CM | POA: Insufficient documentation

## 2019-07-28 DIAGNOSIS — R278 Other lack of coordination: Secondary | ICD-10-CM | POA: Insufficient documentation

## 2019-07-28 DIAGNOSIS — M25572 Pain in left ankle and joints of left foot: Secondary | ICD-10-CM | POA: Insufficient documentation

## 2019-07-28 DIAGNOSIS — M4125 Other idiopathic scoliosis, thoracolumbar region: Secondary | ICD-10-CM | POA: Insufficient documentation

## 2019-07-28 DIAGNOSIS — M533 Sacrococcygeal disorders, not elsewhere classified: Secondary | ICD-10-CM | POA: Diagnosis present

## 2019-07-28 NOTE — Patient Instructions (Signed)
   Side of hip stretch:  Reclined twist for hips and side of the hips/ legs  Lay on your back, knees bend Scoot hips to the L , leave shoulders in place Drop knees to the R side resting onto pillows to keep leg at the same width of hips Pillow under R thigh to minimize too much strain   ___  Bridging series w/ resistive band other side of doorknob:  Level 1:  Position:  Elbows bent, knees hip width apart, heels under knees on top of stable  foot stool   Stabilization points: shoulders, upper arms, back of head pressed into floor. Heel press downward.   Movement: inhale do nothing, exhale pull band by side, lower fists to floor completely while lifting hips.Keep stabilization points engaged when you allow the band to go back to starting position  10 x 2 reps       Level 2:  Position:  Elbows straight, arms raised to ceiling at shoulder height, knees apart like a ballerina,heels together, heels under knees, on top of stable  foot stool   Stabilization points: shoulders, upper arms, back of head pressed into floor. Heel press downward.   Movement: inhale do nothing, exhale pull band by side, lower fists to floor completely while lifting hips. Keep stabilization points engaged when you allow the band to go back to starting position   10 x 2 reps  Shoulder training: Try to imagine you are squeezing a pencil under your armpit and your shoulder blades are down away from your ears and towards each other

## 2019-07-29 NOTE — Therapy (Signed)
[place] MAIN [facility] SERVICES [address] [place], [place], [address] Phone: [phone]   Fax:  [phone]  Physical Therapy Treatment  Patient Details  Name: [name] MRN: [id number] Date of Birth: [birth year] Referring Provider (PT): Leafy Ro   Encounter Date: 07/28/2019  PT End of Session - 07/29/19 0115    Visit Number  6    Number of Visits  10    Date for PT Re-Evaluation  09/01/19   eval 06/23/19   PT Start Time  1703    PT Stop Time  1800    PT Time Calculation (min)  57 min    Activity Tolerance  Patient tolerated treatment well       Past Medical History:  Diagnosis Date  . Asthma   . PCOS (polycystic ovarian syndrome)   . UTI (urinary tract infection)     Past Surgical History:  Procedure Laterality Date  . BREAST ENHANCEMENT SURGERY  2012  . SHOULDER SURGERY    . TONSILLECTOMY      There were no vitals filed for this visit.  Subjective Assessment - 07/29/19 0112    Subjective  Pt noticed when she had stomach issues this week, her pelvic pain / low back came back but as soon as stomach issues resolved, the pelvic pain/ low back  did as well.      Pertinent History  Two years ago, pt had a UTI that pt was hospitalized for because there was pus, blood. Pt recovered it after 7 days. Since menstrual cycles started at 29 years old, pt has had terrible cramps, severe clotting, headaches, irritability. Periods would occur irregularly. At one point, period did not occur for 6 months. Pt was on Yaz birth control pill but she has recently been off of it due to mother having a clotting gene.  Pt reports that at the time when she travelled on the cruise, pt un intentionally stopped taking her birth control pills with 1.5 weeks of pills left in her pack because she left it on the ship. Her period came 3 days after the last time she took her pill. During this menstrual period is when she ended in the ED with the period like pelvic  pain.  Surgeries: breast augmentation 2012, bicep muscle repair for rupture on R 2013, tonsilectomy    Patient Stated Goals  less pelvic pain , less cramping, and more core strength         [facility] PT Assessment - 07/29/19 0112      Palpation   Spinal mobility  L scapula more protracted     SI assessment   levelled iliac crest,     Palpation comment  tightness  intercostals T10 L , interspinals L        Limited lateral excursion of ribs   Increased lumbar lordosis, cued for rib depression             [facility] Adult PT Treatment/Exercise - 07/29/19 0112      Posture/Postural Control   Posture Comments  less knee adduction, no more hyperextension of knees       Neuro Re-ed    Neuro Re-ed Details   cued for thoracolumbar/ scapular stabilization HEP with bands  , cued for rib depression      Manual Therapy   Manual therapy comments  STM/MWM at intercostals T10 L , interspinals L  PT Long Term Goals - 06/23/19 1736      PT LONG TERM GOAL #1   Title  Pt will report decreased pelvic pain epsiodes from 2 weeks/ month to < 1 week / month in order to increased QOL    Time  8    Period  Weeks    Status  New    Target Date  08/18/19      PT LONG TERM GOAL #2   Title  Pt will report decreased abdominal menstrual related cramping from 8/10 to < 4/10 in order demo self-management    Time  4    Period  Weeks    Status  New    Target Date  07/21/19      PT LONG TERM GOAL #3   Title  Pt's strength from increase from  hip abd 3/5 B, hip ext 2+/5 L, 3/5 R with lumbar lordosis to hip abd/ ext 4+/5 B  in order minimize pain and walk for aerobic exercise    Time  8    Period  Weeks    Status  New    Target Date  08/19/19      PT LONG TERM GOAL #4   Title  Pt will demo equal iliac crest and no lumbar convex curve across 2 sessions in order to progress to deep core strengthening exercises    Time  2    Period  Weeks    Status  New    Target Date   07/22/19      PT LONG TERM GOAL #5   Title  Pt will demo proper body mechanics to maintain SIJ / spinal alignment and to minimize straining pelvic floor in order to minimize relapse of Sx    Time  6    Period  Weeks    Status  New    Target Date  08/05/19      Additional Long Term Goals   Additional Long Term Goals  Yes      PT LONG TERM GOAL #6   Title  Pt will report increased bowel regularity from every 3 days to daily bowel movements in order to promote GI health    Time  5    Period  Weeks    Status  New    Target Date  07/29/19      PT LONG TERM GOAL #7   Title  Pt will demo increased score on FOTO with Urinary function 71 pts to > 90 pt, Constipation 55 pts to > 70 pts, and decreased score with Pain from 38 pts to < 28 pts in order to demo improved function and improve QOL    Time  10    Period  Weeks    Status  New    Target Date  09/02/19      PT LONG TERM GOAL #8   Title  Pt will report no pain with sexual intercourse in order to improve QOL    Time  10    Period  Weeks    Status  New    Target Date  09/02/19            Plan - 07/29/19 0116    Clinical Impression Statement  Pt required manual Tx to decreased L interspinal/ intercostal T10 tightness and required neuromuscular re-education to minimize rib flaring/ lumbar lordosis and optimize lateral diaphragmatic excursion. These improvements will help with deep core strengthening. Added scapulothoracic stabilization strengthening with lower kinetic chain  strengthening today which pt required cues for technique. Pt continues to benefit from skilled PT.     Examination-Activity Limitations  Continence;Other;Hygiene/Grooming    Stability/Clinical Decision Making  Evolving/Moderate complexity    Rehab Potential  Good    PT Frequency  1x / week    PT Duration  Other (comment)   10   PT Treatment/Interventions  Therapeutic activities;Functional mobility training;Moist Heat;Gait training;Therapeutic exercise;Balance  training;Patient/family education;Neuromuscular re-education;Manual techniques;Manual lymph drainage;Scar mobilization    Consulted and Agree with Plan of Care  Patient       Patient will benefit from skilled therapeutic intervention in order to improve the following deficits and impairments:  Decreased activity tolerance, Decreased coordination, Decreased knowledge of use of DME, Decreased safety awareness, Increased fascial restricitons, Impaired flexibility, Postural dysfunction, Improper body mechanics, Decreased range of motion, Decreased knowledge of precautions, Decreased scar mobility, Decreased endurance, Decreased balance, Decreased skin integrity, Decreased mobility, Hypomobility, Increased muscle spasms, Impaired sensation  Visit Diagnosis: Other lack of coordination  Other muscle spasm  Other symptoms and signs involving the musculoskeletal system  Sacrococcygeal disorders, not elsewhere classified  Other idiopathic scoliosis, thoracolumbar region     Problem List Patient Active Problem List   Diagnosis Date Noted  . Prothrombin G20210A mutation (HCC) 07/05/2019  . Anxiety and depression 12/30/2017  . PCOS (polycystic ovarian syndrome) 12/30/2017  . Insomnia 12/30/2017  . Rash 09/18/2016  . History of UTI 06/17/2016  . Heel pain 01/15/2016    Mariane Masters ,PT, DPT, E-RYT  07/29/2019, 1:17 AM  [facility] MAIN [facility] SERVICES [address] [place], [place], [address] Phone: [phone]   Fax:  [phone]  Name: [name] MRN: [id number] Date of Birth: [birth year]

## 2019-08-04 ENCOUNTER — Other Ambulatory Visit: Payer: Self-pay

## 2019-08-04 ENCOUNTER — Ambulatory Visit: Payer: Managed Care, Other (non HMO) | Admitting: Physical Therapy

## 2019-08-04 DIAGNOSIS — M533 Sacrococcygeal disorders, not elsewhere classified: Secondary | ICD-10-CM

## 2019-08-04 DIAGNOSIS — M4125 Other idiopathic scoliosis, thoracolumbar region: Secondary | ICD-10-CM

## 2019-08-04 DIAGNOSIS — M62838 Other muscle spasm: Secondary | ICD-10-CM

## 2019-08-04 DIAGNOSIS — R278 Other lack of coordination: Secondary | ICD-10-CM | POA: Diagnosis not present

## 2019-08-04 DIAGNOSIS — R29898 Other symptoms and signs involving the musculoskeletal system: Secondary | ICD-10-CM

## 2019-08-04 NOTE — Therapy (Signed)
[place] MAIN [facility] SERVICES [address] [place], [place], [address] Phone: [phone]   Fax:  [phone]  Physical Therapy Treatment  Patient Details  Name: [name] MRN: [id number] Date of Birth: 1990/05/07 Referring Provider (PT): Leafy Ro   Encounter Date: 08/04/2019  PT End of Session - 08/04/19 1831    Visit Number  7    Number of Visits  10    Date for PT Re-Evaluation  09/01/19   eval 06/23/19   PT Start Time  1700    PT Stop Time  1800    PT Time Calculation (min)  60 min    Activity Tolerance  Patient tolerated treatment well       Past Medical History:  Diagnosis Date  . Asthma   . PCOS (polycystic ovarian syndrome)   . UTI (urinary tract infection)     Past Surgical History:  Procedure Laterality Date  . BREAST ENHANCEMENT SURGERY  2012  . SHOULDER SURGERY    . TONSILLECTOMY      There were no vitals filed for this visit.  Subjective Assessment - 08/04/19 1713    Subjective  Pt reported the midback felt so much better after last session and she was able to pressure washed her house with minor twinges. This week, she had constipation but she was able to get her bowels back on track. Pt is not sure if she ate something.    Pertinent History  Two years ago, pt had a UTI that pt was hospitalized for because there was pus, blood. Pt recovered it after 7 days. Since menstrual cycles started at 29 years old, pt has had terrible cramps, severe clotting, headaches, irritability. Periods would occur irregularly. At one point, period did not occur for 6 months. Pt was on Yaz birth control pill but she has recently been off of it due to mother having a clotting gene.  Pt reports that at the time when she travelled on the cruise, pt un intentionally stopped taking her birth control pills with 1.5 weeks of pills left in her pack because she left it on the ship. Her period came 3 days after the last time she took her pill. During  this menstrual period is when she ended in the ED with the period like pelvic pain.  Surgeries: breast augmentation 2012, bicep muscle repair for rupture on R 2013, tonsilectomy    Patient Stated Goals  less pelvic pain , less cramping, and more core strength         [facility] PT Assessment - 08/04/19 1830      Coordination   Gross Motor Movements are Fluid and Coordinated  --   limited diaphragmatic depression, cuing required     Palpation   Spinal mobility  no increased tightness B                    [facility] Adult PT Treatment/Exercise - 08/04/19 1820      Therapeutic Activites    Other Therapeutic Activities  explained the physiology of lymph system and increasing circulation      Neuro Re-ed    Neuro Re-ed Details   guided body scan        Manual Therapy   Manual therapy comments  gentle massage over abdomen to promote circulation/ relaxation / depression of diaphragm                   PT Long Term Goals - 08/04/19  Otho #1   Title  Pt will report decreased pelvic pain epsiodes from 2 weeks/ month to < 1 week / month in order to increased QOL    Time  8    Period  Weeks    Status  Partially Met      PT LONG TERM GOAL #2   Title  Pt will report decreased abdominal menstrual related cramping from 8/10 to < 4/10 in order demo self-management    Time  4    Period  Weeks    Status  Achieved      PT LONG TERM GOAL #3   Title  Pt's strength from increase from  hip abd 3/5 B, hip ext 2+/5 L, 3/5 R with lumbar lordosis to hip abd/ ext 4+/5 B  in order minimize pain and walk for aerobic exercise  ( 08/04/19:  3+/5 R hip abd/ 4-/5 hip abd, hip ext R 3+/5, L 4-/5 )    Time  8    Period  Weeks    Status  On-going      PT LONG TERM GOAL #4   Title  Pt will demo equal iliac crest and no lumbar convex curve across 2 sessions in order to progress to deep core strengthening exercises    Time  2    Period  Weeks    Status  Achieved      PT  LONG TERM GOAL #5   Title  Pt will demo proper body mechanics to maintain SIJ / spinal alignment and to minimize straining pelvic floor in order to minimize relapse of Sx    Time  6    Period  Weeks    Status  Achieved      PT LONG TERM GOAL #6   Title  Pt will report increased bowel regularity from every 3 days to daily bowel movements in order to promote GI health    Time  5    Period  Weeks    Status  Partially Met      PT LONG TERM GOAL #7   Title  Pt will demo increased score on FOTO with Urinary function 71 pts to > 90 pt, Constipation 55 pts to > 70 pts, and decreased score with Pain from 38 pts to < 28 pts in order to demo improved function and improve QOL  (08/04/19:  8 pts indicates better function for Pain)    Time  10    Period  Weeks    Status  Partially Met      PT LONG TERM GOAL #8   Title  Pt will report no pain with sexual intercourse in order to improve QOL    Time  10    Period  Weeks    Status  On-going            Plan - 08/04/19 1831    Clinical Impression Statement Pt demo'd no increased tightness of posterior back mm which showed good carry over from last session. Deep core strength is increasing as well. Provided gentle abdominal massage and education about lymph system care for maintenance and wellness around menstrual cycles and digestion.  Plan to advance with fitness routine next session and perform internal pelvic floor. Pt continues to require skilled PT to reach goals.    Examination-Activity Limitations  Continence;Other;Hygiene/Grooming    Stability/Clinical Decision Making  Evolving/Moderate complexity    Rehab Potential  Good  PT Frequency  1x / week    PT Duration  Other (comment)   10   PT Treatment/Interventions  Therapeutic activities;Functional mobility training;Moist Heat;Gait training;Therapeutic exercise;Balance training;Patient/family education;Neuromuscular re-education;Manual techniques;Manual lymph drainage;Scar mobilization     Consulted and Agree with Plan of Care  Patient       Patient will benefit from skilled therapeutic intervention in order to improve the following deficits and impairments:  Decreased activity tolerance, Decreased coordination, Decreased knowledge of use of DME, Decreased safety awareness, Increased fascial restricitons, Impaired flexibility, Postural dysfunction, Improper body mechanics, Decreased range of motion, Decreased knowledge of precautions, Decreased scar mobility, Decreased endurance, Decreased balance, Decreased skin integrity, Decreased mobility, Hypomobility, Increased muscle spasms, Impaired sensation  Visit Diagnosis: Other lack of coordination  Sacrococcygeal disorders, not elsewhere classified  Other muscle spasm  Other idiopathic scoliosis, thoracolumbar region  Other symptoms and signs involving the musculoskeletal system     Problem List Patient Active Problem List   Diagnosis Date Noted  . Prothrombin G20210A mutation ([place]) 07/05/2019  . Anxiety and depression 12/30/2017  . PCOS (polycystic ovarian syndrome) 12/30/2017  . Insomnia 12/30/2017  . Rash 09/18/2016  . History of UTI 06/17/2016  . Heel pain 01/15/2016    Jerl Mina ,PT, DPT, E-RYT  08/04/2019, 6:32 PM  [place] MAIN [facility] SERVICES [address] [place], [place], [address] Phone: [phone]   Fax:  [phone]  Name: YEIMY BRABANT MRN: [id number] Date of Birth: 11/07/1990

## 2019-08-04 NOTE — Patient Instructions (Signed)
Gentle abdominal massage (handout)   body scan

## 2019-08-11 ENCOUNTER — Other Ambulatory Visit: Payer: Self-pay

## 2019-08-11 ENCOUNTER — Ambulatory Visit: Payer: Managed Care, Other (non HMO) | Admitting: Physical Therapy

## 2019-08-11 DIAGNOSIS — M4125 Other idiopathic scoliosis, thoracolumbar region: Secondary | ICD-10-CM

## 2019-08-11 DIAGNOSIS — M62838 Other muscle spasm: Secondary | ICD-10-CM

## 2019-08-11 DIAGNOSIS — M533 Sacrococcygeal disorders, not elsewhere classified: Secondary | ICD-10-CM

## 2019-08-11 DIAGNOSIS — R278 Other lack of coordination: Secondary | ICD-10-CM

## 2019-08-11 DIAGNOSIS — R29898 Other symptoms and signs involving the musculoskeletal system: Secondary | ICD-10-CM

## 2019-08-11 DIAGNOSIS — M25572 Pain in left ankle and joints of left foot: Secondary | ICD-10-CM

## 2019-08-11 NOTE — Patient Instructions (Addendum)
   Opposite swimmer with locust pose arms ( hands by thighs)   -lift L leg up ~10 deg, R hand up slightly above body height)  Alternate  30 reps    __  Clamshells on L 30 reps    __  Stretches  Figure -4 stretch  Knee to chest     ___   Stretches for pelvic floor    On belly: Riding horse edge of mattress  knee bent like riding a horse, move knee towards armpit and out  10 reps   Mermaid stretch  Rocking while seated on the floor with heels to one side of the hip Heels to one side of the hip  Rock forward towards the knee that is bent , rock beck towards the opposite sitting bones

## 2019-08-12 NOTE — Therapy (Signed)
[place] MAIN [facility] SERVICES [address] [place], [place], [address] Phone: [phone]   Fax:  [phone]  Physical Therapy Treatment / Progress Note   Patient Details  Name: [name] MRN: [id number] Date of Birth: 1990/09/10 Referring Provider (PT): Leafy Ro   Encounter Date: 08/11/2019  PT End of Session - 08/11/19 1759    Visit Number  8    Number of Visits  10    Date for PT Re-Evaluation  09/01/19   eval 06/23/19   PT Start Time  4585    PT Stop Time  1759    PT Time Calculation (min)  55 min    Activity Tolerance  Patient tolerated treatment well       Past Medical History:  Diagnosis Date  . Asthma   . PCOS (polycystic ovarian syndrome)   . UTI (urinary tract infection)     Past Surgical History:  Procedure Laterality Date  . BREAST ENHANCEMENT SURGERY  2012  . SHOULDER SURGERY    . TONSILLECTOMY      There were no vitals filed for this visit.  Subjective Assessment - 08/11/19 1703    Subjective  Pt tripped, feel, and twisted L ankle when playing on the playground with her niece. Pt had to be carried off by her husband because it hurt so bad. Pt has not been able to do alot of exercise week with the ankle. The swelling of the ankle and the pain is better yesterday    Pertinent History  Two years ago, pt had a UTI that pt was hospitalized for because there was pus, blood. Pt recovered it after 7 days. Since menstrual cycles started at 29 years old, pt has had terrible cramps, severe clotting, headaches, irritability. Periods would occur irregularly. At one point, period did not occur for 6 months. Pt was on Yaz birth control pill but she has recently been off of it due to mother having a clotting gene.  Pt reports that at the time when she travelled on the cruise, pt un intentionally stopped taking her birth control pills with 1.5 weeks of pills left in her pack because she left it on the ship. Her period came 3 days  after the last time she took her pill. During this menstrual period is when she ended in the ED with the period like pelvic pain.  Surgeries: breast augmentation 2012, bicep muscle repair for rupture on R 2013, tonsilectomy    Patient Stated Goals  less pelvic pain , less cramping, and more core strength         [facility] PT Assessment - 08/12/19 1218      Palpation   Spinal mobility  less tightness of posterior back     SI assessment   limited FABD and FADDIR B , tightness at coccygeus, hip external rotators B   pelvic glirdle aligned                Pelvic Floor Special Questions - 08/12/19 1217    External Perineal Exam  tenderness and tightness at ischial tuberosity attachments B           [facility] Adult PT Treatment/Exercise - 08/12/19 1215      Exercises   Exercises  --   stretches for lengthening pelvic floor, see pt instructions     Moist Heat Therapy   Number Minutes Moist Heat  5 Minutes    Moist Heat Location  --   posterior pelvis  Manual Therapy   Manual therapy comments  external STM/MWM at ischial tubosity B                   PT Long Term Goals - 08/11/19 1707      PT LONG TERM GOAL #1   Title  Pt will report decreased pelvic pain epsiodes from 2 weeks/ month to < 1 week / month in order to increased QOL    Time  8    Period  Weeks    Status  Achieved      PT LONG TERM GOAL #2   Title  Pt will report decreased abdominal menstrual related cramping from 8/10 to < 4/10 in order demo self-management    Time  4    Period  Weeks    Status  Achieved      PT LONG TERM GOAL #3   Title  Pt's strength from increase from  hip abd 3/5 B, hip ext 2+/5 L, 3/5 R with lumbar lordosis to hip abd/ ext 4+/5 B  in order minimize pain and walk for aerobic exercise  ( 08/04/19:  3+/5 R hip abd/ 4-/5 hip abd, hip ext R 3+/5, L 4-/5 )    Time  8    Period  Weeks    Status  On-going      PT LONG TERM GOAL #4   Title  Pt will demo equal iliac crest and no  lumbar convex curve across 2 sessions in order to progress to deep core strengthening exercises    Time  2    Period  Weeks    Status  Achieved      PT LONG TERM GOAL #5   Title  Pt will demo proper body mechanics to maintain SIJ / spinal alignment and to minimize straining pelvic floor in order to minimize relapse of Sx    Time  6    Period  Weeks    Status  Achieved      PT LONG TERM GOAL #6   Title  Pt will report increased bowel regularity from every 3 days to daily bowel movements in order to promote GI health    Time  5    Period  Weeks    Status  Partially Met      PT LONG TERM GOAL #7   Title  Pt will demo increased score on FOTO with Urinary function 71 pts to > 90 pt, Constipation 55 pts to > 70 pts, and decreased score with Pain from 38 pts to < 28 pts in order to demo improved function and improve QOL  (08/04/19:  8 pts indicates better function for Pain)    Time  10    Period  Weeks    Status  Partially Met      PT LONG TERM GOAL #8   Title  Pt will report no pain with sexual intercourse in order to improve QOL    Time  10    Period  Weeks    Status  On-going            Plan - 08/11/19 1759    Clinical Impression Statement  Pt has achieved 5/8 goals and progressing well towards remaining goals. Pt had less cramping and pain with last menstrual cycle.   Pt's pelvic girdle and spine has improved with better alignment and posture.  Continuing to work on decreasing pelvic floor mm tightness and increase hip strength to improve bowel regularity,   decrease dysparenuia.   Pt had incurred a mild R ankle sprain last week and will benefit from PT and thus, adding this Dx to POC.    Pt continues to benefit from skilled PT      Examination-Activity Limitations  Continence;Other;Hygiene/Grooming    Stability/Clinical Decision Making  Evolving/Moderate complexity    Rehab Potential  Good    PT Frequency  1x / week    PT Duration  Other (comment)   10   PT  Treatment/Interventions  Therapeutic activities;Functional mobility training;Moist Heat;Gait training;Therapeutic exercise;Balance training;Patient/family education;Neuromuscular re-education;Manual techniques;Manual lymph drainage;Scar mobilization    Consulted and Agree with Plan of Care  Patient       Patient will benefit from skilled therapeutic intervention in order to improve the following deficits and impairments:  Decreased activity tolerance, Decreased coordination, Decreased knowledge of use of DME, Decreased safety awareness, Increased fascial restricitons, Impaired flexibility, Postural dysfunction, Improper body mechanics, Decreased range of motion, Decreased knowledge of precautions, Decreased scar mobility, Decreased endurance, Decreased balance, Decreased skin integrity, Decreased mobility, Hypomobility, Increased muscle spasms, Impaired sensation  Visit Diagnosis: Sacrococcygeal disorders, not elsewhere classified  Other lack of coordination  Other muscle spasm  Other idiopathic scoliosis, thoracolumbar region  Other symptoms and signs involving the musculoskeletal system  Acute left ankle pain     Problem List Patient Active Problem List   Diagnosis Date Noted  . Prothrombin G20210A mutation ([place]) 07/05/2019  . Anxiety and depression 12/30/2017  . PCOS (polycystic ovarian syndrome) 12/30/2017  . Insomnia 12/30/2017  . Rash 09/18/2016  . History of UTI 06/17/2016  . Heel pain 01/15/2016    Jerl Mina ,PT, DPT, E-RYT  08/12/2019, 12:21 PM  [place] MAIN [facility] SERVICES [address] [place], [place], [address] Phone: [phone]   Fax:  [phone]  Name: [name] MRN: [id number] Date of Birth: 1990-11-30

## 2019-08-18 ENCOUNTER — Other Ambulatory Visit: Payer: Self-pay

## 2019-08-18 ENCOUNTER — Ambulatory Visit: Payer: Managed Care, Other (non HMO) | Admitting: Physical Therapy

## 2019-08-18 DIAGNOSIS — R278 Other lack of coordination: Secondary | ICD-10-CM | POA: Diagnosis not present

## 2019-08-18 DIAGNOSIS — M25572 Pain in left ankle and joints of left foot: Secondary | ICD-10-CM

## 2019-08-18 DIAGNOSIS — M533 Sacrococcygeal disorders, not elsewhere classified: Secondary | ICD-10-CM

## 2019-08-18 DIAGNOSIS — M62838 Other muscle spasm: Secondary | ICD-10-CM

## 2019-08-18 DIAGNOSIS — R29898 Other symptoms and signs involving the musculoskeletal system: Secondary | ICD-10-CM

## 2019-08-18 DIAGNOSIS — M4125 Other idiopathic scoliosis, thoracolumbar region: Secondary | ICD-10-CM

## 2019-08-18 NOTE — Patient Instructions (Addendum)
Multifidis twist  Band is on doorknob: stand further away from door (facing perpendicular)   Twisting trunk without moving the hips and knees Hold band at the level of ribcage, elbows bent,shoulder blades roll back and down like squeezing a pencil under armpit    Exhale twist,.10-15 deg away from door without moving your hips/ knees. Continue to maintain equal weight through legs. Keep knee unlocked.  10 x 2    ____  minisquat side step  Hallway   2 lap   ___  Step up on R, then L , then lower R and then L [place]  Lean shoulders forward over shins to slow the descent  30 reps  Calf stretches after wards

## 2019-08-19 NOTE — Therapy (Signed)
[place] MAIN [facility] SERVICES [address] [place], [place], [address] Phone: [phone]   Fax:  [phone]  Physical Therapy Treatment  Patient Details  Name: [name] MRN: [id number] Date of Birth: 28-Jun-1990 Referring Provider (PT): Leafy Ro   Encounter Date: 08/18/2019  PT End of Session - 08/18/19 1717    Visit Number  9    Number of Visits  18    Date for PT Re-Evaluation  10/21/19   eval 06/23/19   PT Start Time  3016    PT Stop Time  1800    PT Time Calculation (min)  56 min    Activity Tolerance  Patient tolerated treatment well       Past Medical History:  Diagnosis Date  . Asthma   . PCOS (polycystic ovarian syndrome)   . UTI (urinary tract infection)     Past Surgical History:  Procedure Laterality Date  . BREAST ENHANCEMENT SURGERY  2012  . SHOULDER SURGERY    . TONSILLECTOMY      There were no vitals filed for this visit.  Subjective Assessment - 08/18/19 1707    Subjective  Pt noticed no more pre menstrual related Sx for this month's cycle. Pt still had cramping after it got heavier. Her energy dipped during this time.    Pertinent History  Two years ago, pt had a UTI that pt was hospitalized for because there was pus, blood. Pt recovered it after 7 days. Since menstrual cycles started at 29 years old, pt has had terrible cramps, severe clotting, headaches, irritability. Periods would occur irregularly. At one point, period did not occur for 6 months. Pt was on Yaz birth control pill but she has recently been off of it due to mother having a clotting gene.  Pt reports that at the time when she travelled on the cruise, pt un intentionally stopped taking her birth control pills with 1.5 weeks of pills left in her pack because she left it on the ship. Her period came 3 days after the last time she took her pill. During this menstrual period is when she ended in the ED with the period like pelvic pain.  Surgeries:  breast augmentation 2012, bicep muscle repair for rupture on R 2013, tonsilectomy    Patient Stated Goals  less pelvic pain , less cramping, and more core strength         [facility] PT Assessment - 08/19/19 0207      Squat   Comments  minor cues for knee behind toes      Other:   Other/ Comments    Step down with poor eccentric control      Strength   Overall Strength Comments  hip abd L 4+/5, R 4-/5 , hip ext 4-/5 B                    [facility] Adult PT Treatment/Exercise - 08/19/19 0206      Neuro Re-ed    Neuro Re-ed Details   cue for proper technique for new HEP to promote multifidis , hip abduction strength , step up/ down with eccentric control        Exercises   Exercises  --   see pt instructions                 PT Long Term Goals - 08/18/19 1715      PT LONG TERM GOAL #1   Title  Pt will  report decreased pelvic pain epsiodes from 2 weeks/ month to < 1 week / month in order to increased QOL    Time  8    Period  Weeks    Status  Achieved      PT LONG TERM GOAL #2   Title  Pt will report decreased abdominal menstrual related cramping from 8/10 to < 4/10 in order demo self-management    Time  4    Period  Weeks    Status  Achieved      PT LONG TERM GOAL #3   Title  Pt's strength from increase from  hip abd 3/5 B, hip ext 2+/5 L, 3/5 R with lumbar lordosis to hip abd/ ext 4+/5 B  in order minimize pain and walk for aerobic exercise  ( 08/04/19:  3+/5 R hip abd/ 4-/5 hip abd, hip ext R 3+/5, L 4-/5 )    Time  8    Period  Weeks    Status  On-going      PT LONG TERM GOAL #4   Title  Pt will demo equal iliac crest and no lumbar convex curve across 2 sessions in order to progress to deep core strengthening exercises    Time  2    Period  Weeks    Status  Achieved      PT LONG TERM GOAL #5   Title  Pt will demo proper body mechanics to maintain SIJ / spinal alignment and to minimize straining pelvic floor in order to minimize relapse of Sx    Time   6    Period  Weeks    Status  Achieved      PT LONG TERM GOAL #6   Title  Pt will report increased bowel regularity from every 3 days to daily bowel movements in order to promote GI health    Time  5    Period  Weeks    Status  Partially Met      PT LONG TERM GOAL #7   Title  Pt will demo increased score on FOTO with Urinary function 71 pts to > 90 pt, Constipation 55 pts to > 70 pts, and decreased score with Pain from 38 pts to < 28 pts in order to demo improved function and improve QOL  (08/04/19:  8 pts indicates better function for Pain)    Time  10    Period  Weeks    Status  Partially Met      PT LONG TERM GOAL #8   Title  Pt will report no pain with sexual intercourse in order to improve QOL    Time  10    Period  Weeks    Status  On-going            Plan - 08/19/19 0205    Clinical Impression Statement  Pt required progression to multifidis twist to minimize lumbar lordosis and strengthen back muscles. Added closed kinetic chain for hip extension and abduction strengthening. Hip abduction strength continues to improve. Progressed to step up/down but pt required cues for eccentric control. Less cues required by pronation/ genu valgus. Pt continues to benefit from skilled PT.   Examination-Activity Limitations  Continence;Other;Hygiene/Grooming    Stability/Clinical Decision Making  Evolving/Moderate complexity    Rehab Potential  Good    PT Frequency  1x / week    PT Duration  Other (comment)   10   PT Treatment/Interventions  Therapeutic activities;Functional mobility training;Moist Heat;Gait training;Therapeutic  exercise;Balance training;Patient/family education;Neuromuscular re-education;Manual techniques;Manual lymph drainage;Scar mobilization    Consulted and Agree with Plan of Care  Patient       Patient will benefit from skilled therapeutic intervention in order to improve the following deficits and impairments:  Decreased activity tolerance, Decreased  coordination, Decreased knowledge of use of DME, Decreased safety awareness, Increased fascial restricitons, Impaired flexibility, Postural dysfunction, Improper body mechanics, Decreased range of motion, Decreased knowledge of precautions, Decreased scar mobility, Decreased endurance, Decreased balance, Decreased skin integrity, Decreased mobility, Hypomobility, Increased muscle spasms, Impaired sensation  Visit Diagnosis: Sacrococcygeal disorders, not elsewhere classified  Other lack of coordination  Other muscle spasm  Other idiopathic scoliosis, thoracolumbar region  Other symptoms and signs involving the musculoskeletal system  Acute left ankle pain     Problem List Patient Active Problem List   Diagnosis Date Noted  . Prothrombin G20210A mutation ([place]) 07/05/2019  . Anxiety and depression 12/30/2017  . PCOS (polycystic ovarian syndrome) 12/30/2017  . Insomnia 12/30/2017  . Rash 09/18/2016  . History of UTI 06/17/2016  . Heel pain 01/15/2016    Jerl Mina ,PT, DPT, E-RYT  08/19/2019, 2:09 AM  [place] MAIN [facility] SERVICES [address] [place], [place], [address] Phone: [phone]   Fax:  [phone]  Name: EVADEAN SPROULE MRN: [id number] Date of Birth: April 06, 1991

## 2019-08-22 ENCOUNTER — Other Ambulatory Visit: Payer: Self-pay

## 2019-08-22 ENCOUNTER — Encounter: Payer: Self-pay | Admitting: Family

## 2019-08-22 ENCOUNTER — Ambulatory Visit: Payer: Managed Care, Other (non HMO) | Admitting: Family

## 2019-08-22 VITALS — BP 102/64 | HR 89 | Temp 97.2°F | Ht 67.0 in | Wt 128.2 lb

## 2019-08-22 DIAGNOSIS — F419 Anxiety disorder, unspecified: Secondary | ICD-10-CM | POA: Diagnosis not present

## 2019-08-22 DIAGNOSIS — F329 Major depressive disorder, single episode, unspecified: Secondary | ICD-10-CM

## 2019-08-22 DIAGNOSIS — F32A Depression, unspecified: Secondary | ICD-10-CM

## 2019-08-22 NOTE — Patient Instructions (Signed)
Nice to see you  Referral to psychiatry Let us know if you dont hear back within a week in regards to an appointment being scheduled.

## 2019-08-22 NOTE — Assessment & Plan Note (Addendum)
Some improvement on addition of Zoloft.  Overall patient's anxiety seems moderately controlled on Wellbutrin and Zoloft.  We discussed again fatigue related to depression.  Encouraged increased exercise as she is doing.   in conversation today, we jointly agreed there is some concern for bipolar although I agree with patient that anxiety may be leading to increased energy.  advised that SSRIs can contribute to manic episode.  Patient declines coming off Zoloft today ; we will continue this low-dose medication until evaluated by psychiatry. She will let me know how she is doing.

## 2019-08-22 NOTE — Progress Notes (Signed)
Subjective:    Patient ID: [name], female    DOB: 25-Jan-1991, 29 y.o.   MRN: [id number]  CC: [name] is a 29 y.o. female who presents today for follow up.   HPI: Depression- feeling better on zoloft. Sleeping better. Still feels fatigued.  [name] is helpful.  Increased exercise , walking and pelvic exercises which has been helpful.  Has been evaluated by psychiatrist in the past and told she is not bipolar.  However she does admits to remote episodes having more energy than usual in which didn't require sleep for as long as 4 days. She has in the past endorsed spending more money than usual and got into trouble.  She feels like these periods of increased energy are related to her anxiety for which she feels better on Wellbutrin and Zoloft.  No si/hi.  No drug or alcohol use. Father has bipolar.     Pelvic pain- improved since with dietary changes,  PT, and even feels like coming off of OCP has be helpful.  Is planning follow-up with gastroenterology  Consult with [facility] November 05/2019 Prothrombin gene mutation    HISTORY:  Past Medical History:  Diagnosis Date  . Asthma   . PCOS (polycystic ovarian syndrome)   . UTI (urinary tract infection)    Past Surgical History:  Procedure Laterality Date  . BREAST ENHANCEMENT SURGERY  2012  . SHOULDER SURGERY    . TONSILLECTOMY     Family History  Problem Relation Age of Onset  . Arthritis Mother   . Heart disease Mother   . Stroke Mother   . Hypertension Mother   . Mental illness Mother   . Diabetes Mother   . Factor V Leiden deficiency Mother   . Sleep apnea Mother   . Mental illness Father   . Diabetes Father   . CAD Father   . Sleep apnea Father   . Bleeding Disorder Father        prothrombin gene mutuation  . Crohn's disease Brother   . Kidney failure Paternal Grandfather   . Breast cancer Neg Hx   . Ovarian cancer Neg Hx   . Colon cancer Neg Hx     Allergies: Aspirin and Levaquin [levofloxacin  in d5w] Current Outpatient Medications on File Prior to Visit  Medication Sig Dispense Refill  . buPROPion (WELLBUTRIN XL) 300 MG 24 hr tablet TAKE 1 TABLET(150 MG) BY MOUTH DAILY 90 tablet 1  . Eszopiclone 3 MG TABS TAKE 1 TABLET BY MOUTH IMMEDIATELY BEFORE BEDTIME 30 tablet 1  . omeprazole (PRILOSEC) 20 MG capsule Take 20 mg by mouth daily.    . sertraline (ZOLOFT) 50 MG tablet Take 1 tablet (50 mg total) by mouth at bedtime. 90 tablet 3   No current facility-administered medications on file prior to visit.    Social History   Tobacco Use  . Smoking status: Never Smoker  . Smokeless tobacco: Never Used  Substance Use Topics  . Alcohol use: Not Currently  . Drug use: No    Review of Systems  Constitutional: Positive for fatigue. Negative for chills and fever.  Respiratory: Negative for cough.   Cardiovascular: Negative for chest pain and palpitations.  Gastrointestinal: Negative for nausea and vomiting.  Genitourinary: Positive for pelvic pain.  Psychiatric/Behavioral: Positive for sleep disturbance. Negative for suicidal ideas. The patient is nervous/anxious.       Objective:    BP 102/64   Pulse 89   Temp (!) 97.2 F (  36.2 C) (Temporal)   Ht 5\' 7"  (1.702 m)   Wt 128 lb 3.2 oz (58.2 kg)   SpO2 97%   BMI 20.08 kg/m  BP Readings from Last 3 Encounters:  08/22/19 102/64  07/04/19 126/84  06/08/19 (!) 162/125   Wt Readings from Last 3 Encounters:  08/22/19 128 lb 3.2 oz (58.2 kg)  07/04/19 127 lb 3.2 oz (57.7 kg)  06/14/19 126 lb (57.2 kg)    Physical Exam Vitals reviewed.  Constitutional:      Appearance: She is well-developed.  Eyes:     Conjunctiva/sclera: Conjunctivae normal.  Cardiovascular:     Rate and Rhythm: Normal rate and regular rhythm.     Pulses: Normal pulses.     Heart sounds: Normal heart sounds.  Pulmonary:     Effort: Pulmonary effort is normal.     Breath sounds: Normal breath sounds. No wheezing, rhonchi or rales.  Skin:    General:  Skin is warm and dry.  Neurological:     Mental Status: She is alert.  Psychiatric:        Speech: Speech normal.        Behavior: Behavior normal.        Thought Content: Thought content normal.        Assessment & Plan:   Problem List Items Addressed This Visit      Other   Anxiety and depression - Primary    Some improvement on addition of Zoloft.  Overall patient's anxiety seems moderately controlled on Wellbutrin and Zoloft.  We discussed again fatigue related to depression.  Encouraged increased exercise as she is doing.   in conversation today, we jointly agreed there is some concern for bipolar although I agree with patient that anxiety may be leading to increased energy.  advised that SSRIs can contribute to manic episode.  Patient declines coming off Zoloft today ; we will continue this low-dose medication until evaluated by psychiatry. She will let me know how she is doing.      Relevant Orders   Ambulatory referral to Psychiatry       I am having 06/16/19 maintain her sertraline, buPROPion, omeprazole, and Eszopiclone.   No orders of the defined types were placed in this encounter.   Return precautions given.   Risks, benefits, and alternatives of the medications and treatment plan prescribed today were discussed, and patient expressed understanding.   Education regarding symptom management and diagnosis given to patient on AVS.  Continue to follow with Ulysees Barns, FNP for routine health maintenance.   [name] and I agreed with plan.   Ulysees Barns, FNP

## 2019-08-25 ENCOUNTER — Encounter: Payer: Managed Care, Other (non HMO) | Admitting: Physical Therapy

## 2019-08-28 ENCOUNTER — Other Ambulatory Visit: Payer: Self-pay | Admitting: Family

## 2019-08-28 DIAGNOSIS — G4700 Insomnia, unspecified: Secondary | ICD-10-CM

## 2019-08-29 NOTE — Telephone Encounter (Signed)
Refill request for Eszopiclone , last seen 08-22-19, last filled 07-01-19.  Please advise.

## 2019-09-01 ENCOUNTER — Other Ambulatory Visit: Payer: Self-pay

## 2019-09-01 ENCOUNTER — Ambulatory Visit: Payer: Managed Care, Other (non HMO) | Attending: Obstetrics and Gynecology | Admitting: Physical Therapy

## 2019-09-01 DIAGNOSIS — M62838 Other muscle spasm: Secondary | ICD-10-CM | POA: Insufficient documentation

## 2019-09-01 DIAGNOSIS — M25572 Pain in left ankle and joints of left foot: Secondary | ICD-10-CM | POA: Insufficient documentation

## 2019-09-01 DIAGNOSIS — R29898 Other symptoms and signs involving the musculoskeletal system: Secondary | ICD-10-CM | POA: Diagnosis present

## 2019-09-01 DIAGNOSIS — M4125 Other idiopathic scoliosis, thoracolumbar region: Secondary | ICD-10-CM

## 2019-09-01 DIAGNOSIS — R278 Other lack of coordination: Secondary | ICD-10-CM | POA: Diagnosis present

## 2019-09-01 DIAGNOSIS — M533 Sacrococcygeal disorders, not elsewhere classified: Secondary | ICD-10-CM | POA: Diagnosis not present

## 2019-09-02 NOTE — Patient Instructions (Signed)
Sitting on yoga blocks and towels under knees with butterfly , v stretch on the floor to pe anterior tilt of pelvis    Side plank principles   Mini side push ups   Elbow not hypextended

## 2019-09-02 NOTE — Therapy (Signed)
[place] MAIN [facility] SERVICES [address] [place], [place], [address] Phone: [phone]   Fax:  [phone]  Physical Therapy Treatment / Progress Note   Patient Details  Name: [name] MRN: [id number] Date of Birth: 1991-03-12 Referring Provider (PT): Leafy Ro   Encounter Date: 09/01/2019  PT End of Session - 09/02/19 1225    Visit Number  10    Number of Visits  18    Date for PT Re-Evaluation  10/21/19   eval 06/23/19   PT Start Time  1500    PT Stop Time  1600    PT Time Calculation (min)  60 min    Activity Tolerance  Patient tolerated treatment well       Past Medical History:  Diagnosis Date  . Asthma   . PCOS (polycystic ovarian syndrome)   . UTI (urinary tract infection)     Past Surgical History:  Procedure Laterality Date  . BREAST ENHANCEMENT SURGERY  2012  . SHOULDER SURGERY    . TONSILLECTOMY      There were no vitals filed for this visit.  Subjective Assessment - 09/01/19 1506    Subjective  Pt had not had a chance to do her HEP due to 2 deaths in her family.    Pertinent History  Two years ago, pt had a UTI that pt was hospitalized for because there was pus, blood. Pt recovered it after 7 days. Since menstrual cycles started at 29 years old, pt has had terrible cramps, severe clotting, headaches, irritability. Periods would occur irregularly. At one point, period did not occur for 6 months. Pt was on Yaz birth control pill but she has recently been off of it due to mother having a clotting gene.  Pt reports that at the time when she travelled on the cruise, pt un intentionally stopped taking her birth control pills with 1.5 weeks of pills left in her pack because she left it on the ship. Her period came 3 days after the last time she took her pill. During this menstrual period is when she ended in the ED with the period like pelvic pain.  Surgeries: breast augmentation 2012, bicep muscle repair for rupture on R  2013, tonsilectomy    Patient Stated Goals  less pelvic pain , less cramping, and more core strength         [facility] PT Assessment - 09/02/19 1228      Observation/Other Assessments   Observations  hypomobility of elbows in yoga poses       Strength   Overall Strength Comments  hip abd R 4-/5, L 4+/5, hip ext L 4-/5, R 4+/5                    [facility] Adult PT Treatment/Exercise - 09/02/19 1228      Therapeutic Activites    Other Therapeutic Activities  explained yoga alignment principles to minimize injuries, modifications to sit on floor with anterior tilti of pelvis to minimzie relapse of Sx       Neuro Re-ed    Neuro Re-ed Details   cued for UE stabilization  in core and thoracolumbar strengthening exercises, anterior tilt in floor exercises                   PT Long Term Goals - 09/02/19 1236      PT LONG TERM GOAL #1   Title  Pt will report decreased pelvic pain epsiodes  from 2 weeks/ month to < 1 week / month in order to increased QOL    Time  8    Period  Weeks    Status  Achieved      PT LONG TERM GOAL #2   Title  Pt will report decreased abdominal menstrual related cramping from 8/10 to < 4/10 in order demo self-management    Time  4    Period  Weeks    Status  Achieved      PT LONG TERM GOAL #3   Title  Pt's strength from increase from  hip abd 3/5 B, hip ext 2+/5 L, 3/5 R with lumbar lordosis to hip abd/ ext 4+/5 B  in order minimize pain and walk for aerobic exercise  ( 08/04/19:  3+/5 R hip abd/ 4-/5 hip abd, hip ext R 3+/5, L 4-/5 )    Time  8    Period  Weeks    Status  On-going      PT LONG TERM GOAL #4   Title  Pt will demo equal iliac crest and no lumbar convex curve across 2 sessions in order to progress to deep core strengthening exercises    Time  2    Period  Weeks    Status  Achieved      PT LONG TERM GOAL #5   Title  Pt will demo proper body mechanics to maintain SIJ / spinal alignment and to minimize straining pelvic floor  in order to minimize relapse of Sx    Time  6    Period  Weeks    Status  Achieved      PT LONG TERM GOAL #6   Title  Pt will report increased bowel regularity from every 3 days to daily bowel movements in order to promote GI health    Time  5    Period  Weeks    Status  Partially Met      PT LONG TERM GOAL #7   Title  Pt will demo increased score on FOTO with Urinary function 71 pts to > 90 pt, Constipation 55 pts to > 70 pts, and decreased score with Pain from 38 pts to < 28 pts in order to demo improved function and improve QOL  (08/04/19:  8 pts indicates better function for Pain)    Time  10    Period  Weeks    Status  Partially Met      PT LONG TERM GOAL #8   Title  Pt will report no pain with sexual intercourse in order to improve QOL    Time  10    Period  Weeks    Status  On-going            Plan - 09/02/19 1226    Clinical Impression Statement Pt has achieved 50% of her goals. Pt has had less pelvic pain, less cramps / pain with menstrual cycles. Pt has demo'd more equal alignment of pelvic girdle, stronger deep core/ back mm, less lower kinetic chain deficits ( genu valgus, medial collapse),  less hip mm/ pelvic floor tightness, and less spinal deviations. Pt continues to benefit from skilled PTto achieve remaining goals. Today, provided Tx  to increase hip and glut strength and to learn proper alignment and technique to minimize injuries with yoga practices.     Examination-Activity Limitations  Continence;Other;Hygiene/Grooming    Stability/Clinical Decision Making  Evolving/Moderate complexity    Rehab Potential  Good  PT Frequency  1x / week    PT Duration  Other (comment)   10   PT Treatment/Interventions  Therapeutic activities;Functional mobility training;Moist Heat;Gait training;Therapeutic exercise;Balance training;Patient/family education;Neuromuscular re-education;Manual techniques;Manual lymph drainage;Scar mobilization    Consulted and Agree with Plan  of Care  Patient       Patient will benefit from skilled therapeutic intervention in order to improve the following deficits and impairments:  Decreased activity tolerance, Decreased coordination, Decreased knowledge of use of DME, Decreased safety awareness, Increased fascial restricitons, Impaired flexibility, Postural dysfunction, Improper body mechanics, Decreased range of motion, Decreased knowledge of precautions, Decreased scar mobility, Decreased endurance, Decreased balance, Decreased skin integrity, Decreased mobility, Hypomobility, Increased muscle spasms, Impaired sensation  Visit Diagnosis: Sacrococcygeal disorders, not elsewhere classified  Other lack of coordination  Other muscle spasm  Other idiopathic scoliosis, thoracolumbar region  Other symptoms and signs involving the musculoskeletal system     Problem List Patient Active Problem List   Diagnosis Date Noted  . Prothrombin G20210A mutation ([place]) 07/05/2019  . Anxiety and depression 12/30/2017  . PCOS (polycystic ovarian syndrome) 12/30/2017  . Insomnia 12/30/2017  . Rash 09/18/2016  . History of UTI 06/17/2016  . Heel pain 01/15/2016    Jerl Mina ,PT, DPT, E-RYT  09/02/2019, 12:39 PM  [place] MAIN [facility] SERVICES [address] [place], [place], [address] Phone: [phone]   Fax:  [phone]  Name: DALLANA MAVITY MRN: [id number] Date of Birth: 1990/07/19

## 2019-09-20 ENCOUNTER — Ambulatory Visit: Payer: Managed Care, Other (non HMO) | Admitting: Physical Therapy

## 2019-09-20 ENCOUNTER — Other Ambulatory Visit: Payer: Self-pay

## 2019-09-20 DIAGNOSIS — M533 Sacrococcygeal disorders, not elsewhere classified: Secondary | ICD-10-CM | POA: Diagnosis not present

## 2019-09-20 DIAGNOSIS — R278 Other lack of coordination: Secondary | ICD-10-CM

## 2019-09-20 DIAGNOSIS — M4125 Other idiopathic scoliosis, thoracolumbar region: Secondary | ICD-10-CM

## 2019-09-20 DIAGNOSIS — M25572 Pain in left ankle and joints of left foot: Secondary | ICD-10-CM

## 2019-09-20 DIAGNOSIS — R29898 Other symptoms and signs involving the musculoskeletal system: Secondary | ICD-10-CM

## 2019-09-20 DIAGNOSIS — M62838 Other muscle spasm: Secondary | ICD-10-CM

## 2019-09-20 NOTE — Patient Instructions (Signed)
Stretch for pelvic floor   V- slides  "v heels slide away and then back toward buttocks and then rock knee to slight ,  slide heel along at 11 o clock away from buttocks   10 reps     On belly: Riding horse edge of mattress  knee bent like riding a horse, move knee towards armpit and out  10 reps   Mermaid stretch  Rocking while seated on the floor with heels to one side of the hip Heels to one side of the hip  Rock forward towards the knee that is bent , rock beck towards the opposite sitting bones   

## 2019-09-21 NOTE — Therapy (Signed)
[place] MAIN [facility] SERVICES [address] [place], [place], [address] Phone: [phone]   Fax:  [phone]  Physical Therapy Treatment  Patient Details  Name: [name] MRN: [id number] Date of Birth: [birth year] Referring Provider (PT): Leafy Ro   Encounter Date: 09/20/2019  PT End of Session - 09/20/19 1715    Visit Number  11    Number of Visits  18    Date for PT Re-Evaluation  11/11/19   eval 06/23/19   PT Start Time  1700    PT Stop Time  1800    PT Time Calculation (min)  60 min    Activity Tolerance  Patient tolerated treatment well       Past Medical History:  Diagnosis Date  . Asthma   . PCOS (polycystic ovarian syndrome)   . UTI (urinary tract infection)     Past Surgical History:  Procedure Laterality Date  . BREAST ENHANCEMENT SURGERY  2012  . SHOULDER SURGERY    . TONSILLECTOMY      There were no vitals filed for this visit.  Subjective Assessment - 09/20/19 1705    Subjective  Pt reported she had more cramps with her menstrual cycle this monrth. Pt did night time yoga and used Honey Pot menstrual pads that helped with the pain. Pt did not have skin irritation from these pads like from other pads.    Pertinent History  Two years ago, pt had a UTI that pt was hospitalized for because there was pus, blood. Pt recovered it after 7 days. Since menstrual cycles started at 29 years old, pt has had terrible cramps, severe clotting, headaches, irritability. Periods would occur irregularly. At one point, period did not occur for 6 months. Pt was on Yaz birth control pill but she has recently been off of it due to mother having a clotting gene.  Pt reports that at the time when she travelled on the cruise, pt un intentionally stopped taking her birth control pills with 1.5 weeks of pills left in her pack because she left it on the ship. Her period came 3 days after the last time she took her pill. During this menstrual period  is when she ended in the ED with the period like pelvic pain.  Surgeries: breast augmentation 2012, bicep muscle repair for rupture on R 2013, tonsilectomy    Patient Stated Goals  less pelvic pain , less cramping, and more core strength         [facility] PT Assessment - 09/21/19 0954      Strength   Overall Strength Comments  hip abd 4+/5 B                  Pelvic Floor Special Questions - 09/21/19 0955    Pelvic Floor Internal Exam  pt consented verbally without contraindications     Exam Type  Vaginal    Palpation  increased tightness at B at introitus/ 5-7 o clock         [facility] Adult PT Treatment/Exercise - 09/21/19 0956      Therapeutic Activites    Other Therapeutic Activities  reassessed strength, pain science education, antomy and phsyiology education on pelvic floor and its role with sexual function       Neuro Re-ed    Neuro Re-ed Details   cued for less chest breathing       Moist Heat Therapy   Number Minutes Moist Heat  5 Minutes  Moist Heat Location  --   perineum through sheets      Manual Therapy   Internal Pelvic Floor  STM/MWM  at areas noted in assessment                  PT Long Term Goals - 09/20/19 1707      PT LONG TERM GOAL #1   Title  Pt will report decreased pelvic pain epsiodes from 2 weeks/ month to < 1 week / month in order to increased QOL    Time  8    Period  Weeks    Status  Achieved      PT LONG TERM GOAL #2   Title  Pt will report decreased abdominal menstrual related cramping from 8/10 to < 4/10 in order demo self-management    Time  4    Period  Weeks    Status  Achieved      PT LONG TERM GOAL #3   Title  Pt's strength from increase from  hip abd 3/5 B, hip ext 2+/5 L, 3/5 R with lumbar lordosis to hip abd/ ext 4+/5 B  in order minimize pain and walk for aerobic exercise  ( 08/04/19:  3+/5 R hip abd/ 4-/5 hip abd, hip ext R 3+/5, L 4-/5 )  (09/20/19:  hip ext 4-/5 L, 4+/5 R , hip abd 3+/5 L, 4-/5)    Time  8     Period  Weeks    Status  On-going      PT LONG TERM GOAL #4   Title  Pt will demo equal iliac crest and no lumbar convex curve across 2 sessions in order to progress to deep core strengthening exercises    Time  2    Period  Weeks    Status  Achieved      PT LONG TERM GOAL #5   Title  Pt will demo proper body mechanics to maintain SIJ / spinal alignment and to minimize straining pelvic floor in order to minimize relapse of Sx    Time  6    Period  Weeks    Status  Achieved      PT LONG TERM GOAL #6   Title  Pt will report increased bowel regularity from every 3 days to daily bowel movements in order to promote GI health  ( 09/20/19: once to every other day)    Time  5    Period  Weeks    Status  Achieved      PT LONG TERM GOAL #7   Title  Pt will demo increased score on FOTO with Urinary function 71 pts to > 90 pt, Constipation 55 pts to > 70 pts, and decreased score with Pain from 38 pts to < 28 pts in order to demo improved function and improve QOL  (08/04/19:  8 pts indicates better function for Pain)    Time  10    Period  Weeks    Status  Partially Met      PT LONG TERM GOAL #8   Title  Pt will report no pain with sexual intercourse in order to improve QOL    Time  10    Period  Weeks    Status  On-going            Plan - 09/21/19 2993    Clinical Impression Statement Pt demo'd increased pelvic floor mm tightness through internal assessment. Pt toelrated assessment and showed increased pelvic  floor mobility post Tx but will still need more internal Tx at next session. Pt was provided education on pain science education and role of pelvic floor function, posture, relaxation with sexual function to help pt understand experience less pain with intercourse which pt has had.  Pt continues to required more hip abduction strengthening as well.  Pt continues to benefit from skilled PT.    Examination-Activity Limitations  Continence;Other;Hygiene/Grooming    Stability/Clinical  Decision Making  Evolving/Moderate complexity    Rehab Potential  Good    PT Frequency  1x / week    PT Duration  Other (comment)   10   PT Treatment/Interventions  Therapeutic activities;Functional mobility training;Moist Heat;Gait training;Therapeutic exercise;Balance training;Patient/family education;Neuromuscular re-education;Manual techniques;Manual lymph drainage;Scar mobilization    Consulted and Agree with Plan of Care  Patient       Patient will benefit from skilled therapeutic intervention in order to improve the following deficits and impairments:  Decreased activity tolerance, Decreased coordination, Decreased knowledge of use of DME, Decreased safety awareness, Increased fascial restricitons, Impaired flexibility, Postural dysfunction, Improper body mechanics, Decreased range of motion, Decreased knowledge of precautions, Decreased scar mobility, Decreased endurance, Decreased balance, Decreased skin integrity, Decreased mobility, Hypomobility, Increased muscle spasms, Impaired sensation  Visit Diagnosis: Sacrococcygeal disorders, not elsewhere classified  Other lack of coordination  Other muscle spasm  Other idiopathic scoliosis, thoracolumbar region  Other symptoms and signs involving the musculoskeletal system  Acute left ankle pain     Problem List Patient Active Problem List   Diagnosis Date Noted  . Prothrombin G20210A mutation ([place]) 07/05/2019  . Anxiety and depression 12/30/2017  . PCOS (polycystic ovarian syndrome) 12/30/2017  . Insomnia 12/30/2017  . Rash 09/18/2016  . History of UTI 06/17/2016  . Heel pain 01/15/2016    Jerl Mina ,PT, DPT, E-RYT  09/21/2019, 10:20 AM  [place] MAIN [facility] SERVICES [address] [place], [place], [address] Phone: [phone]   Fax:  [phone]  Name: SALLY MENARD MRN: [id number] Date of Birth: [birth year]

## 2019-10-03 ENCOUNTER — Other Ambulatory Visit: Payer: Self-pay

## 2019-10-03 ENCOUNTER — Ambulatory Visit: Payer: Managed Care, Other (non HMO) | Attending: Obstetrics and Gynecology | Admitting: Physical Therapy

## 2019-10-03 DIAGNOSIS — R278 Other lack of coordination: Secondary | ICD-10-CM

## 2019-10-03 DIAGNOSIS — M62838 Other muscle spasm: Secondary | ICD-10-CM | POA: Diagnosis present

## 2019-10-03 DIAGNOSIS — M533 Sacrococcygeal disorders, not elsewhere classified: Secondary | ICD-10-CM | POA: Diagnosis present

## 2019-10-03 NOTE — Therapy (Signed)
[place] MAIN [facility] SERVICES [address] [place], [place], [address] Phone: [phone]   Fax:  [phone]  Physical Therapy Treatment  Patient Details  Name: [name] MRN: [id number] Date of Birth: 1990/12/30 Referring Provider (PT): Leafy Ro   Encounter Date: 10/03/2019  PT End of Session - 10/03/19 1655    Visit Number  12    Number of Visits  18    Date for PT Re-Evaluation  11/11/19   eval 06/23/19   PT Start Time  7209    PT Stop Time  1758    PT Time Calculation (min)  54 min    Activity Tolerance  Patient tolerated treatment well       Past Medical History:  Diagnosis Date  . Asthma   . PCOS (polycystic ovarian syndrome)   . UTI (urinary tract infection)     Past Surgical History:  Procedure Laterality Date  . BREAST ENHANCEMENT SURGERY  2012  . SHOULDER SURGERY    . TONSILLECTOMY      There were no vitals filed for this visit.  Subjective Assessment - 10/03/19 1610    Subjective  Pt has had pelvic floor pain that has been minor and has been off and on.  Pt is supposed to start her period this weekend and will see if she gets teh pelvic pain Sx.  Last month she did not have pelvic pain during menstrual cycle    Pertinent History  Two years ago, pt had a UTI that pt was hospitalized for because there was pus, blood. Pt recovered it after 7 days. Since menstrual cycles started at 29 years old, pt has had terrible cramps, severe clotting, headaches, irritability. Periods would occur irregularly. At one point, period did not occur for 6 months. Pt was on Yaz birth control pill but she has recently been off of it due to mother having a clotting gene.  Pt reports that at the time when she travelled on the cruise, pt un intentionally stopped taking her birth control pills with 1.5 weeks of pills left in her pack because she left it on the ship. Her period came 3 days after the last time she took her pill. During this menstrual  period is when she ended in the ED with the period like pelvic pain.  Surgeries: breast augmentation 2012, bicep muscle repair for rupture on R 2013, tonsilectomy    Patient Stated Goals  less pelvic pain , less cramping, and more core strength         [facility] PT Assessment - 10/03/19 1654      Coordination   Gross Motor Movements are Fluid and Coordinated  --   chest breathing    Fine Motor Movements are Fluid and Coordinated  --   limited pelvic floor expansion                 Pelvic Floor Special Questions - 10/03/19 1653    External Perineal Exam  tightness at ischial tuberosities     Pelvic Floor Internal Exam  pt consented verbally without contraindications     Exam Type  Vaginal    Palpation  increased tightness at B 6-9  o clock, obturator, piriformis, external rotators B          [facility] Adult PT Treatment/Exercise - 10/03/19 1654      Neuro Re-ed    Neuro Re-ed Details   excessive cues for diaphragmatic and pelvic floor excursion  Moist Heat Therapy   Number Minutes Moist Heat  5 Minutes    Moist Heat Location  --   sacrum and perineum      Manual Therapy   Internal Pelvic Floor  STM/MWM  at areas noted in assessment                  PT Long Term Goals - 09/20/19 1707      PT LONG TERM GOAL #1   Title  Pt will report decreased pelvic pain epsiodes from 2 weeks/ month to < 1 week / month in order to increased QOL    Time  8    Period  Weeks    Status  Achieved      PT LONG TERM GOAL #2   Title  Pt will report decreased abdominal menstrual related cramping from 8/10 to < 4/10 in order demo self-management    Time  4    Period  Weeks    Status  Achieved      PT LONG TERM GOAL #3   Title  Pt's strength from increase from  hip abd 3/5 B, hip ext 2+/5 L, 3/5 R with lumbar lordosis to hip abd/ ext 4+/5 B  in order minimize pain and walk for aerobic exercise  ( 08/04/19:  3+/5 R hip abd/ 4-/5 hip abd, hip ext R 3+/5, L 4-/5 )  (09/20/19:  hip  ext 4-/5 L, 4+/5 R , hip abd 3+/5 L, 4-/5)    Time  8    Period  Weeks    Status  On-going      PT LONG TERM GOAL #4   Title  Pt will demo equal iliac crest and no lumbar convex curve across 2 sessions in order to progress to deep core strengthening exercises    Time  2    Period  Weeks    Status  Achieved      PT LONG TERM GOAL #5   Title  Pt will demo proper body mechanics to maintain SIJ / spinal alignment and to minimize straining pelvic floor in order to minimize relapse of Sx    Time  6    Period  Weeks    Status  Achieved      PT LONG TERM GOAL #6   Title  Pt will report increased bowel regularity from every 3 days to daily bowel movements in order to promote GI health  ( 09/20/19: once to every other day)    Time  5    Period  Weeks    Status  Achieved      PT LONG TERM GOAL #7   Title  Pt will demo increased score on FOTO with Urinary function 71 pts to > 90 pt, Constipation 55 pts to > 70 pts, and decreased score with Pain from 38 pts to < 28 pts in order to demo improved function and improve QOL  (08/04/19:  8 pts indicates better function for Pain)    Time  10    Period  Weeks    Status  Partially Met      PT LONG TERM GOAL #8   Title  Pt will report no pain with sexual intercourse in order to improve QOL    Time  10    Period  Weeks    Status  On-going            Plan - 10/03/19 1656    Clinical Impression Statement Pt was  able to internal pelvic floor mm at 2nd-3rd layers of pelvic floor today. External techniques at ischituberosities was applied to minimize tenderness. Pt demo'd more lengthened pelvic floor mm with excessive cues for less chest breathing, less tensioning ab mm. Pt continues to benefit from skilled PT   Examination-Activity Limitations  Continence;Other;Hygiene/Grooming    Stability/Clinical Decision Making  Evolving/Moderate complexity    Rehab Potential  Good    PT Frequency  1x / week    PT Duration  Other (comment)   10   PT  Treatment/Interventions  Therapeutic activities;Functional mobility training;Moist Heat;Gait training;Therapeutic exercise;Balance training;Patient/family education;Neuromuscular re-education;Manual techniques;Manual lymph drainage;Scar mobilization    Consulted and Agree with Plan of Care  Patient       Patient will benefit from skilled therapeutic intervention in order to improve the following deficits and impairments:  Decreased activity tolerance, Decreased coordination, Decreased knowledge of use of DME, Decreased safety awareness, Increased fascial restricitons, Impaired flexibility, Postural dysfunction, Improper body mechanics, Decreased range of motion, Decreased knowledge of precautions, Decreased scar mobility, Decreased endurance, Decreased balance, Decreased skin integrity, Decreased mobility, Hypomobility, Increased muscle spasms, Impaired sensation  Visit Diagnosis: Sacrococcygeal disorders, not elsewhere classified  Other lack of coordination  Other muscle spasm     Problem List Patient Active Problem List   Diagnosis Date Noted  . Prothrombin G20210A mutation ([place]) 07/05/2019  . Anxiety and depression 12/30/2017  . PCOS (polycystic ovarian syndrome) 12/30/2017  . Insomnia 12/30/2017  . Rash 09/18/2016  . History of UTI 06/17/2016  . Heel pain 01/15/2016    Jerl Mina ,PT, DPT, E-RYT  10/03/2019, 5:07 PM  [place] MAIN [facility] SERVICES [address] [place], [place], [address] Phone: [phone]   Fax:  [phone]  Name: [name] MRN: [id number] Date of Birth: December 11, 1990

## 2019-10-03 NOTE — Patient Instructions (Signed)
  Car seat modification  Folded towels to fill bucket seat Folded beach towel long ways against back of seat into head rest to fill in the space so your head and back and sacrum aligns  Hands at 9 and 3 o clock  Feet and heel on the mat  Back of the hips against the seat evenly     __  Diaphragmatic excursion for optimal pelvic floor lengthening
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
## 2019-10-15 ENCOUNTER — Encounter (HOSPITAL_COMMUNITY): Payer: Self-pay | Admitting: Psychiatry

## 2019-10-15 ENCOUNTER — Telehealth (INDEPENDENT_AMBULATORY_CARE_PROVIDER_SITE_OTHER): Payer: 59 | Admitting: Psychiatry

## 2019-10-15 ENCOUNTER — Other Ambulatory Visit: Payer: Self-pay

## 2019-10-15 DIAGNOSIS — F411 Generalized anxiety disorder: Secondary | ICD-10-CM | POA: Diagnosis not present

## 2019-10-15 DIAGNOSIS — F410 Panic disorder [episodic paroxysmal anxiety] without agoraphobia: Secondary | ICD-10-CM | POA: Diagnosis not present

## 2019-10-15 DIAGNOSIS — G4700 Insomnia, unspecified: Secondary | ICD-10-CM | POA: Diagnosis not present

## 2019-10-15 MED ORDER — SERTRALINE HCL 100 MG PO TABS: 100.0000 mg | ORAL_TABLET | Freq: Every day | ORAL | 1 refills | Status: DC

## 2019-10-15 NOTE — Patient Instructions (Signed)
1. Increase sertraline 100 mg daily 2. Continue Bupropion 300 mg daily 3. Continue Lunesta 3 mg at night as needed for sleep 4. Referral to therapy  5. Next appointment' 7/30 at 11:30

## 2019-10-17 ENCOUNTER — Other Ambulatory Visit: Payer: Self-pay

## 2019-10-17 ENCOUNTER — Ambulatory Visit: Payer: Managed Care, Other (non HMO) | Admitting: Physical Therapy

## 2019-10-17 DIAGNOSIS — M533 Sacrococcygeal disorders, not elsewhere classified: Secondary | ICD-10-CM

## 2019-10-17 DIAGNOSIS — M62838 Other muscle spasm: Secondary | ICD-10-CM

## 2019-10-17 DIAGNOSIS — R278 Other lack of coordination: Secondary | ICD-10-CM

## 2019-10-17 NOTE — Therapy (Signed)
[place] MAIN [facility] SERVICES [address] [place], [place], [address] Phone: [phone]   Fax:  [phone]  Physical Therapy Treatment  Patient Details  Name: [name] MRN: [id number] Date of Birth: 09-02-1990 Referring Provider (PT): Leafy Ro   Encounter Date: 10/17/2019   PT End of Session - 10/17/19 6384    Visit Number 13    Number of Visits 18    Date for PT Re-Evaluation 11/11/19   eval 06/23/19   PT Start Time 1504    PT Stop Time 1555    PT Time Calculation (min) 51 min    Activity Tolerance Patient tolerated treatment well           Past Medical History:  Diagnosis Date  . Asthma   . PCOS (polycystic ovarian syndrome)   . UTI (urinary tract infection)     Past Surgical History:  Procedure Laterality Date  . BREAST ENHANCEMENT SURGERY  2012  . SHOULDER SURGERY    . TONSILLECTOMY      There were no vitals filed for this visit.   Subjective Assessment - 10/17/19 1508    Subjective Pt thinks her PCOS is starting to kick in. Pt still has cramps and tired and her period not starting. Pt is 11-12 days late. Pt had lots of bloating and stomache, LBP. Pt has been on not been on birth control for 3 months because of her clotting disorder. Pt reported she went  to see her plastic surgeon Dr. Vertell Limber and there is no sign of leakage, movement with her breast implants.    Pertinent History Two years ago, pt had a UTI that pt was hospitalized for because there was pus, blood. Pt recovered it after 7 days. Since menstrual cycles started at 29 years old, pt has had terrible cramps, severe clotting, headaches, irritability. Periods would occur irregularly. At one point, period did not occur for 6 months. Pt was on Yaz birth control pill but she has recently been off of it due to mother having a clotting gene.  Pt reports that at the time when she travelled on the cruise, pt un intentionally stopped taking her birth control pills with 1.5  weeks of pills left in her pack because she left it on the ship. Her period came 3 days after the last time she took her pill. During this menstrual period is when she ended in the ED with the period like pelvic pain.  Surgeries: breast augmentation 2012, bicep muscle repair for rupture on R 2013, tonsilectomy    Patient Stated Goals less pelvic pain , less cramping, and more core strength              [facility] PT Assessment - 10/17/19 1515      Single Leg Stance   Comments L with difficulty       Strength   Overall Strength Comments L hip ext 4-/5, R 5/5  , hip abd B 4+/5        Palpation   Spinal mobility upper lumbar convex curve slight    SI assessment  levelled     Palpation comment tight coccygeus, ischial tub attachments, hypomobile L SIJ lacking nutation                          [facility] Adult PT Treatment/Exercise - 10/17/19 1542      Neuro Re-ed    Neuro Re-ed Details  cued for hip ext  with band standing       Moist Heat Therapy   Number Minutes Moist Heat 5 Minutes    Moist Heat Location --   sacrum     Manual Therapy   Internal Pelvic Floor STM / MWM Grade III PA mob addressing tight coccygeus, ischial tub attachments, hypomobile L SIJ lacking nutation                        PT Long Term Goals - 09/20/19 1707      PT LONG TERM GOAL #1   Title Pt will report decreased pelvic pain epsiodes from 2 weeks/ month to < 1 week / month in order to increased QOL    Time 8    Period Weeks    Status Achieved      PT LONG TERM GOAL #2   Title Pt will report decreased abdominal menstrual related cramping from 8/10 to < 4/10 in order demo self-management    Time 4    Period Weeks    Status Achieved      PT LONG TERM GOAL #3   Title Pt's strength from increase from  hip abd 3/5 B, hip ext 2+/5 L, 3/5 R with lumbar lordosis to hip abd/ ext 4+/5 B  in order minimize pain and walk for aerobic exercise  ( 08/04/19:  3+/5 R hip abd/ 4-/5 hip abd, hip ext  R 3+/5, L 4-/5 )  (09/20/19:  hip ext 4-/5 L, 4+/5 R , hip abd 3+/5 L, 4-/5)    Time 8    Period Weeks    Status On-going      PT LONG TERM GOAL #4   Title Pt will demo equal iliac crest and no lumbar convex curve across 2 sessions in order to progress to deep core strengthening exercises    Time 2    Period Weeks    Status Achieved      PT LONG TERM GOAL #5   Title Pt will demo proper body mechanics to maintain SIJ / spinal alignment and to minimize straining pelvic floor in order to minimize relapse of Sx    Time 6    Period Weeks    Status Achieved      PT LONG TERM GOAL #6   Title Pt will report increased bowel regularity from every 3 days to daily bowel movements in order to promote GI health  ( 09/20/19: once to every other day)    Time 5    Period Weeks    Status Achieved      PT LONG TERM GOAL #7   Title Pt will demo increased score on FOTO with Urinary function 71 pts to > 90 pt, Constipation 55 pts to > 70 pts, and decreased score with Pain from 38 pts to < 28 pts in order to demo improved function and improve QOL  (08/04/19:  8 pts indicates better function for Pain)    Time 10    Period Weeks    Status Partially Met      PT LONG TERM GOAL #8   Title Pt will report no pain with sexual intercourse in order to improve QOL    Time 10    Period Weeks    Status On-going                 Plan - 10/17/19 1557    Clinical Impression Statement Pt demo'd increased hip abduction strength but still lacked  hip extensions strength on L. Pt demo'd increased hypomobility at L SIJ, coccygeus which decreased post Tx. Pt reported she has had a L ankle injury before which is likely associated with this L sided weakness.  Plan to see if pt's PCOS Sx that has started to occur again recently will subside with this MSK Tx and if there are no change, pt will benefit from a refer back to gynecologist.      Examination-Activity Limitations Continence;Other;Hygiene/Grooming     Stability/Clinical Decision Making Evolving/Moderate complexity    Rehab Potential Good    PT Frequency 1x / week    PT Duration Other (comment)   10   PT Treatment/Interventions Therapeutic activities;Functional mobility training;Moist Heat;Gait training;Therapeutic exercise;Balance training;Patient/family education;Neuromuscular re-education;Manual techniques;Manual lymph drainage;Scar mobilization    Consulted and Agree with Plan of Care Patient           Patient will benefit from skilled therapeutic intervention in order to improve the following deficits and impairments:  Decreased activity tolerance, Decreased coordination, Decreased knowledge of use of DME, Decreased safety awareness, Increased fascial restricitons, Impaired flexibility, Postural dysfunction, Improper body mechanics, Decreased range of motion, Decreased knowledge of precautions, Decreased scar mobility, Decreased endurance, Decreased balance, Decreased skin integrity, Decreased mobility, Hypomobility, Increased muscle spasms, Impaired sensation  Visit Diagnosis: Sacrococcygeal disorders, not elsewhere classified  Other lack of coordination  Other muscle spasm     Problem List Patient Active Problem List   Diagnosis Date Noted  . Prothrombin [id number] mutation ([place]) 07/05/2019  . Anxiety and depression 12/30/2017  . PCOS (polycystic ovarian syndrome) 12/30/2017  . Insomnia 12/30/2017  . Rash 09/18/2016  . History of UTI 06/17/2016  . Heel pain 01/15/2016    Jerl Mina ,PT, DPT, E-RYT  10/17/2019, 6:00 PM  [place] MAIN [facility] SERVICES [address] [place], [place], [address] Phone: [phone]   Fax:  [phone]  Name: [name] MRN: [id number] Date of Birth: 29-Nov-1990

## 2019-10-17 NOTE — Patient Instructions (Signed)
   Frog stretch: laying on belly with pillow under hips, knees bent, inhale do nothing, exhale let ankles fall apart   20 reps    Band at L ankle with hands on the chair, kick backs knee straight)  30 reps L only  Stretches of knee to chest and happy baby and figure 4

## 2019-10-24 ENCOUNTER — Other Ambulatory Visit: Payer: Self-pay

## 2019-10-24 ENCOUNTER — Ambulatory Visit: Payer: Managed Care, Other (non HMO) | Admitting: Physical Therapy

## 2019-10-24 DIAGNOSIS — M533 Sacrococcygeal disorders, not elsewhere classified: Secondary | ICD-10-CM

## 2019-10-24 DIAGNOSIS — R278 Other lack of coordination: Secondary | ICD-10-CM

## 2019-10-24 DIAGNOSIS — M62838 Other muscle spasm: Secondary | ICD-10-CM

## 2019-10-24 NOTE — Patient Instructions (Signed)
Complimentary stretches:   Hamstring stretch on the chair  one knee straight but not locked, toes up  lean to thigh, opposite arm up and over and turn to sky, high five   __    Stretch back with knee to chest    Stretches for your legs: LAYING on Back Use upper arms and elbows for stability when pulling strap Opposite knee bent and foot firm in align with hip   Strap on [place]: _strap, L knee bent, R [place] against strap and spread toes, rolling foot 15 deg out and in across midline.  10 reps each side   _knee bends  10 reps  With knee pointing straight    10 reps with knee pointing out towards armpit    _scoot hips to R, and drop L leg over    ___  Restorative pose with knees, ankles propped

## 2019-10-24 NOTE — Therapy (Addendum)
[place] MAIN [facility] SERVICES [address] [place], [place], [address] Phone: [phone]   Fax:  [phone]  Physical Therapy Treatment  Patient Details  Name: MARQUELLE BALOW MRN: [id number] Date of Birth: 1991-01-01 Referring Provider (PT): Leafy Ro   Encounter Date: 10/24/2019   PT End of Session - 10/24/19 1601    Visit Number 14    Number of Visits 18    Date for PT Re-Evaluation 11/11/19   eval 06/23/19   PT Start Time 1500    PT Stop Time 1555    PT Time Calculation (min) 55 min    Activity Tolerance Patient tolerated treatment well           Past Medical History:  Diagnosis Date  . Asthma   . PCOS (polycystic ovarian syndrome)   . UTI (urinary tract infection)     Past Surgical History:  Procedure Laterality Date  . BREAST ENHANCEMENT SURGERY  2012  . SHOULDER SURGERY    . TONSILLECTOMY      There were no vitals filed for this visit.   Subjective Assessment - 10/24/19 1506    Subjective Pt reports her period has come 18 days late with the same level of intensity of cramps as prior to PT. Pt took a Midol today. The period is not heavy bleeding but is more clotted. Pt felt good after last session and could tell her L glut was tender. Pt went to hot yoga class the day later and she held off when there was something she felt was not helpful.    Pertinent History Two years ago, pt had a UTI that pt was hospitalized for because there was pus, blood. Pt recovered it after 7 days. Since menstrual cycles started at 29 years old, pt has had terrible cramps, severe clotting, headaches, irritability. Periods would occur irregularly. At one point, period did not occur for 6 months. Pt was on Yaz birth control pill but she has recently been off of it due to mother having a clotting gene.  Pt reports that at the time when she travelled on the cruise, pt un intentionally stopped taking her birth control pills with 1.5 weeks of pills left in  her pack because she left it on the ship. Her period came 3 days after the last time she took her pill. During this menstrual period is when she ended in the ED with the period like pelvic pain.  Surgeries: breast augmentation 2012, bicep muscle repair for rupture on R 2013, tonsilectomy    Patient Stated Goals less pelvic pain , less cramping, and more core strength              [facility] PT Assessment - 10/24/19 1508      Strength   Overall Strength Comments L hip ext 5/5, R hip 3+/5.  B hip abd 4+/5        Palpation   Spinal mobility B paraspinal tightness    Palpation comment no coccygeus tightness, L hamstring tightness, flinching with palpation at medial attachments, R hamstring no tightness present ( Hx of foot injury)                           [facility] Adult PT Treatment/Exercise - 10/24/19 1603      Neuro Re-ed    Neuro Re-ed Details  cued for stretches       Moist Heat Therapy   Number Minutes Moist Heat  5 Minutes    Moist Heat Location --   hamstring      Manual Therapy   Internal Pelvic Floor STM/MWM at B paraspinals, L hamstring                        PT Long Term Goals - 09/20/19 1707      PT LONG TERM GOAL #1   Title Pt will report decreased pelvic pain epsiodes from 2 weeks/ month to < 1 week / month in order to increased QOL    Time 8    Period Weeks    Status Achieved      PT LONG TERM GOAL #2   Title Pt will report decreased abdominal menstrual related cramping from 8/10 to < 4/10 in order demo self-management    Time 4    Period Weeks    Status Achieved      PT LONG TERM GOAL #3   Title Pt's strength from increase from  hip abd 3/5 B, hip ext 2+/5 L, 3/5 R with lumbar lordosis to hip abd/ ext 4+/5 B  in order minimize pain and walk for aerobic exercise  ( 08/04/19:  3+/5 R hip abd/ 4-/5 hip abd, hip ext R 3+/5, L 4-/5 )  (09/20/19:  hip ext 4-/5 L, 4+/5 R , hip abd 3+/5 L, 4-/5)    Time 8    Period Weeks    Status On-going       PT LONG TERM GOAL #4   Title Pt will demo equal iliac crest and no lumbar convex curve across 2 sessions in order to progress to deep core strengthening exercises    Time 2    Period Weeks    Status Achieved      PT LONG TERM GOAL #5   Title Pt will demo proper body mechanics to maintain SIJ / spinal alignment and to minimize straining pelvic floor in order to minimize relapse of Sx    Time 6    Period Weeks    Status Achieved      PT LONG TERM GOAL #6   Title Pt will report increased bowel regularity from every 3 days to daily bowel movements in order to promote GI health  ( 09/20/19: once to every other day)    Time 5    Period Weeks    Status Achieved      PT LONG TERM GOAL #7   Title Pt will demo increased score on FOTO with Urinary function 71 pts to > 90 pt, Constipation 55 pts to > 70 pts, and decreased score with Pain from 38 pts to < 28 pts in order to demo improved function and improve QOL  (08/04/19:  8 pts indicates better function for Pain)    Time 10    Period Weeks    Status Partially Met      PT LONG TERM GOAL #8   Title Pt will report no pain with sexual intercourse in order to improve QOL    Time 10    Period Weeks    Status On-going                 Plan - 10/24/19 1601    Clinical Impression Statement Pt tried a hot yoga class and practiced at her own pace, avoided postures if she felt it was helpful, and have no increased pain after the class. Pt has scheduled an appt with gynecologist because her POCS  Sx have returned again with cramping. Her menstrual period just started after being 18 days late.   Across the past 4 months prior to the return of these Sx, pt has had less pelvic pain, less cramps / pain with menstrual cycles. Pt has demo'd more equal alignment of pelvic girdle, stronger deep core/ back mm, less lower kinetic chain deficits ( genu valgus, medial collapse),  less hip mm/ pelvic floor tightness, and less spinal deviations.  Today, pt showed  increased hip abduction strength but still had R hip extension weakness. Pt no longer had coccygeus mm tightness but had L hamstring tightness and flinching pain with palpation at medial attachments.  Following manual Tx that addressed these deficits, pt showed increased mobility.   Plan to continue with skilled PT and communicate with gynecologist re: the return of her PCOS Sx.     Examination-Activity Limitations Continence;Other;Hygiene/Grooming    Stability/Clinical Decision Making Evolving/Moderate complexity    Rehab Potential Good    PT Frequency 1x / week    PT Duration Other (comment)   10   PT Treatment/Interventions Therapeutic activities;Functional mobility training;Moist Heat;Gait training;Therapeutic exercise;Balance training;Patient/family education;Neuromuscular re-education;Manual techniques;Manual lymph drainage;Scar mobilization    Consulted and Agree with Plan of Care Patient           Patient will benefit from skilled therapeutic intervention in order to improve the following deficits and impairments:  Decreased activity tolerance, Decreased coordination, Decreased knowledge of use of DME, Decreased safety awareness, Increased fascial restricitons, Impaired flexibility, Postural dysfunction, Improper body mechanics, Decreased range of motion, Decreased knowledge of precautions, Decreased scar mobility, Decreased endurance, Decreased balance, Decreased skin integrity, Decreased mobility, Hypomobility, Increased muscle spasms, Impaired sensation  Visit Diagnosis: Sacrococcygeal disorders, not elsewhere classified  Other lack of coordination  Other muscle spasm     Problem List Patient Active Problem List   Diagnosis Date Noted  . Prothrombin G20210A mutation ([place]) 07/05/2019  . Anxiety and depression 12/30/2017  . PCOS (polycystic ovarian syndrome) 12/30/2017  . Insomnia 12/30/2017  . Rash 09/18/2016  . History of UTI 06/17/2016  . Heel pain 01/15/2016     Jerl Mina ,PT, DPT, E-RYT  10/24/2019, 4:04 PM  [place] MAIN [facility] SERVICES [address] [place], [place], [address] Phone: [phone]   Fax:  [phone]  Name: HAILEY STORMER MRN: [id number] Date of Birth: 24-Feb-1991

## 2019-10-26 ENCOUNTER — Other Ambulatory Visit: Payer: Self-pay | Admitting: Family

## 2019-10-26 DIAGNOSIS — G4700 Insomnia, unspecified: Secondary | ICD-10-CM

## 2019-10-27 NOTE — Telephone Encounter (Signed)
PDMP reviewed.  Lunesta #30 with no refills - ok'd rx.

## 2019-11-01 ENCOUNTER — Other Ambulatory Visit: Payer: Self-pay

## 2019-11-01 ENCOUNTER — Ambulatory Visit: Payer: Managed Care, Other (non HMO) | Attending: Obstetrics and Gynecology | Admitting: Physical Therapy

## 2019-11-01 DIAGNOSIS — R29898 Other symptoms and signs involving the musculoskeletal system: Secondary | ICD-10-CM

## 2019-11-01 DIAGNOSIS — R278 Other lack of coordination: Secondary | ICD-10-CM

## 2019-11-01 DIAGNOSIS — M25572 Pain in left ankle and joints of left foot: Secondary | ICD-10-CM

## 2019-11-01 DIAGNOSIS — M533 Sacrococcygeal disorders, not elsewhere classified: Secondary | ICD-10-CM | POA: Diagnosis not present

## 2019-11-01 DIAGNOSIS — M62838 Other muscle spasm: Secondary | ICD-10-CM | POA: Diagnosis present

## 2019-11-01 DIAGNOSIS — M4125 Other idiopathic scoliosis, thoracolumbar region: Secondary | ICD-10-CM | POA: Diagnosis present

## 2019-11-01 NOTE — Therapy (Signed)
[place] MAIN [facility] SERVICES [address] [place], [place], [address] Phone: [phone]   Fax:  [phone]  Physical Therapy Treatment  Patient Details  Name: [name] MRN: [id number] Date of Birth: February 20, 1991 Referring Provider (PT): Leafy Ro   Encounter Date: 11/01/2019   PT End of Session - 11/01/19 1653    Visit Number 15    Number of Visits 18    Date for PT Re-Evaluation 11/11/19   eval 06/23/19   PT Start Time 2992    PT Stop Time 1700    PT Time Calculation (min) 47 min    Activity Tolerance Patient tolerated treatment well           Past Medical History:  Diagnosis Date  . Asthma   . PCOS (polycystic ovarian syndrome)   . UTI (urinary tract infection)     Past Surgical History:  Procedure Laterality Date  . BREAST ENHANCEMENT SURGERY  2012  . SHOULDER SURGERY    . TONSILLECTOMY      There were no vitals filed for this visit.   Subjective Assessment - 11/01/19 1515    Subjective Pt felt the hamstring tightness eased up after last Tx and that it helped ease off the cramps as well. Her cycle ended. Cramping was at a 7/10 and down to 4/10 after the session.    Pertinent History Two years ago, pt had a UTI that pt was hospitalized for because there was pus, blood. Pt recovered it after 7 days. Since menstrual cycles started at 29 years old, pt has had terrible cramps, severe clotting, headaches, irritability. Periods would occur irregularly. At one point, period did not occur for 6 months. Pt was on Yaz birth control pill but she has recently been off of it due to mother having a clotting gene.  Pt reports that at the time when she travelled on the cruise, pt un intentionally stopped taking her birth control pills with 1.5 weeks of pills left in her pack because she left it on the ship. Her period came 3 days after the last time she took her pill. During this menstrual period is when she ended in the ED with the period like  pelvic pain.  Surgeries: breast augmentation 2012, bicep muscle repair for rupture on R 2013, tonsilectomy    Patient Stated Goals less pelvic pain , less cramping, and more core strength              [facility] PT Assessment - 11/01/19 1516      Strength   Overall Strength Comments B hip ext 4+/5 ,       Palpation   Palpation comment hamstrings L,  medial borders of glut max along SIJ R, R coccygeus tightness                           [facility] Adult PT Treatment/Exercise - 11/01/19 1654      Manual Therapy   Internal Pelvic Floor STM/MWM at hamstrings L,  medial borders of glut max along SIJ R, R coccygeus / sacrococcygeus                         PT Long Term Goals - 09/20/19 1707      PT LONG TERM GOAL #1   Title Pt will report decreased pelvic pain epsiodes from 2 weeks/ month to < 1 week / month in order to  increased QOL    Time 8    Period Weeks    Status Achieved      PT LONG TERM GOAL #2   Title Pt will report decreased abdominal menstrual related cramping from 8/10 to < 4/10 in order demo self-management    Time 4    Period Weeks    Status Achieved      PT LONG TERM GOAL #3   Title Pt's strength from increase from  hip abd 3/5 B, hip ext 2+/5 L, 3/5 R with lumbar lordosis to hip abd/ ext 4+/5 B  in order minimize pain and walk for aerobic exercise  ( 08/04/19:  3+/5 R hip abd/ 4-/5 hip abd, hip ext R 3+/5, L 4-/5 )  (09/20/19:  hip ext 4-/5 L, 4+/5 R , hip abd 3+/5 L, 4-/5)    Time 8    Period Weeks    Status On-going      PT LONG TERM GOAL #4   Title Pt will demo equal iliac crest and no lumbar convex curve across 2 sessions in order to progress to deep core strengthening exercises    Time 2    Period Weeks    Status Achieved      PT LONG TERM GOAL #5   Title Pt will demo proper body mechanics to maintain SIJ / spinal alignment and to minimize straining pelvic floor in order to minimize relapse of Sx    Time 6    Period Weeks    Status  Achieved      PT LONG TERM GOAL #6   Title Pt will report increased bowel regularity from every 3 days to daily bowel movements in order to promote GI health  ( 09/20/19: once to every other day)    Time 5    Period Weeks    Status Achieved      PT LONG TERM GOAL #7   Title Pt will demo increased score on FOTO with Urinary function 71 pts to > 90 pt, Constipation 55 pts to > 70 pts, and decreased score with Pain from 38 pts to < 28 pts in order to demo improved function and improve QOL  (08/04/19:  8 pts indicates better function for Pain)    Time 10    Period Weeks    Status Partially Met      PT LONG TERM GOAL #8   Title Pt will report no pain with sexual intercourse in order to improve QOL    Time 10    Period Weeks    Status On-going                 Plan - 11/01/19 1654    Clinical Impression Statement Pt required manual Tx to minimize tightness at hamstrings L,  medial borders of glut max along SIJ R, R coccygeus / sacrococcygeus. Pt's hips extension strength on the R has increased. Pt's L hamstring tightness is diminishing. Pt reported her menstrual cramps decreased after last session as hamstring tightness decreased. Pt continues to benefit from skilled PT.    Examination-Activity Limitations Continence;Other;Hygiene/Grooming    Stability/Clinical Decision Making Evolving/Moderate complexity    Rehab Potential Good    PT Frequency 1x / week    PT Duration Other (comment)   10   PT Treatment/Interventions Therapeutic activities;Functional mobility training;Moist Heat;Gait training;Therapeutic exercise;Balance training;Patient/family education;Neuromuscular re-education;Manual techniques;Manual lymph drainage;Scar mobilization    Consulted and Agree with Plan of Care Patient  Patient will benefit from skilled therapeutic intervention in order to improve the following deficits and impairments:  Decreased activity tolerance, Decreased coordination, Decreased  knowledge of use of DME, Decreased safety awareness, Increased fascial restricitons, Impaired flexibility, Postural dysfunction, Improper body mechanics, Decreased range of motion, Decreased knowledge of precautions, Decreased scar mobility, Decreased endurance, Decreased balance, Decreased skin integrity, Decreased mobility, Hypomobility, Increased muscle spasms, Impaired sensation  Visit Diagnosis: Sacrococcygeal disorders, not elsewhere classified  Other lack of coordination  Other idiopathic scoliosis, thoracolumbar region  Other muscle spasm  Other symptoms and signs involving the musculoskeletal system  Acute left ankle pain     Problem List Patient Active Problem List   Diagnosis Date Noted  . Prothrombin G20210A mutation ([place]) 07/05/2019  . Anxiety and depression 12/30/2017  . PCOS (polycystic ovarian syndrome) 12/30/2017  . Insomnia 12/30/2017  . Rash 09/18/2016  . History of UTI 06/17/2016  . Heel pain 01/15/2016    Jerl Mina  ,PT, DPT, E-RYT  11/01/2019, 4:56 PM  [place] MAIN [facility] SERVICES [address] [place], [place], [address] Phone: [phone]   Fax:  [phone]  Name: DHANYA BOGLE MRN: [id number] Date of Birth: [birth year]

## 2019-11-18 ENCOUNTER — Other Ambulatory Visit: Payer: Self-pay | Admitting: Internal Medicine

## 2019-11-18 DIAGNOSIS — G4700 Insomnia, unspecified: Secondary | ICD-10-CM

## 2019-11-21 ENCOUNTER — Other Ambulatory Visit: Payer: Self-pay

## 2019-11-21 ENCOUNTER — Ambulatory Visit: Payer: Managed Care, Other (non HMO) | Admitting: Family

## 2019-11-21 ENCOUNTER — Encounter: Payer: Self-pay | Admitting: Family

## 2019-11-21 VITALS — BP 102/62 | HR 93 | Temp 98.7°F | Ht 67.01 in | Wt 132.2 lb

## 2019-11-21 DIAGNOSIS — E7800 Pure hypercholesterolemia, unspecified: Secondary | ICD-10-CM

## 2019-11-21 DIAGNOSIS — F419 Anxiety disorder, unspecified: Secondary | ICD-10-CM | POA: Diagnosis not present

## 2019-11-21 DIAGNOSIS — F329 Major depressive disorder, single episode, unspecified: Secondary | ICD-10-CM

## 2019-11-21 DIAGNOSIS — F32A Depression, unspecified: Secondary | ICD-10-CM

## 2019-11-21 NOTE — Assessment & Plan Note (Signed)
Overall low cardiovascular risk primarily due to age.  Advised patient we will check cholesterol twice a year to monitor trend closely.  Especially in setting of strong family risk factors

## 2019-11-21 NOTE — Progress Notes (Deleted)
[facility] MD/PA/NP OP Progress Note  11/21/2019 10:48 AM [name]  MRN:  [id number]  Chief Complaint:  HPI: *** Visit Diagnosis: No diagnosis found.  Past Psychiatric History: Please see initial evaluation for full details. I have reviewed the history. No updates at this time.     Past Medical History:  Past Medical History:  Diagnosis Date  . Asthma   . PCOS (polycystic ovarian syndrome)   . UTI (urinary tract infection)     Past Surgical History:  Procedure Laterality Date  . BREAST ENHANCEMENT SURGERY  2012  . SHOULDER SURGERY    . TONSILLECTOMY      Family Psychiatric History: Please see initial evaluation for full details. I have reviewed the history. No updates at this time.     Family History:  Family History  Problem Relation Age of Onset  . Arthritis Mother   . Heart disease Mother   . Stroke Mother   . Hypertension Mother   . Mental illness Mother   . Diabetes Mother   . Factor V Leiden deficiency Mother   . Sleep apnea Mother   . Depression Mother   . Drug abuse Mother   . Mental illness Father   . Diabetes Father   . CAD Father   . Sleep apnea Father   . Bleeding Disorder Father        prothrombin gene mutuation  . Bipolar disorder Father   . Crohn's disease Brother   . Kidney failure Paternal Grandfather   . Suicidality Maternal Grandfather   . Bipolar disorder Paternal Aunt   . Breast cancer Neg Hx   . Ovarian cancer Neg Hx   . Colon cancer Neg Hx     Social History:  Social History   Socioeconomic History  . Marital status: Married    Spouse name: Not on file  . Number of children: Not on file  . Years of education: Not on file  . Highest education level: Not on file  Occupational History  . Not on file  Tobacco Use  . Smoking status: Never Smoker  . Smokeless tobacco: Never Used  Vaping Use  . Vaping Use: Never used  Substance and Sexual Activity  . Alcohol use: Not Currently  . Drug use: No  . Sexual activity: Yes    Birth  control/protection: Pill, None  Other Topics Concern  . 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       Social Determinants of Health   Financial Resource Strain:   . Difficulty of Paying Living Expenses:   Food Insecurity:   . Worried About Programme researcher, broadcasting/film/video in the Last Year:   . Barista in the Last Year:   Transportation Needs:   . Freight forwarder (Medical):   Marland Kitchen Lack of Transportation (Non-Medical):   Physical Activity:   . Days of Exercise per Week:   . Minutes of Exercise per Session:   Stress:   . Feeling of Stress :   Social Connections:   . Frequency of Communication with Friends and Family:   . Frequency of Social Gatherings with Friends and Family:   . Attends Religious Services:   . Active Member of Clubs or Organizations:   . Attends Banker Meetings:   Marland Kitchen Marital Status:     Allergies:  Allergies  Allergen Reactions  . Aspirin Anaphylaxis  . Levaquin [Levofloxacin In D5w]  Hives    Metabolic Disorder Labs: Lab Results  Component Value Date   HGBA1C 5.1 06/23/2019   No results found for: PROLACTIN Lab Results  Component Value Date   CHOL 247 (H) 06/23/2019   TRIG 91.0 06/23/2019   HDL 65.90 06/23/2019   CHOLHDL 4 06/23/2019   VLDL 18.2 06/23/2019   LDLCALC 162 (H) 06/23/2019   Lab Results  Component Value Date   TSH 0.68 06/23/2019   TSH 1.22 08/20/2018    Therapeutic Level Labs: No results found for: LITHIUM No results found for: VALPROATE No components found for:  CBMZ  Current Medications: Current Outpatient Medications  Medication Sig Dispense Refill  . buPROPion (WELLBUTRIN XL) 300 MG 24 hr tablet TAKE 1 TABLET(150 MG) BY MOUTH DAILY 90 tablet 1  . Eszopiclone 3 MG TABS TAKE 1 TABLET BY MOUTH AT BEDTIME 30 tablet 1  . omeprazole (PRILOSEC) 20 MG capsule Take 20 mg by mouth daily.    . sertraline (ZOLOFT) 100 MG tablet Take 1 tablet (100 mg total) by  mouth at bedtime. 30 tablet 1   No current facility-administered medications for this visit.     Musculoskeletal: Strength & Muscle Tone: N/A Gait & Station: N/A Patient leans: N/A  Psychiatric Specialty Exam: Review of Systems  There were no vitals taken for this visit.There is no height or weight on file to calculate BMI.  General Appearance: {Appearance:22683}  Eye Contact:  {BHH EYE CONTACT:22684}  Speech:  Clear and Coherent  Volume:  Normal  Mood:  {BHH MOOD:22306}  Affect:  {Affect (PAA):22687}  Thought Process:  Coherent  Orientation:  Full (Time, Place, and Person)  Thought Content: Logical   Suicidal Thoughts:  {ST/HT (PAA):22692}  Homicidal Thoughts:  {ST/HT (PAA):22692}  Memory:  Immediate;   Good  Judgement:  {Judgement (PAA):22694}  Insight:  {Insight (PAA):22695}  Psychomotor Activity:  Normal  Concentration:  Concentration: Good and Attention Span: Good  Recall:  Good  Fund of Knowledge: Good  Language: Good  Akathisia:  No  Handed:  Right  AIMS (if indicated): not done  Assets:  Communication Skills Desire for Improvement  ADL's:  Intact  Cognition: WNL  Sleep:  {BHH GOOD/FAIR/POOR:22877}   Screenings: PHQ2-9     Office Visit from 08/22/2019 in [facility] Video Visit from 11/29/2018 in [facility] Office Visit from 09/23/2018 in [facility] Office Visit from 12/29/2017 in [facility]   PHQ-2 Total Score 0 0 0 0  PHQ-9 Total Score 8 0 0 --       Assessment and Plan:  [name] is a 29 y.o. year old female with a history of  depression, anxiety, PCOS, who presents for follow up appointment for below.   1. Panic disorder # GAD # r/o PTSD # r/o MDD  She reports symptoms of anxiety and panic attacks for several years, although it has been slightly improving since she has been on current medication regimen.  Psychosocial stressors includes conflict with her family,  trauma history as a teenager. Will uptitrate sertraline to target anxiety.  Will continue bupropion at this time as adjunctive treatment for depression.  Provided psychoeducation of using psychotropics in pregnancy, although it will be discussed again when she is pregnant. Noted that although she does have a family history of bipolar disorder, she denies any hypomanic episode except increased goal directed activity at night, which is more attributable to manic defense. Will continue to monitor.  She will greatly  benefit from CBT; will make a referral.   2. Insomnia, unspecified type She reports benefit from [facility] for insomnia.  We will continue current dose at this time.   Plan 1. Increase sertraline 100 mg daily 2. Continue Bupropion 300 mg daily 3. Continue Lunesta 3 mg at night as needed for sleep 4. Referral to therapy  5. Next appointment' 7/30 at 11:30 for 30 mins, video  The patient demonstrates the following risk factors for suicide: Chronic risk factors for suicide include: psychiatric disorder of anxiety and completed suicide in a family member. Acute risk factors for suicide include: family or marital conflict and loss (financial, interpersonal, professional). Protective factors for this patient include: positive social support, positive therapeutic relationship, responsibility to others (children, family), coping skills and hope for the future. Considering these factors, the overall suicide risk at this point appears to be low. Patient is appropriate for outpatient follow up.  [name], MD 11/21/2019, 10:48 AM

## 2019-11-21 NOTE — Progress Notes (Signed)
Subjective:    Patient ID: [name], female    DOB: 1991/02/21, 29 y.o.   MRN: [id number]  CC: [name] is a 29 y.o. female who presents today for follow up.   HPI: Feels well No new complaints Would like to know when to recheck cholesterol   depression- established with psychiatry. Feels improvement in depression over the last week.  Thinks zoloft 100mg  may be helpful.starts CBT this Thursday.  Continues to have night in which she has trouble staying asleep and thinking about what needs to be done, 'making to do lists. '   Weight is stable.       Established with Dr Thursday 10/15/19- panic disorder, insomnia. Zoloft increased. Referral to CBT HISTORY:  Past Medical History:  Diagnosis Date  . Asthma   . PCOS (polycystic ovarian syndrome)   . UTI (urinary tract infection)    Past Surgical History:  Procedure Laterality Date  . BREAST ENHANCEMENT SURGERY  2012  . SHOULDER SURGERY    . TONSILLECTOMY     Family History  Problem Relation Age of Onset  . Arthritis Mother   . Heart disease Mother   . Stroke Mother   . Hypertension Mother   . Mental illness Mother   . Diabetes Mother   . Factor V Leiden deficiency Mother   . Sleep apnea Mother   . Depression Mother   . Drug abuse Mother   . Mental illness Father   . Diabetes Father   . CAD Father   . Sleep apnea Father   . Bleeding Disorder Father        prothrombin gene mutuation  . Bipolar disorder Father   . Crohn's disease Brother   . Kidney failure Paternal Grandfather   . Suicidality Maternal Grandfather   . Bipolar disorder Paternal Aunt   . Clotting disorder Paternal Aunt   . Clotting disorder Paternal Grandmother   . Breast cancer Neg Hx   . Ovarian cancer Neg Hx   . Colon cancer Neg Hx     Allergies: Aspirin and Levaquin [levofloxacin in d5w] Current Outpatient Medications on File Prior to Visit  Medication Sig Dispense Refill  . buPROPion (WELLBUTRIN XL) 300 MG 24 hr tablet TAKE 1  TABLET(150 MG) BY MOUTH DAILY 90 tablet 1  . Eszopiclone 3 MG TABS TAKE 1 TABLET BY MOUTH AT BEDTIME 30 tablet 1  . norethindrone (MICRONOR) 0.35 MG tablet Take by mouth.    2013 omeprazole (PRILOSEC) 20 MG capsule Take 20 mg by mouth daily.    . sertraline (ZOLOFT) 100 MG tablet Take 1 tablet (100 mg total) by mouth at bedtime. 30 tablet 1  . spironolactone (ALDACTONE) 100 MG tablet Take by mouth.     No current facility-administered medications on file prior to visit.    Social History   Tobacco Use  . Smoking status: Never Smoker  . Smokeless tobacco: Never Used  Vaping Use  . Vaping Use: Never used  Substance Use Topics  . Alcohol use: Not Currently  . Drug use: No    Review of Systems  Constitutional: Negative for chills and fever.  Respiratory: Negative for cough.   Cardiovascular: Negative for chest pain and palpitations.  Gastrointestinal: Negative for nausea and vomiting.      Objective:    BP (!) 102/62 (BP Location: Left Arm, Patient Position: Sitting)   Pulse 93   Temp 98.7 F (37.1 C)   Ht 5' 7.01" (1.702 m)   Wt  132 lb 3.2 oz (60 kg)   SpO2 98%   BMI 20.70 kg/m  BP Readings from Last 3 Encounters:  11/21/19 (!) 102/62  08/22/19 102/64  07/04/19 126/84   Wt Readings from Last 3 Encounters:  11/21/19 132 lb 3.2 oz (60 kg)  08/22/19 128 lb 3.2 oz (58.2 kg)  07/04/19 127 lb 3.2 oz (57.7 kg)    Physical Exam Vitals reviewed.  Constitutional:      Appearance: She is well-developed.  Eyes:     Conjunctiva/sclera: Conjunctivae normal.  Cardiovascular:     Rate and Rhythm: Normal rate and regular rhythm.     Pulses: Normal pulses.     Heart sounds: Normal heart sounds.  Pulmonary:     Effort: Pulmonary effort is normal.     Breath sounds: Normal breath sounds. No wheezing, rhonchi or rales.  Skin:    General: Skin is warm and dry.  Neurological:     Mental Status: She is alert.  Psychiatric:        Speech: Speech normal.        Behavior:  Behavior normal.        Thought Content: Thought content normal.        Assessment & Plan:   Problem List Items Addressed This Visit      Other   Anxiety and depression    Pleased to hear some improved on increased dose Zoloft; she is starting CBT this week.  Established with psychiatry. I will continue to follow      Elevated cholesterol - Primary    Overall low cardiovascular risk primarily due to age.  Advised patient we will check cholesterol twice a year to monitor trend closely.  Especially in setting of strong family risk factors      Relevant Medications   spironolactone (ALDACTONE) 100 MG tablet   Other Relevant Orders   Lipid panel       I am having Anaria CBiagio Quint maintain her buPROPion, omeprazole, sertraline, Eszopiclone, norethindrone, and spironolactone.   No orders of the defined types were placed in this encounter.   Return precautions given.   Risks, benefits, and alternatives of the medications and treatment plan prescribed today were discussed, and patient expressed understanding.   Education regarding symptom management and diagnosis given to patient on AVS.  Continue to follow with [name], FNP for routine health maintenance.   [name] and I agreed with plan.   Rennie Plowman, FNP

## 2019-11-21 NOTE — Assessment & Plan Note (Signed)
Pleased to hear some improved on increased dose Zoloft; she is starting CBT this week.  Established with psychiatry. I will continue to follow

## 2019-11-21 NOTE — Patient Instructions (Addendum)
Cholesterol twice per year.   Nice to see you!

## 2019-11-22 ENCOUNTER — Ambulatory Visit: Payer: 59 | Admitting: Psychology

## 2019-11-22 ENCOUNTER — Ambulatory Visit: Payer: Managed Care, Other (non HMO) | Admitting: Physical Therapy

## 2019-11-22 DIAGNOSIS — M62838 Other muscle spasm: Secondary | ICD-10-CM

## 2019-11-22 DIAGNOSIS — M4125 Other idiopathic scoliosis, thoracolumbar region: Secondary | ICD-10-CM

## 2019-11-22 DIAGNOSIS — M533 Sacrococcygeal disorders, not elsewhere classified: Secondary | ICD-10-CM | POA: Diagnosis not present

## 2019-11-22 DIAGNOSIS — R29898 Other symptoms and signs involving the musculoskeletal system: Secondary | ICD-10-CM

## 2019-11-22 DIAGNOSIS — R278 Other lack of coordination: Secondary | ICD-10-CM

## 2019-11-22 NOTE — Patient Instructions (Signed)
Pelvic floor stretches,   strap stretches     L clams this week

## 2019-11-23 NOTE — Therapy (Signed)
[place] [facility] [address] [place], [place], [address] Phone: [phone]   Fax:  [phone]  Physical Therapy Treatment  Patient Details  Name: [name] MRN: [id number] Date of Birth: 1990/09/03 Referring Provider (PT): Leafy Ro   Encounter Date: 11/22/2019   PT End of Session - 11/22/19 1651    Visit Number 16    Number of Visits 18    Date for PT Re-Evaluation 11/11/19   eval 06/23/19   PT Start Time 1600    PT Stop Time 1659    PT Time Calculation (min) 59 min    Activity Tolerance Patient tolerated treatment well           Past Medical History:  Diagnosis Date  . Asthma   . PCOS (polycystic ovarian syndrome)   . UTI (urinary tract infection)     Past Surgical History:  Procedure Laterality Date  . BREAST ENHANCEMENT SURGERY  2012  . SHOULDER SURGERY    . TONSILLECTOMY      There were no vitals filed for this visit.   Subjective Assessment - 11/22/19 1607    Subjective Pt reports she noticed the SIJ pain and hip tightness increases before her cycle. Pt is expecting her cycle on Thurs/ Friday this week. Pt is still doing some stretches and squats and arm strengthening.    Pertinent History Two years ago, pt had a UTI that pt was hospitalized for because there was pus, blood. Pt recovered it after 7 days. Since menstrual cycles started at 29 years old, pt has had terrible cramps, severe clotting, headaches, irritability. Periods would occur irregularly. At one point, period did not occur for 6 months. Pt was on Yaz birth control pill but she has recently been off of it due to mother having a clotting gene.  Pt reports that at the time when she travelled on the cruise, pt un intentionally stopped taking her birth control pills with 1.5 weeks of pills left in her pack because she left it on the ship. Her period came 3 days after the last time she took her pill. During this menstrual period is when she ended in  the ED with the period like pelvic pain.  Surgeries: breast augmentation 2012, bicep muscle repair for rupture on R 2013, tonsilectomy    Patient Stated Goals less pelvic pain , less cramping, and more core strength              [facility] PT Assessment - 11/23/19 0825      Strength   Overall Strength Comments L hip 4-/5, B hip ext and R hip abd 5/5                       Pelvic Floor Special Questions - 11/23/19 7408    External Perineal Exam L Coccygeus/ obt int L, AP mob SIJ , anterior triangle mm B              [facility] Adult PT Treatment/Exercise - 11/23/19 0826      Exercises   Exercises --   reviewed HEP stretches      Moist Heat Therapy   Moist Heat Location --   perineum, sacrum, ab      Manual Therapy   Manual therapy comments L Coccygeus/ obt int L, AP mob SIJ , anterior triangle mm B  PT Long Term Goals - 11/22/19 1652      PT LONG TERM GOAL #1   Title Pt will report decreased pelvic pain epsiodes from 2 weeks/ month to < 1 week / month in order to increased QOL    Time 8    Period Weeks    Status Achieved      PT LONG TERM GOAL #2   Title Pt will report decreased abdominal menstrual related cramping from 8/10 to < 4/10 in order demo self-management    Time 4    Period Weeks    Status Achieved      PT LONG TERM GOAL #3   Title Pt's strength from increase from  hip abd 3/5 B, hip ext 2+/5 L, 3/5 R with lumbar lordosis to hip abd/ ext 4+/5 B  in order minimize pain and walk for aerobic exercise  ( 08/04/19:  3+/5 R hip abd/ 4-/5 hip abd, hip ext R 3+/5, L 4-/5 )  (09/20/19:  hip ext 4-/5 L, 4+/5 R , hip abd 3+/5 L, 4-/5)    Time 8    Period Weeks    Status Partially Met      PT LONG TERM GOAL #4   Title Pt will demo equal iliac crest and no lumbar convex curve across 2 sessions in order to progress to deep core strengthening exercises    Time 2    Period Weeks    Status Achieved      PT LONG TERM GOAL #5    Title Pt will demo proper body mechanics to maintain SIJ / spinal alignment and to minimize straining pelvic floor in order to minimize relapse of Sx    Time 6    Period Weeks    Status Achieved      PT LONG TERM GOAL #6   Title Pt will report increased bowel regularity from every 3 days to daily bowel movements in order to promote GI health  ( 09/20/19: once to every other day)    Time 5    Period Weeks    Status Achieved      PT LONG TERM GOAL #7   Title Pt will demo increased score on FOTO with Urinary function 71 pts to > 90 pt, Constipation 55 pts to > 70 pts, and decreased score with Pain from 38 pts to < 28 pts in order to demo improved function and improve QOL  (08/04/19:  8 pts indicates better function for Pain)    Time 10    Period Weeks    Status Partially Met      PT LONG TERM GOAL #8   Title Pt will report no pain with sexual intercourse in order to improve QOL ( 95% bettter)    Time 10    Period Weeks    Status Achieved                 Plan - 11/23/19 0827    Clinical Impression Statement Pt demo'd increased hip strength with compliance to doing squats and strengthening exercises but pt did not continue with pelvic floor stretches. This is likely contributing to pt's increased mm tensions at pelvic floor mm which is contributing to her c/o SIJ pain and hip tightness. Pt required review of stretches and explanation to what muscles the stretches target. Pt demo'd IND w/ HEP.  Pt continues to benefit from skilled PT    Examination-Activity Limitations Continence;Other;Hygiene/Grooming    Stability/Clinical Decision Making Evolving/Moderate complexity      Rehab Potential Good    PT Frequency 1x / week    PT Duration Other (comment)   10   PT Treatment/Interventions Therapeutic activities;Functional mobility training;Moist Heat;Gait training;Therapeutic exercise;Balance training;Patient/family education;Neuromuscular re-education;Manual techniques;Manual lymph  drainage;Scar mobilization    Consulted and Agree with Plan of Care Patient           Patient will benefit from skilled therapeutic intervention in order to improve the following deficits and impairments:  Decreased activity tolerance, Decreased coordination, Decreased knowledge of use of DME, Decreased safety awareness, Increased fascial restricitons, Impaired flexibility, Postural dysfunction, Improper body mechanics, Decreased range of motion, Decreased knowledge of precautions, Decreased scar mobility, Decreased endurance, Decreased balance, Decreased skin integrity, Decreased mobility, Hypomobility, Increased muscle spasms, Impaired sensation  Visit Diagnosis: Sacrococcygeal disorders, not elsewhere classified  Other lack of coordination  Other muscle spasm  Other idiopathic scoliosis, thoracolumbar region  Other symptoms and signs involving the musculoskeletal system     Problem List Patient Active Problem List   Diagnosis Date Noted  . Elevated cholesterol 11/21/2019  . Prothrombin G20210A mutation (HCC) 07/05/2019  . Anxiety and depression 12/30/2017  . PCOS (polycystic ovarian syndrome) 12/30/2017  . Insomnia 12/30/2017  . Rash 09/18/2016  . History of UTI 06/17/2016  . Heel pain 01/15/2016    Yeung,Shin Yiing ,PT, DPT, E-RYT  11/23/2019, 8:27 AM  [facility]  [facility] [address] [place], [place], [address] Phone: [phone]   Fax:  [phone]  Name: [name] MRN: [id number] Date of Birth: 10/02/1990   

## 2019-11-24 ENCOUNTER — Ambulatory Visit (INDEPENDENT_AMBULATORY_CARE_PROVIDER_SITE_OTHER): Payer: 59 | Admitting: Psychology

## 2019-11-24 DIAGNOSIS — F411 Generalized anxiety disorder: Secondary | ICD-10-CM | POA: Diagnosis not present

## 2019-11-25 ENCOUNTER — Telehealth (HOSPITAL_COMMUNITY): Payer: 59 | Admitting: Psychiatry

## 2019-11-25 ENCOUNTER — Telehealth (HOSPITAL_COMMUNITY): Payer: Self-pay | Admitting: Psychiatry

## 2019-11-25 ENCOUNTER — Other Ambulatory Visit: Payer: Self-pay

## 2019-11-25 NOTE — Telephone Encounter (Signed)
Sent link for video visit through Epic. Patient did not sign in. Called the patient  for appointment scheduled today. The patient did not answer the phone. Left voice message to contact the office.  

## 2019-12-02 ENCOUNTER — Telehealth: Payer: Self-pay | Admitting: Family

## 2019-12-02 NOTE — Telephone Encounter (Signed)
close

## 2019-12-08 ENCOUNTER — Ambulatory Visit: Payer: Managed Care, Other (non HMO) | Admitting: Physical Therapy

## 2019-12-13 ENCOUNTER — Ambulatory Visit: Payer: Managed Care, Other (non HMO) | Admitting: Physical Therapy

## 2019-12-14 NOTE — Progress Notes (Signed)
Virtual Visit via Video Note  I connected with [name] on 12/19/19 at 12:00 PM EDT by a video enabled telemedicine application and verified that I am speaking with the correct person using two identifiers.   I discussed the limitations of evaluation and management by telemedicine and the availability of in person appointments. The patient expressed understanding and agreed to proceed.    I discussed the assessment and treatment plan with the patient. The patient was provided an opportunity to ask questions and all were answered. The patient agreed with the plan and demonstrated an understanding of the instructions.   The patient was advised to call back or seek an in-person evaluation if the symptoms worsen or if the condition fails to improve as anticipated.  Location: patient- home, provider- office   I provided 18 minutes of non-face-to-face time during this encounter.   Melanie Hottereina [name], MD    [facility] OP Progress Note  12/19/2019 1:00 PM Melanie Barnsmber C Teachey  MRN:  [id number]  Chief Complaint:  Chief Complaint    Anxiety; Depression; Follow-up     HPI:  This is a follow-up appointment for depression and anxiety.  She states that she has had worsening in depression, although her anxiety is better.  She goes to work, and returns home; she is unsure what is to look forward to. Although she denies SI and is against SI, referring to her step father who committed suicide, she wishes to stop and rest.  She states that she has been very stressed at work and home.  She feels constant pressure at work, and also reports that her coworker even mention that the work environment is tense.  She also talks about her father, who refused to take insulin, although she begged. She feels that she has poured energy in to everyone except to her self. Her husband struggles with depression, and she has to handle house chores most of the time.  She also states that it is my completing it was technology,  stating that her husband is constantly on the phone or computer.  She hopes to have more quality time with him.  Although they have discussed about this several times, they are usually exhausted on weekend, spending time on their own.  She has middle insomnia, which she attributes to her significant anxiety.  She feels exhausted, and has anhedonia. She has difficulty in concentration.  She has fair appetite; she has not had any weight loss since the last visit (had weight loss 40 pounds over 9 months; followed by her PCP).  She feels anxious and tense.  She has less panic attacks.   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 Panic disorder  F41.0 sertraline (ZOLOFT) 100 MG tablet  2. GAD (generalized anxiety disorder)  F41.1   3. Mild episode of recurrent major depressive disorder (HCC)  F33.0   4. Insomnia, unspecified type  G47.00 sertraline (ZOLOFT) 100 MG tablet    Past Psychiatric History: Please see initial evaluation for full details. I have reviewed the history.  No updates at this time.     Past Medical History:  Past Medical History:  Diagnosis Date  . Asthma   . PCOS (polycystic ovarian syndrome)   . UTI (urinary tract infection)     Past Surgical History:  Procedure Laterality Date  . BREAST ENHANCEMENT SURGERY  2012  . SHOULDER SURGERY    . TONSILLECTOMY      Family Psychiatric History: Please see initial evaluation for full details. I have reviewed the history. No updates at this time.     Family History:  Family History  Problem Relation Age of Onset  .  Arthritis Mother   . Heart disease Mother   . Stroke Mother   . Hypertension Mother   . Mental illness Mother   . Diabetes Mother   . Factor V Leiden deficiency Mother   . Sleep apnea Mother   . Depression Mother   . Drug abuse Mother   . Mental illness Father   . Diabetes Father   . CAD Father   . Sleep apnea Father   . Bleeding Disorder Father        prothrombin gene mutuation  . Bipolar disorder Father   . Crohn's disease Brother   . Kidney failure Paternal Grandfather   . Suicidality Maternal Grandfather   . Bipolar disorder Paternal Aunt   . Clotting disorder Paternal Aunt   . Clotting disorder Paternal Grandmother   . Breast cancer Neg Hx   . Ovarian cancer Neg Hx   . Colon cancer Neg Hx     Social History:  Social History   Socioeconomic History  . Marital status: Married    Spouse name: Not on file  . Number of children: Not on file  . Years of education: Not on file  . Highest education level: Not on file  Occupational History  . Not on file  Tobacco Use  . Smoking status: Never Smoker  . Smokeless tobacco: Never Used  Vaping Use  . Vaping Use: Never used  Substance and Sexual Activity  . Alcohol use: Not Currently  . Drug use: No  . Sexual activity: Yes    Birth control/protection: Pill, None  Other Topics Concern  . 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       Social Determinants of Health   Financial Resource Strain:   . Difficulty of Paying Living Expenses: Not on file  Food Insecurity:   . Worried About Programme researcher, broadcasting/film/video in the Last Year: Not on file  . Ran Out of Food in the Last Year: Not on file  Transportation Needs:   . Lack of Transportation (Medical): Not on file  . Lack of Transportation (Non-Medical): Not on file  Physical Activity:   . Days of Exercise per Week: Not on file  . Minutes of Exercise per Session: Not on file  Stress:   . Feeling of Stress : Not on file   Social Connections:   . Frequency of Communication with Friends and Family: Not on file  . Frequency of Social Gatherings with Friends and Family: Not on file  . Attends Religious Services: Not on file  . Active Member of Clubs or Organizations: Not on file  . Attends Banker Meetings: Not on file  . Marital Status: Not on file    Allergies:  Allergies  Allergen Reactions  . Aspirin Anaphylaxis  . Levaquin [Levofloxacin In D5w]  Hives    Metabolic Disorder Labs: Lab Results  Component Value Date   HGBA1C 5.1 06/23/2019   No results found for: PROLACTIN Lab Results  Component Value Date   CHOL 247 (H) 06/23/2019   TRIG 91.0 06/23/2019   HDL 65.90 06/23/2019   CHOLHDL 4 06/23/2019   VLDL 18.2 06/23/2019   LDLCALC 162 (H) 06/23/2019   Lab Results  Component Value Date   TSH 0.68 06/23/2019   TSH 1.22 08/20/2018    Therapeutic Level Labs: No results found for: LITHIUM No results found for: VALPROATE No components found for:  CBMZ  Current Medications: Current Outpatient Medications  Medication Sig Dispense Refill  . buPROPion (WELLBUTRIN XL) 150 MG 24 hr tablet 450 mg daily. Take along with 300 mg tab 30 tablet 1  . buPROPion (WELLBUTRIN XL) 300 MG 24 hr tablet TAKE 1 TABLET(150 MG) BY MOUTH DAILY 90 tablet 1  . buPROPion (WELLBUTRIN XL) 300 MG 24 hr tablet 450 mg daily. Take along with 150 mg tab 90 tablet 0  . Eszopiclone 3 MG TABS TAKE 1 TABLET BY MOUTH AT BEDTIME 30 tablet 1  . norethindrone (MICRONOR) 0.35 MG tablet Take by mouth.    Marland Kitchen omeprazole (PRILOSEC) 20 MG capsule Take 20 mg by mouth daily.    . sertraline (ZOLOFT) 100 MG tablet Take 1 tablet (100 mg total) by mouth at bedtime. 30 tablet 1  . spironolactone (ALDACTONE) 100 MG tablet Take by mouth.     No current facility-administered medications for this visit.     Musculoskeletal: Strength & Muscle Tone: N/A Gait & Station: N/A Patient leans: N/A  Psychiatric Specialty  Exam: Review of Systems  Psychiatric/Behavioral: Positive for decreased concentration, dysphoric mood and sleep disturbance. Negative for agitation, behavioral problems, confusion, hallucinations, self-injury and suicidal ideas. The patient is nervous/anxious. The patient is not hyperactive.   All other systems reviewed and are negative.   There were no vitals taken for this visit.There is no height or weight on file to calculate BMI.  General Appearance: Fairly Groomed  Eye Contact:  Good  Speech:  Clear and Coherent  Volume:  Normal  Mood:  Depressed  Affect:  Appropriate, Congruent and Restricted  Thought Process:  Coherent  Orientation:  Full (Time, Place, and Person)  Thought Content: Logical   Suicidal Thoughts:  No  Homicidal Thoughts:  No  Memory:  Immediate;   Good  Judgement:  Good  Insight:  Good  Psychomotor Activity:  Normal  Concentration:  Concentration: Good and Attention Span: Good  Recall:  Good  Fund of Knowledge: Good  Language: Good  Akathisia:  No  Handed:  Right  AIMS (if indicated): not done  Assets:  Communication Skills Desire for Improvement  ADL's:  Intact  Cognition: WNL  Sleep:  Good   Screenings: GAD-7     Office Visit from 11/21/2019 in [facility]  Total GAD-7 Score 19    PHQ2-9     Office Visit from 11/21/2019 in [facility] Office Visit from 08/22/2019 in [facility] Video Visit from 11/29/2018 in [facility] Office Visit from 09/23/2018 in [facility] [place] Office Visit from 12/29/2017 in [facility]   PHQ-2 Total Score 2 0 0 0 0  PHQ-9 Total Score 16 8 0 0 --       Assessment and Plan:  IKRAM RIEBE is a 29 y.o. year old female with a history of depression, anxiety, PCOS ,  who presents for follow up appointment for below.    1. Panic disorder 2. GAD (generalized anxiety disorder) 3. Mild episode of recurrent major  depressive disorder (HCC) R/o PTSD Although she reports some improvement in anxiety since up titration of sertraline, she has had worsening in depressive symptoms since the last visit.  Psychosocial stressors includes work , conflict with her family, and trauma history as a teenager.  Will uptitrate bupropion to optimize treatment for depression.  Discussed potential risk of headache, worsening in anxiety and mood symptoms.  Will continue sertraline to target anxiety and depression. Noted that although she does have a family history of bipolar disorder, she denies any hypomanic episode except increased goal directed activity at night, which is more attributable to manic defense. Will continue to monitor.    She will greatly benefit from CBT; she has an upcoming appointment with a therapist.   2. Insomnia, unspecified type She has middle insomnia, which she mainly attributes to anxiety.  We will continue current dose of Lunesta to target insomnia.  She reports benefit from [facility] for insomnia.  We will continue current dose at this time.   Plan 1. Increase Bupropion 300 mg daily 2. Continue sertraline 100 mg daily -on Lunesta 3 mg at night as needed for sleep 4. Next appointment: 10/4 at 4 PM for 30 mins, video  The patient demonstrates the following risk factors for suicide: Chronic risk factors for suicide include: psychiatric disorder of anxiety and completed suicide in a family member. Acute risk factors for suicide include: family or marital conflict and loss (financial, interpersonal, professional). Protective factors for this patient include: positive social support, positive therapeutic relationship, responsibility to others (children, family), coping skills and hope for the future. Considering these factors, the overall suicide risk at this point appears to be low. Patient is appropriate for outpatient follow up.   Melanie Hotter, MD 12/19/2019, 1:00 PM

## 2019-12-19 ENCOUNTER — Encounter (HOSPITAL_COMMUNITY): Payer: Self-pay | Admitting: Psychiatry

## 2019-12-19 ENCOUNTER — Telehealth (INDEPENDENT_AMBULATORY_CARE_PROVIDER_SITE_OTHER): Payer: 59 | Admitting: Psychiatry

## 2019-12-19 ENCOUNTER — Other Ambulatory Visit: Payer: Self-pay

## 2019-12-19 DIAGNOSIS — F411 Generalized anxiety disorder: Secondary | ICD-10-CM

## 2019-12-19 DIAGNOSIS — G4700 Insomnia, unspecified: Secondary | ICD-10-CM | POA: Diagnosis not present

## 2019-12-19 DIAGNOSIS — F410 Panic disorder [episodic paroxysmal anxiety] without agoraphobia: Secondary | ICD-10-CM

## 2019-12-19 DIAGNOSIS — F330 Major depressive disorder, recurrent, mild: Secondary | ICD-10-CM | POA: Diagnosis not present

## 2019-12-19 MED ORDER — BUPROPION HCL ER (XL) 150 MG PO TB24: ORAL_TABLET | ORAL | 1 refills | Status: DC

## 2019-12-19 MED ORDER — SERTRALINE HCL 100 MG PO TABS: 100.0000 mg | ORAL_TABLET | Freq: Every day | ORAL | 1 refills | Status: DC

## 2019-12-19 MED ORDER — BUPROPION HCL ER (XL) 300 MG PO TB24: ORAL_TABLET | ORAL | 0 refills | Status: DC

## 2019-12-19 NOTE — Patient Instructions (Signed)
1. Increase Bupropion 300 mg daily 2. Conitnue sertraline 100 mg daily 3. Next appointment: 10/4 at 4 PM

## 2019-12-20 ENCOUNTER — Ambulatory Visit: Payer: Managed Care, Other (non HMO) | Attending: Obstetrics and Gynecology | Admitting: Physical Therapy

## 2019-12-20 ENCOUNTER — Other Ambulatory Visit: Payer: Self-pay

## 2019-12-20 DIAGNOSIS — R278 Other lack of coordination: Secondary | ICD-10-CM | POA: Diagnosis present

## 2019-12-20 DIAGNOSIS — M62838 Other muscle spasm: Secondary | ICD-10-CM | POA: Diagnosis present

## 2019-12-20 DIAGNOSIS — R29898 Other symptoms and signs involving the musculoskeletal system: Secondary | ICD-10-CM

## 2019-12-20 DIAGNOSIS — M533 Sacrococcygeal disorders, not elsewhere classified: Secondary | ICD-10-CM | POA: Diagnosis not present

## 2019-12-20 DIAGNOSIS — M25572 Pain in left ankle and joints of left foot: Secondary | ICD-10-CM | POA: Diagnosis present

## 2019-12-20 DIAGNOSIS — M4125 Other idiopathic scoliosis, thoracolumbar region: Secondary | ICD-10-CM

## 2019-12-20 NOTE — Patient Instructions (Signed)
Emailed pain science resources   Keep up with strengthening  L heel raises to minimize ankle sprains   Keep up stretches and relaxation

## 2019-12-20 NOTE — Therapy (Addendum)
[facility] MAIN [facility] SERVICES [address] [place], [place], [address] Phone: [phone]   Fax:  [phone]  Physical Therapy Treatment/ Progress Note   Patient Details  Name: [name] MRN: [id number] Date of Birth: Jan 06, 1991 Referring Provider (PT): Dalbert Garnet   Encounter Date: 12/20/2019   PT End of Session - 12/20/19 1532    Visit Number 17    Date for PT Re-Evaluation 03/13/20   eval 06/23/19   PT Start Time 1455    PT Stop Time 1600    PT Time Calculation (min) 65 min    Activity Tolerance Patient tolerated treatment well           Past Medical History:  Diagnosis Date  . Asthma   . PCOS (polycystic ovarian syndrome)   . UTI (urinary tract infection)     Past Surgical History:  Procedure Laterality Date  . BREAST ENHANCEMENT SURGERY  2012  . SHOULDER SURGERY    . TONSILLECTOMY      There were no vitals filed for this visit.   Subjective Assessment - 12/20/19 1500    Subjective Pt has been stretching and using it to help her to managing pain. Pt was given a compound cream and tried to relax and focused on something besides pain ( i.e reading).    Pertinent History Two years ago, pt had a UTI that pt was hospitalized for because there was pus, blood. Pt recovered it after 7 days. Since menstrual cycles started at 29 years old, pt has had terrible cramps, severe clotting, headaches, irritability. Periods would occur irregularly. At one point, period did not occur for 6 months. Pt was on Yaz birth control pill but she has recently been off of it due to mother having a clotting gene.  Pt reports that at the time when she travelled on the cruise, pt un intentionally stopped taking her birth control pills with 1.5 weeks of pills left in her pack because she left it on the ship. Her period came 3 days after the last time she took her pill. During this menstrual period is when she ended in the ED with the period like pelvic pain.   Surgeries: breast augmentation 2012, bicep muscle repair for rupture on R 2013, tonsilectomy    Patient Stated Goals less pelvic pain , less cramping, and more core strength              [facility] PT Assessment - 12/20/19 1544      Strength   Overall Strength Comments PF 4/5 20 reps , 30 reps with single  UE on wall       Palpation   SI assessment  equal iliac crest, SIJ mobility                       Pelvic Floor Special Questions - 12/20/19 1553    External Palpation no tightness/ tenderness at pelvic floor              [facility] Adult PT Treatment/Exercise - 12/20/19 1550      Therapeutic Activites    Other Therapeutic Activities education on pain science re-education, discussed HEP for strengthening,  reassessed goals                         PT Long Term Goals - 12/20/19 1533      PT LONG TERM GOAL #1   Title Pt will report decreased  pelvic pain epsiodes from 2 weeks/ month to < 1 week / month in order to increased QOL    Time 8    Period Weeks    Status Achieved      PT LONG TERM GOAL #2   Title Pt will report decreased abdominal menstrual related cramping from 8/10 to < 4/10 in order demo self-management    Time 4    Period Weeks    Status Achieved      PT LONG TERM GOAL #3   Title Pt's strength from increase from  hip abd 3/5 B, hip ext 2+/5 L, 3/5 R with lumbar lordosis to hip abd/ ext 4+/5 B  in order minimize pain and walk for aerobic exercise  ( 08/04/19:  3+/5 R hip abd/ 4-/5 hip abd, hip ext R 3+/5, L 4-/5 )  (09/20/19:  hip ext 4-/5 L, 4+/5 R , hip abd 3+/5 L, 4-/5, 8/24:  hip abd/ext 4+/5 )    Time 8    Period Weeks    Status Achieved      PT LONG TERM GOAL #4   Title Pt will demo equal iliac crest and no lumbar convex curve across 2 sessions in order to progress to deep core strengthening exercises    Time 2    Period Weeks    Status Achieved      PT LONG TERM GOAL #5   Title Pt will demo proper body mechanics to maintain SIJ /  spinal alignment and to minimize straining pelvic floor in order to minimize relapse of Sx    Time 6    Period Weeks    Status Achieved      Additional Long Term Goals   Additional Long Term Goals Yes      PT LONG TERM GOAL #6   Title Pt will report increased bowel regularity from every 3 days to daily bowel movements in order to promote GI health  ( 09/20/19: once to every other day)    Time 5    Period Weeks    Status Achieved      PT LONG TERM GOAL #7   Title Pt will demo increased score on FOTO with Urinary function 71 pts to > 90 pt, Constipation 55 pts to > 70 pts, and decreased score with Pain from 38 pts to < 28 pts in order to demo improved function and improve QOL  (08/04/19:  8 pts indicates better function for Pain,  8/24:  Pain 0pts, Urinary 98 pts, Bowels 81 pts  )    Time 10    Period Weeks    Status Achieved      PT LONG TERM GOAL #8   Title Pt will report no pain with sexual intercourse in order to improve QOL ( 95% bettter)    Time 10    Period Weeks    Status Achieved      PT LONG TERM GOAL  #9   TITLE Pt will be IND with pain science re-edu strategies ( Curable app and other resources) in order to return to ADL and improve QOL    Time 12    Period Weeks    Status New    Target Date 01/10/20                 Plan - 12/20/19 1645    Clinical Impression Statement Pt has achieved 9/10 goals. Pt has shown significantly increased pelvic / hip/ SIJ mobility. Pt also showed improved  upright posture, less genu valgus, pronation, hip strength which will help minimize relapse of overactivity of pelvic floor mm. Pt has been able to have less pain with sexual intercourse and menstrual cycles. Pt was educated on fitness and stretching routine for maintainence in flexibility and strength. Provided neuroscience re-education to further yield long lasting changes to pain management. Educated the importance of continued strengthening for lower kinetic chain and balance and  ankle given pt's Hx of ankle sprains.   Plan to give pt more time for self management before d/c.  Pt benefit from skilled PT.    Examination-Activity Limitations Continence;Other;Hygiene/Grooming    Stability/Clinical Decision Making Evolving/Moderate complexity    Rehab Potential Good    PT Frequency 1x / month    PT Duration Other (comment)   12 weeks   PT Treatment/Interventions Therapeutic activities;Functional mobility training;Moist Heat;Gait training;Therapeutic exercise;Balance training;Patient/family education;Neuromuscular re-education;Manual techniques;Manual lymph drainage;Scar mobilization    Consulted and Agree with Plan of Care Patient           Patient will benefit from skilled therapeutic intervention in order to improve the following deficits and impairments:  Decreased activity tolerance, Decreased coordination, Decreased knowledge of use of DME, Decreased safety awareness, Increased fascial restricitons, Impaired flexibility, Postural dysfunction, Improper body mechanics, Decreased range of motion, Decreased knowledge of precautions, Decreased scar mobility, Decreased endurance, Decreased balance, Decreased skin integrity, Decreased mobility, Hypomobility, Increased muscle spasms, Impaired sensation  Visit Diagnosis: Sacrococcygeal disorders, not elsewhere classified  Other lack of coordination  Other muscle spasm  Other idiopathic scoliosis, thoracolumbar region  Other symptoms and signs involving the musculoskeletal system  Acute left ankle pain     Problem List Patient Active Problem List   Diagnosis Date Noted  . Elevated cholesterol 11/21/2019  . Prothrombin G20210A mutation (HCC) 07/05/2019  . Anxiety and depression 12/30/2017  . PCOS (polycystic ovarian syndrome) 12/30/2017  . Insomnia 12/30/2017  . Rash 09/18/2016  . History of UTI 06/17/2016  . Heel pain 01/15/2016    Mariane Masters ,PT, DPT, E-RYT  12/20/2019, 4:46 PM  [facility] MAIN [facility] SERVICES [address] [place], [place], [address] Phone: [phone]   Fax:  [phone]  Name: YAFFA SECKMAN MRN: [id number] Date of Birth: 10/07/1990

## 2019-12-21 ENCOUNTER — Ambulatory Visit (INDEPENDENT_AMBULATORY_CARE_PROVIDER_SITE_OTHER): Payer: 59 | Admitting: Psychology

## 2019-12-21 DIAGNOSIS — F411 Generalized anxiety disorder: Secondary | ICD-10-CM

## 2019-12-21 NOTE — Addendum Note (Signed)
Addended by: Mariane Masters on: 12/21/2019 12:15 PM   Modules accepted: Orders

## 2019-12-28 ENCOUNTER — Ambulatory Visit (INDEPENDENT_AMBULATORY_CARE_PROVIDER_SITE_OTHER): Payer: 59 | Admitting: Psychology

## 2019-12-28 DIAGNOSIS — F411 Generalized anxiety disorder: Secondary | ICD-10-CM

## 2020-01-04 ENCOUNTER — Other Ambulatory Visit: Payer: Self-pay

## 2020-01-04 ENCOUNTER — Other Ambulatory Visit (INDEPENDENT_AMBULATORY_CARE_PROVIDER_SITE_OTHER): Payer: Managed Care, Other (non HMO)

## 2020-01-04 DIAGNOSIS — E7800 Pure hypercholesterolemia, unspecified: Secondary | ICD-10-CM

## 2020-01-04 LAB — LIPID PANEL
Cholesterol: 230 mg/dL — ABNORMAL HIGH (ref 0–200)
HDL: 63.3 mg/dL (ref >39.00)
LDL Cholesterol: 153 mg/dL — ABNORMAL HIGH (ref 0–99)
NonHDL: 166.32
Total CHOL/HDL Ratio: 4
Triglycerides: 66 mg/dL (ref 0.0–149.0)
VLDL: 13.2 mg/dL (ref 0.0–40.0)

## 2020-01-05 ENCOUNTER — Ambulatory Visit: Payer: Managed Care, Other (non HMO) | Admitting: Physical Therapy

## 2020-01-05 ENCOUNTER — Encounter: Payer: Self-pay | Admitting: Physical Therapy

## 2020-01-05 DIAGNOSIS — R29898 Other symptoms and signs involving the musculoskeletal system: Secondary | ICD-10-CM

## 2020-01-05 DIAGNOSIS — M533 Sacrococcygeal disorders, not elsewhere classified: Secondary | ICD-10-CM

## 2020-01-05 DIAGNOSIS — M62838 Other muscle spasm: Secondary | ICD-10-CM

## 2020-01-05 DIAGNOSIS — R278 Other lack of coordination: Secondary | ICD-10-CM

## 2020-01-05 DIAGNOSIS — M4125 Other idiopathic scoliosis, thoracolumbar region: Secondary | ICD-10-CM

## 2020-01-05 DIAGNOSIS — M25572 Pain in left ankle and joints of left foot: Secondary | ICD-10-CM

## 2020-01-05 NOTE — Therapy (Signed)
[facility] MAIN [facility] SERVICES [address] [place], [place], [address] Phone: [phone]   Fax:  [phone]  Patient Details  Name: [name] MRN: [id number] Date of Birth: 07-03-1990 Referring Provider: Dalbert Garnet  Encounter Date: 01/05/2020  Discharge Summary   Pt has achieved 9/10 goals across 9 months and 11 visits. Pt has shown significantly increased pelvic / hip/ SIJ mobility. Pt also showed improved upright posture, less genu valgus, pronation, hip strength which will help minimize relapse of overactivity of pelvic floor mm.   Pt has been able to have less pain with sexual intercourse and menstrual cycles. Pt was educated on fitness and stretching routine for maintainence in flexibility and strength.   Provided neuroscience re-education to further yield long lasting changes to pain management. Educated the importance of continued strengthening for lower kinetic chain and balance and ankle given pt's Hx of ankle sprains.   Pt is ready for d/c at this time.    PT Long Term Goals - 12/20/19 1533      PT LONG TERM GOAL #1   Title Pt will report decreased pelvic pain epsiodes from 2 weeks/ month to < 1 week / month in order to increased QOL    Time 8    Period Weeks    Status Achieved      PT LONG TERM GOAL #2   Title Pt will report decreased abdominal menstrual related cramping from 8/10 to < 4/10 in order demo self-management    Time 4    Period Weeks    Status Achieved      PT LONG TERM GOAL #3   Title Pt's strength from increase from  hip abd 3/5 B, hip ext 2+/5 L, 3/5 R with lumbar lordosis to hip abd/ ext 4+/5 B  in order minimize pain and walk for aerobic exercise  ( 08/04/19:  3+/5 R hip abd/ 4-/5 hip abd, hip ext R 3+/5, L 4-/5 )  (09/20/19:  hip ext 4-/5 L, 4+/5 R , hip abd 3+/5 L, 4-/5, 8/24:  hip abd/ext 4+/5 )    Time 8    Period Weeks    Status Achieved      PT LONG TERM GOAL #4   Title Pt will demo equal iliac crest and  no lumbar convex curve across 2 sessions in order to progress to deep core strengthening exercises    Time 2    Period Weeks    Status Achieved      PT LONG TERM GOAL #5   Title Pt will demo proper body mechanics to maintain SIJ / spinal alignment and to minimize straining pelvic floor in order to minimize relapse of Sx    Time 6    Period Weeks    Status Achieved      Additional Long Term Goals   Additional Long Term Goals Yes      PT LONG TERM GOAL #6   Title Pt will report increased bowel regularity from every 3 days to daily bowel movements in order to promote GI health  ( 09/20/19: once to every other day)    Time 5    Period Weeks    Status Achieved      PT LONG TERM GOAL #7   Title Pt will demo increased score on FOTO with Urinary function 71 pts to > 90 pt, Constipation 55 pts to > 70 pts, and decreased score with Pain from 38 pts to < 28 pts in  order to demo improved function and improve QOL  (08/04/19:  8 pts indicates better function for Pain,  8/24:  Pain 0pts, Urinary 98 pts, Bowels 81 pts  )    Time 10    Period Weeks    Status Achieved      PT LONG TERM GOAL #8   Title Pt will report no pain with sexual intercourse in order to improve QOL ( 95% bettter)    Time 10    Period Weeks    Status Achieved      PT LONG TERM GOAL  #9   TITLE Pt will be IND with pain science re-edu strategies ( Curable app and other resources) in order to return to ADL and improve QOL    Time 12    Period Weeks    Status Achieved   Target Date 01/10/20            Mariane Masters ,PT, DPT, E-RYT  01/05/2020, 8:50 AM  [facility] MAIN [facility] SERVICES [address] [place], [place], [address] Phone: [phone]   Fax:  [phone]

## 2020-01-11 ENCOUNTER — Ambulatory Visit (INDEPENDENT_AMBULATORY_CARE_PROVIDER_SITE_OTHER): Payer: 59 | Admitting: Psychology

## 2020-01-11 DIAGNOSIS — F411 Generalized anxiety disorder: Secondary | ICD-10-CM

## 2020-01-19 ENCOUNTER — Encounter: Payer: Managed Care, Other (non HMO) | Admitting: Physical Therapy

## 2020-01-25 ENCOUNTER — Ambulatory Visit (INDEPENDENT_AMBULATORY_CARE_PROVIDER_SITE_OTHER): Payer: 59 | Admitting: Psychology

## 2020-01-25 ENCOUNTER — Other Ambulatory Visit: Payer: Self-pay | Admitting: Family

## 2020-01-25 DIAGNOSIS — G4700 Insomnia, unspecified: Secondary | ICD-10-CM

## 2020-01-25 DIAGNOSIS — F411 Generalized anxiety disorder: Secondary | ICD-10-CM

## 2020-01-27 NOTE — Progress Notes (Signed)
Virtual Visit via Video Note  I connected with [name] on 02/02/20 at 10:20 AM EDT by a video enabled telemedicine application and verified that I am speaking with the correct person using two identifiers.   I discussed the limitations of evaluation and management by telemedicine and the availability of in person appointments. The patient expressed understanding and agreed to proceed.   I discussed the assessment and treatment plan with the patient. The patient was provided an opportunity to ask questions and all were answered. The patient agreed with the plan and demonstrated an understanding of the instructions.   The patient was advised to call back or seek an in-person evaluation if the symptoms worsen or if the condition fails to improve as anticipated.  Location: patient- work, provider- office   I provided 21 minutes of non-face-to-face time during this encounter.   Melanie Hotter, MD    [facility] MD/PA/NP OP Progress Note  02/02/2020 10:56 AM [name]  MRN:  [id number]  Chief Complaint:  Chief Complaint    Depression; Follow-up; Anxiety     HPI:  This is a follow-up appointment for depression, anxiety and insomnia.  She states that she has been stressful for the past few weeks.  Her father was admitted in the hospital; he was not taking insulin, and had arrhythmia. She had frank conversation with her father, and he has been taking his medication since then.  She states that she was also stressful as she had to take care of her mother while the deadline for work was approaching.  She had a phone conversation with her husband as well, and shared with him how she has been feeling, and discussed what is needed for each other.  She states that she was tired of being obligated to help everyone.  She agrees with the concept of self compassion, and healthy boundaries.  She states that she has been able to handle things in a calm and peaceful manner since up titration of bupropion.   She states that she used to be very anxious when she needed to take care of her parents.  She has occasional middle insomnia.  She feels less depressed.  She feels less fatigue.  She has fair concentration.  She denies any change in her appetite or weight.  She denies SI.  She feels anxious and tense at times, and she has been able to be more aware of it. She denies panic attacks. She denies irritability. She denies any side effect since up titration of bupropion.  She feels comfortable continuing her current medication regimen.    Employment:Operations analystasset in management teamfor16 months, used to be on regional support position Support:her husband Household:[name], her husband Marital status:married for 14 years, Number of children:0 Education- graduated from high school    Visit Diagnosis:    ICD-10-CM   1. Panic disorder  F41.0 sertraline (ZOLOFT) 100 MG tablet  2. GAD (generalized anxiety disorder)  F41.1   3. Mild episode of recurrent major depressive disorder (HCC)  F33.0   4. Insomnia, unspecified type  G47.00 sertraline (ZOLOFT) 100 MG tablet    Past Psychiatric History: Please see initial evaluation for full details. I have reviewed the history. No updates at this time.     Past Medical History:  Past Medical History:  Diagnosis Date  . Asthma   . PCOS (polycystic ovarian syndrome)   . UTI (urinary tract infection)     Past Surgical History:  Procedure Laterality Date  . BREAST  ENHANCEMENT SURGERY  2012  . SHOULDER SURGERY    . TONSILLECTOMY      Family Psychiatric History: Please see initial evaluation for full details. I have reviewed the history. No updates at this time.     Family History:  Family History  Problem Relation Age of Onset  . Arthritis Mother   . Heart disease Mother   . Stroke Mother   . Hypertension Mother   . Mental illness Mother   . Diabetes Mother   . Factor V Leiden deficiency Mother   . Sleep apnea Mother   .  Depression Mother   . Drug abuse Mother   . Mental illness Father   . Diabetes Father   . CAD Father   . Sleep apnea Father   . Bleeding Disorder Father        prothrombin gene mutuation  . Bipolar disorder Father   . Crohn's disease Brother   . Kidney failure Paternal Grandfather   . Suicidality Maternal Grandfather   . Bipolar disorder Paternal Aunt   . Clotting disorder Paternal Aunt   . Clotting disorder Paternal Grandmother   . Breast cancer Neg Hx   . Ovarian cancer Neg Hx   . Colon cancer Neg Hx     Social History:  Social History   Socioeconomic History  . Marital status: Married    Spouse name: Not on file  . Number of children: Not on file  . Years of education: Not on file  . Highest education level: Not on file  Occupational History  . Not on file  Tobacco Use  . Smoking status: Never Smoker  . Smokeless tobacco: Never Used  Vaping Use  . Vaping Use: Never used  Substance and Sexual Activity  . Alcohol use: Not Currently  . Drug use: No  . Sexual activity: Yes    Birth control/protection: Pill, None  Other Topics Concern  . 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       Social Determinants of Health   Financial Resource Strain:   . Difficulty of Paying Living Expenses: Not on file  Food Insecurity:   . Worried About Programme researcher, broadcasting/film/video in the Last Year: Not on file  . Ran Out of Food in the Last Year: Not on file  Transportation Needs:   . Lack of Transportation (Medical): Not on file  . Lack of Transportation (Non-Medical): Not on file  Physical Activity:   . Days of Exercise per Week: Not on file  . Minutes of Exercise per Session: Not on file  Stress:   . Feeling of Stress : Not on file  Social Connections:   . Frequency of Communication with Friends and Family: Not on file  . Frequency of Social Gatherings with Friends and Family: Not on file  . Attends Religious Services: Not on  file  . Active Member of Clubs or Organizations: Not on file  . Attends Banker Meetings: Not on file  . Marital Status: Not on file    Allergies:  Allergies  Allergen Reactions  . Aspirin Anaphylaxis  . Levaquin [Levofloxacin In D5w] Hives    Metabolic Disorder Labs: Lab Results  Component Value Date   HGBA1C 5.1 06/23/2019   No results found for: PROLACTIN Lab Results  Component Value Date   CHOL 230 (H) 01/04/2020   TRIG 66.0 01/04/2020   HDL 63.30 01/04/2020   CHOLHDL  4 01/04/2020   VLDL 13.2 01/04/2020   LDLCALC 153 (H) 01/04/2020   LDLCALC 162 (H) 06/23/2019   Lab Results  Component Value Date   TSH 0.68 06/23/2019   TSH 1.22 08/20/2018    Therapeutic Level Labs: No results found for: LITHIUM No results found for: VALPROATE No components found for:  CBMZ  Current Medications: Current Outpatient Medications  Medication Sig Dispense Refill  . buPROPion (WELLBUTRIN XL) 150 MG 24 hr tablet 450 mg daily. Take along with 300 mg tab 30 tablet 1  . buPROPion (WELLBUTRIN XL) 300 MG 24 hr tablet TAKE 1 TABLET(150 MG) BY MOUTH DAILY 90 tablet 1  . buPROPion (WELLBUTRIN XL) 300 MG 24 hr tablet 450 mg daily. Take along with 150 mg tab 90 tablet 0  . Eszopiclone 3 MG TABS TAKE 1 TABLET BY MOUTH AT BEDTIME 30 tablet 1  . norethindrone (MICRONOR) 0.35 MG tablet Take by mouth.    Marland Kitchen omeprazole (PRILOSEC) 20 MG capsule Take 20 mg by mouth daily.    Melene Muller ON 02/16/2020] sertraline (ZOLOFT) 100 MG tablet Take 1 tablet (100 mg total) by mouth at bedtime. 30 tablet 0  . spironolactone (ALDACTONE) 100 MG tablet Take by mouth.     No current facility-administered medications for this visit.     Musculoskeletal: Strength & Muscle Tone: N/A Gait & Station: N/A Patient leans: N/A  Psychiatric Specialty Exam: Review of Systems  Psychiatric/Behavioral: Positive for dysphoric mood and sleep disturbance. Negative for agitation, behavioral problems, confusion,  decreased concentration, hallucinations, self-injury and suicidal ideas. The patient is nervous/anxious. The patient is not hyperactive.   All other systems reviewed and are negative.   There were no vitals taken for this visit.There is no height or weight on file to calculate BMI.  General Appearance: Fairly Groomed  Eye Contact:  Good  Speech:  Clear and Coherent  Volume:  Normal  Mood:  Anxious  Affect:  Appropriate, Congruent and slightly down at times  Thought Process:  Coherent  Orientation:  Full (Time, Place, and Person)  Thought Content: Logical   Suicidal Thoughts:  No  Homicidal Thoughts:  No  Memory:  Immediate;   Good  Judgement:  Good  Insight:  Good  Psychomotor Activity:  Normal  Concentration:  Concentration: Good and Attention Span: Good  Recall:  Good  Fund of Knowledge: Good  Language: Good  Akathisia:  No  Handed:  Right  AIMS (if indicated): not done  Assets:  Communication Skills Desire for Improvement  ADL's:  Intact  Cognition: WNL  Sleep:  Poor   Screenings: GAD-7     Office Visit from 11/21/2019 in [facility]  Total GAD-7 Score 19    PHQ2-9     Office Visit from 11/21/2019 in [facility] Office Visit from 08/22/2019 in [facility] [place] Video Visit from 11/29/2018 in [facility] Office Visit from 09/23/2018 in [facility]  Office Visit from 12/29/2017 in [facility]   PHQ-2 Total Score 2 0 0 0 0  PHQ-9 Total Score 16 8 0 0 --       Assessment and Plan:  TAMMRA PRESSMAN is a 29 y.o. year old female with a history of depression, anxiety,PCOS  , who presents for follow up appointment for below.    1. Panic disorder 2. GAD (generalized anxiety disorder) 3. Mild episode of recurrent major depressive disorder (HCC) She reports overall improvement in anxiety and depressive symptoms  since up titration of bupropion. Psychosocial stressors  includes work , conflict with her family, and trauma history as a teenager.   Will continue current dose of bupropion to target depression, which has been also beneficial for anxiety.  She has no known history of seizure.  Will continue sertraline to target anxiety and depression.  Will consider further up titration of sertraline if any worsening in her mood symptoms.  She will greatly benefit from CBT ; she will continue to see her therapist.   4. Insomnia, unspecified type She has middle insomnia.  Will continue Lunesta for insomnia.    Plan 1. Increase Bupropion 450 mg daily 2. Continue sertraline 100 mg daily 3. Continue Lunesta 3 mg at night as needed for sleep 4. Next appointment: 11/18 at 8:30  for 30 mins, video  I have utilized the [place] Controlled Substances Reporting System (PMP AWARxE) to confirm adherence regarding the patient's medication. My review reveals appropriate prescription fills.   The patient demonstrates the following risk factors for suicide: Chronic risk factors for suicide include:psychiatric disorder ofanxietyand completed suicide in a family member. Acute risk factorsfor suicide include: family or marital conflict and loss (financial, interpersonal, professional). Protective factorsfor this patient include: positive social support, positive therapeutic relationship, responsibility to others (children, family), coping skills and hope for the future. Considering these factors, the overall suicide risk at this point appears to below. Patientisappropriate for outpatient follow up.   Melanie Hottereina Leonarda Leis, MD 02/02/2020, 10:56 AM

## 2020-02-02 ENCOUNTER — Telehealth (INDEPENDENT_AMBULATORY_CARE_PROVIDER_SITE_OTHER): Payer: 59 | Admitting: Psychiatry

## 2020-02-02 ENCOUNTER — Encounter (HOSPITAL_COMMUNITY): Payer: Self-pay | Admitting: Psychiatry

## 2020-02-02 ENCOUNTER — Other Ambulatory Visit: Payer: Self-pay

## 2020-02-02 DIAGNOSIS — G4700 Insomnia, unspecified: Secondary | ICD-10-CM | POA: Diagnosis not present

## 2020-02-02 DIAGNOSIS — F410 Panic disorder [episodic paroxysmal anxiety] without agoraphobia: Secondary | ICD-10-CM | POA: Diagnosis not present

## 2020-02-02 DIAGNOSIS — F330 Major depressive disorder, recurrent, mild: Secondary | ICD-10-CM | POA: Diagnosis not present

## 2020-02-02 DIAGNOSIS — F411 Generalized anxiety disorder: Secondary | ICD-10-CM | POA: Diagnosis not present

## 2020-02-02 MED ORDER — SERTRALINE HCL 100 MG PO TABS: 100.0000 mg | ORAL_TABLET | Freq: Every day | ORAL | 0 refills | Status: DC

## 2020-02-22 ENCOUNTER — Ambulatory Visit: Payer: Managed Care, Other (non HMO) | Admitting: Family

## 2020-02-23 ENCOUNTER — Ambulatory Visit: Payer: 59 | Admitting: Psychology

## 2020-03-05 ENCOUNTER — Telehealth: Payer: Self-pay | Admitting: Psychiatry

## 2020-03-05 NOTE — Progress Notes (Signed)
Virtual Visit via Video Note  I connected with [name] on 03/15/20 at  8:40 AM EST by a video enabled telemedicine application and verified that I am speaking with the correct person using two identifiers.  Location: Patient: car Provider: office   I discussed the limitations of evaluation and management by telemedicine and the availability of in person appointments. The patient expressed understanding and agreed to proceed.    I discussed the assessment and treatment plan with the patient. The patient was provided an opportunity to ask questions and all were answered. The patient agreed with the plan and demonstrated an understanding of the instructions.   The patient was advised to call back or seek an in-person evaluation if the symptoms worsen or if the condition fails to improve as anticipated.  I provided 20 minutes of non-face-to-face time during this encounter.   [name], MD    [facility] MD/PA/NP OP Progress Note  03/15/2020 9:01 AM [name]  MRN:  [id number]  Chief Complaint:  Chief Complaint    Depression; Follow-up; Anxiety     HPI:  This is a follow-up appointment for depression and anxiety.  She states that she was depressed and more stressed/frustrated when she could not take bupropion 450 mg due to delay in delivery of the medication.  She notices the difference when she is able to take a full dose.  She has been stressed at work due to upcoming holiday season.  However, she has been able to handle things well when she takes full dose of bupropion.  She reports good relationship with her parents.  She had a phone conversation to have good boundary with them.  Her father is doing well since he takes insulin.  She has occasional middle insomnia.  She feels less fatigue.  She has fair concentration.  Although her appetite continues to be less, she denies any change in weight.  She has fair motivation.  She denies SI.  She feels less anxious, and denies  significant anxiety episode.   Employment:Operations analystasset in management teamfor16 months, used to be on regional support position Support:her husband Household:[name], her husband Marital status:married for 14 years, Number of children:0 Education- graduated from high school  Visit Diagnosis:    ICD-10-CM   1. MDD (major depressive disorder), recurrent, in partial remission (HCC)  F33.41   2. Panic disorder  F41.0 sertraline (ZOLOFT) 100 MG tablet  3. Insomnia, unspecified type  G47.00 Eszopiclone 3 MG TABS    Past Psychiatric History: Please see initial evaluation for full details. I have reviewed the history. No updates at this time.     Past Medical History:  Past Medical History:  Diagnosis Date  . Asthma   . PCOS (polycystic ovarian syndrome)   . UTI (urinary tract infection)     Past Surgical History:  Procedure Laterality Date  . BREAST ENHANCEMENT SURGERY  2012  . SHOULDER SURGERY    . TONSILLECTOMY      Family Psychiatric History: Please see initial evaluation for full details. I have reviewed the history. No updates at this time.     Family History:  Family History  Problem Relation Age of Onset  . Arthritis Mother   . Heart disease Mother   . Stroke Mother   . Hypertension Mother   . Mental illness Mother   . Diabetes Mother   . Factor V Leiden deficiency Mother   . Sleep apnea Mother   . Depression Mother   . Drug  abuse Mother   . Mental illness Father   . Diabetes Father   . CAD Father   . Sleep apnea Father   . Bleeding Disorder Father        prothrombin gene mutuation  . Bipolar disorder Father   . Crohn's disease Brother   . Kidney failure Paternal Grandfather   . Suicidality Maternal Grandfather   . Bipolar disorder Paternal Aunt   . Clotting disorder Paternal Aunt   . Clotting disorder Paternal Grandmother   . Breast cancer Neg Hx   . Ovarian cancer Neg Hx   . Colon cancer Neg Hx     Social History:  Social  History   Socioeconomic History  . Marital status: Married    Spouse name: Not on file  . Number of children: Not on file  . Years of education: Not on file  . Highest education level: Not on file  Occupational History  . Not on file  Tobacco Use  . Smoking status: Never Smoker  . Smokeless tobacco: Never Used  Vaping Use  . Vaping Use: Never used  Substance and Sexual Activity  . Alcohol use: Not Currently  . Drug use: No  . Sexual activity: Yes    Birth control/protection: Pill, None  Other Topics Concern  . 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       Social Determinants of Health   Financial Resource Strain:   . Difficulty of Paying Living Expenses: Not on file  Food Insecurity:   . Worried About Programme researcher, broadcasting/film/videounning Out of Food in the Last Year: Not on file  . Ran Out of Food in the Last Year: Not on file  Transportation Needs:   . Lack of Transportation (Medical): Not on file  . Lack of Transportation (Non-Medical): Not on file  Physical Activity:   . Days of Exercise per Week: Not on file  . Minutes of Exercise per Session: Not on file  Stress:   . Feeling of Stress : Not on file  Social Connections:   . Frequency of Communication with Friends and Family: Not on file  . Frequency of Social Gatherings with Friends and Family: Not on file  . Attends Religious Services: Not on file  . Active Member of Clubs or Organizations: Not on file  . Attends BankerClub or Organization Meetings: Not on file  . Marital Status: Not on file    Allergies:  Allergies  Allergen Reactions  . Aspirin Anaphylaxis  . Levaquin [Levofloxacin In D5w] Hives    Metabolic Disorder Labs: Lab Results  Component Value Date   HGBA1C 5.1 06/23/2019   No results found for: PROLACTIN Lab Results  Component Value Date   CHOL 230 (H) 01/04/2020   TRIG 66.0 01/04/2020   HDL 63.30 01/04/2020   CHOLHDL 4 01/04/2020   VLDL 13.2 01/04/2020    LDLCALC 153 (H) 01/04/2020   LDLCALC 162 (H) 06/23/2019   Lab Results  Component Value Date   TSH 0.68 06/23/2019   TSH 1.22 08/20/2018    Therapeutic Level Labs: No results found for: LITHIUM No results found for: VALPROATE No components found for:  CBMZ  Current Medications: Current Outpatient Medications  Medication Sig Dispense Refill  . buPROPion (WELLBUTRIN XL) 150 MG 24 hr tablet 450 mg daily. Take along with 300 mg tab 90 tablet 0  . buPROPion (WELLBUTRIN XL) 300 MG 24 hr tablet 450 mg daily. Take along  with 150 mg tab 90 tablet 0  . [START ON 03/27/2020] Eszopiclone 3 MG TABS Take 1 tablet (3 mg total) by mouth at bedtime. Take immediately before bedtime 30 tablet 1  . norethindrone (MICRONOR) 0.35 MG tablet Take by mouth.    Marland Kitchen omeprazole (PRILOSEC) 20 MG capsule Take 20 mg by mouth daily.    . sertraline (ZOLOFT) 100 MG tablet Take 1 tablet (100 mg total) by mouth at bedtime. 90 tablet 0  . spironolactone (ALDACTONE) 100 MG tablet Take by mouth.     No current facility-administered medications for this visit.     Musculoskeletal: Strength & Muscle Tone: N/A Gait & Station: N/A Patient leans: N/A  Psychiatric Specialty Exam: Review of Systems  Psychiatric/Behavioral: Positive for sleep disturbance. Negative for agitation, behavioral problems, confusion, decreased concentration, dysphoric mood, hallucinations, self-injury and suicidal ideas. The patient is not nervous/anxious and is not hyperactive.   All other systems reviewed and are negative.   There were no vitals taken for this visit.There is no height or weight on file to calculate BMI.  General Appearance: Fairly Groomed  Eye Contact:  Good  Speech:  Clear and Coherent  Volume:  Normal  Mood:  better  Affect:  Appropriate, Congruent and euthymic  Thought Process:  Coherent  Orientation:  Full (Time, Place, and Person)  Thought Content: Logical   Suicidal Thoughts:  No  Homicidal Thoughts:  No  Memory:   Immediate;   Good  Judgement:  Good  Insight:  Good  Psychomotor Activity:  Normal  Concentration:  Concentration: Good and Attention Span: Good  Recall:  Good  Fund of Knowledge: Good  Language: Good  Akathisia:  No  Handed:  Right  AIMS (if indicated): not done  Assets:  Communication Skills Desire for Improvement  ADL's:  Intact  Cognition: WNL  Sleep:  Fair   Screenings: GAD-7     Office Visit from 11/21/2019 in [facility]  Total GAD-7 Score 19    PHQ2-9     Office Visit from 11/21/2019 in [facility] Office Visit from 08/22/2019 in [facility] Video Visit from 11/29/2018 in [facility] Office Visit from 09/23/2018 in [facility] [place] Office Visit from 12/29/2017 in [facility]  PHQ-2 Total Score 2 0 0 0 0  PHQ-9 Total Score 16 8 0 0 --       Assessment and Plan:  ABREANNA DRAWDY is a 29 y.o. year old female with a history of  depression, anxiety,PCOS, who presents for follow up appointment for below.   1. Panic disorder 3. MDD (major depressive disorder), recurrent, in partial remission (HCC) There has been steady improvement in depressive symptoms and anxiety since up titration of bupropion.  Psychosocial stressors includes work, conflict with her family, and trauma history as a teenager.  Will continue current dose of bupropion to target depression, which has been also beneficial for anxiety.  She has no known history of seizure.  Will continue sertraline to target depression and anxiety.   2. Insomnia, unspecified type She continues to have occasional middle insomnia.  Will continue current dose of Lunesta for insomnia.   Plan 1.ContinueBupropion 450 mg daily 2. Continuesertraline 100 mg daily 3. ContinueLunesta 3 mg at night as needed for sleep 4.Next appointment: 1/6 at 9:6for 30 mins, video  I have utilized the [place] Controlled Substances  Reporting System (PMP AWARxE) to confirm adherence regarding the patient's medication. My review reveals  appropriate prescription fills.   The patient demonstrates the following risk factors for suicide: Chronic risk factors for suicide include:psychiatric disorder ofanxietyand completed suicide in a family member. Acute risk factorsfor suicide include: family or marital conflict and loss (financial, interpersonal, professional). Protective factorsfor this patient include: positive social support, positive therapeutic relationship, responsibility to others (children, family), coping skills and hope for the future. Considering these factors, the overall suicide risk at this point appears to below. Patientisappropriate for outpatient follow up.  [name], MD 03/15/2020, 9:01 AM

## 2020-03-15 ENCOUNTER — Other Ambulatory Visit: Payer: Self-pay

## 2020-03-15 ENCOUNTER — Telehealth (HOSPITAL_COMMUNITY): Payer: 59 | Admitting: Psychiatry

## 2020-03-15 ENCOUNTER — Telehealth (INDEPENDENT_AMBULATORY_CARE_PROVIDER_SITE_OTHER): Payer: 59 | Admitting: Psychiatry

## 2020-03-15 ENCOUNTER — Encounter: Payer: Self-pay | Admitting: Psychiatry

## 2020-03-15 DIAGNOSIS — F3341 Major depressive disorder, recurrent, in partial remission: Secondary | ICD-10-CM | POA: Diagnosis not present

## 2020-03-15 DIAGNOSIS — F410 Panic disorder [episodic paroxysmal anxiety] without agoraphobia: Secondary | ICD-10-CM | POA: Diagnosis not present

## 2020-03-15 DIAGNOSIS — G4700 Insomnia, unspecified: Secondary | ICD-10-CM | POA: Diagnosis not present

## 2020-03-15 MED ORDER — BUPROPION HCL ER (XL) 300 MG PO TB24
ORAL_TABLET | ORAL | 0 refills | Status: DC
Start: 2020-03-15 — End: 2020-05-03

## 2020-03-15 MED ORDER — SERTRALINE HCL 100 MG PO TABS: 100.0000 mg | ORAL_TABLET | Freq: Every day | ORAL | 0 refills | Status: DC

## 2020-03-15 MED ORDER — ESZOPICLONE 3 MG PO TABS: 3.0000 mg | ORAL_TABLET | Freq: Every day | ORAL | 1 refills | Status: DC

## 2020-03-15 MED ORDER — BUPROPION HCL ER (XL) 150 MG PO TB24
ORAL_TABLET | ORAL | 0 refills | Status: DC
Start: 2020-03-15 — End: 2020-05-03

## 2020-03-15 NOTE — Patient Instructions (Signed)
1.ContinueBupropion 450 mg daily 2. Continuesertraline 100 mg daily 3. ContinueLunesta 3 mg at night as needed for sleep 4.Next appointment: 1/6 at 9:30

## 2020-03-21 ENCOUNTER — Encounter: Payer: Self-pay | Admitting: Family

## 2020-03-21 ENCOUNTER — Other Ambulatory Visit: Payer: Self-pay

## 2020-03-21 ENCOUNTER — Ambulatory Visit (INDEPENDENT_AMBULATORY_CARE_PROVIDER_SITE_OTHER): Payer: Self-pay | Admitting: Family

## 2020-03-21 DIAGNOSIS — F32A Depression, unspecified: Secondary | ICD-10-CM

## 2020-03-21 DIAGNOSIS — R102 Pelvic and perineal pain: Secondary | ICD-10-CM

## 2020-03-21 DIAGNOSIS — F419 Anxiety disorder, unspecified: Secondary | ICD-10-CM

## 2020-03-21 NOTE — Progress Notes (Signed)
Subjective:    Patient ID: [name], female    DOB: March 29, 1991, 29 y.o.   MRN: [id number]  CC: INGRA ROTHER is a 29 y.o. female who presents today for follow up.   HPI: Feels well today  No complaints.   Pelvic pain has resolved. Appetite improved.  Currently trying to concieve. She is following with OB and high risk OB due to h/o prothrombin mutation.   Depression- following with psychiatry, Dr Vanetta Shawl. She  feels regimen of zoloft , wellbutrin, and lunesta working well.          HISTORY:  Past Medical History:  Diagnosis Date  . Asthma   . PCOS (polycystic ovarian syndrome)   . UTI (urinary tract infection)    Past Surgical History:  Procedure Laterality Date  . BREAST ENHANCEMENT SURGERY  2012  . SHOULDER SURGERY    . TONSILLECTOMY     Family History  Problem Relation Age of Onset  . Arthritis Mother   . Heart disease Mother   . Stroke Mother   . Hypertension Mother   . Mental illness Mother   . Diabetes Mother   . Factor V Leiden deficiency Mother   . Sleep apnea Mother   . Depression Mother   . Drug abuse Mother   . Mental illness Father   . Diabetes Father   . CAD Father   . Sleep apnea Father   . Bleeding Disorder Father        prothrombin gene mutuation  . Bipolar disorder Father   . Crohn's disease Brother   . Kidney failure Paternal Grandfather   . Suicidality Maternal Grandfather   . Bipolar disorder Paternal Aunt   . Clotting disorder Paternal Aunt   . Clotting disorder Paternal Grandmother   . Breast cancer Neg Hx   . Ovarian cancer Neg Hx   . Colon cancer Neg Hx     Allergies: Aspirin, Levaquin [levofloxacin in d5w], and Sulfa antibiotics Current Outpatient Medications on File Prior to Visit  Medication Sig Dispense Refill  . buPROPion (WELLBUTRIN XL) 150 MG 24 hr tablet 450 mg daily. Take along with 300 mg tab 90 tablet 0  . buPROPion (WELLBUTRIN XL) 300 MG 24 hr tablet 450 mg daily. Take along with 150 mg tab 90 tablet 0  .  [START ON 03/27/2020] Eszopiclone 3 MG TABS Take 1 tablet (3 mg total) by mouth at bedtime. Take immediately before bedtime 30 tablet 1  . sertraline (ZOLOFT) 100 MG tablet Take 1 tablet (100 mg total) by mouth at bedtime. 90 tablet 0   No current facility-administered medications on file prior to visit.    Social History   Tobacco Use  . Smoking status: Never Smoker  . Smokeless tobacco: Never Used  Vaping Use  . Vaping Use: Never used  Substance Use Topics  . Alcohol use: Not Currently  . Drug use: No    Review of Systems  Constitutional: Negative for chills and fever.  Respiratory: Negative for cough.   Cardiovascular: Negative for chest pain and palpitations.  Gastrointestinal: Negative for nausea and vomiting.  Psychiatric/Behavioral: Negative for self-injury, sleep disturbance and suicidal ideas. The patient is not nervous/anxious.       Objective:    BP 104/62   Pulse 83   Temp 98.5 F (36.9 C)   Ht 5\' 7"  (1.702 m)   Wt 139 lb 6.4 oz (63.2 kg)   SpO2 99%   BMI 21.83 kg/m  BP Readings from  Last 3 Encounters:  03/21/20 104/62  11/21/19 (!) 102/62  08/22/19 102/64   Wt Readings from Last 3 Encounters:  03/21/20 139 lb 6.4 oz (63.2 kg)  11/21/19 132 lb 3.2 oz (60 kg)  08/22/19 128 lb 3.2 oz (58.2 kg)    Physical Exam Vitals reviewed.  Constitutional:      Appearance: She is well-developed.  Eyes:     Conjunctiva/sclera: Conjunctivae normal.  Cardiovascular:     Rate and Rhythm: Normal rate and regular rhythm.     Pulses: Normal pulses.     Heart sounds: Normal heart sounds.  Pulmonary:     Effort: Pulmonary effort is normal.     Breath sounds: Normal breath sounds. No wheezing, rhonchi or rales.  Skin:    General: Skin is warm and dry.  Neurological:     Mental Status: She is alert.  Psychiatric:        Speech: Speech normal.        Behavior: Behavior normal.        Thought Content: Thought content normal.        Assessment & Plan:    Problem List Items Addressed This Visit      Other   Anxiety and depression    Stable. Follows with Dr Vanetta Shawl. Will follow      Pelvic pain    Resolved at this time. Appetite improved and she has gained weight. Will follow          I have discontinued Antonia C. Ferrucci's omeprazole, norethindrone, and spironolactone. I am also having her maintain her buPROPion, buPROPion, sertraline, and Eszopiclone.   No orders of the defined types were placed in this encounter.   Return precautions given.   Risks, benefits, and alternatives of the medications and treatment plan prescribed today were discussed, and patient expressed understanding.   Education regarding symptom management and diagnosis given to patient on AVS.  Continue to follow with [name], FNP for routine health maintenance.   [name] and I agreed with plan.   Rennie Plowman, FNP

## 2020-03-21 NOTE — Patient Instructions (Signed)
Nice to see you!   

## 2020-03-26 DIAGNOSIS — R102 Pelvic and perineal pain: Secondary | ICD-10-CM | POA: Insufficient documentation

## 2020-03-26 NOTE — Assessment & Plan Note (Signed)
Resolved at this time. Appetite improved and she has gained weight. Will follow

## 2020-03-26 NOTE — Assessment & Plan Note (Signed)
Stable. Follows with Dr Vanetta Shawl. Will follow

## 2020-03-29 ENCOUNTER — Encounter: Payer: Self-pay | Admitting: Family

## 2020-04-19 NOTE — Progress Notes (Signed)
Virtual Visit via Telephone Note  I connected with [name] on 05/03/20 at  9:30 AM EST by telephone and verified that I am speaking with the correct person using two identifiers.  Location: Patient: work Provider: office Persons participated in the visit- patient, provider   I discussed the limitations, risks, security and privacy concerns of performing an evaluation and management service by telephone and the availability of in person appointments. I also discussed with the patient that there may be a patient responsible charge related to this service. The patient expressed understanding and agreed to proceed.     I discussed the assessment and treatment plan with the patient. The patient was provided an opportunity to ask questions and all were answered. The patient agreed with the plan and demonstrated an understanding of the instructions.   The patient was advised to call back or seek an in-person evaluation if the symptoms worsen or if the condition fails to improve as anticipated.  I provided 18 minutes of non-face-to-face time during this encounter.   Melanie Hotter, MD     [facility] MD/PA/NP OP Progress Note  05/03/2020 10:05 AM [name]  MRN:  [id number]  Chief Complaint:  Chief Complaint    Follow-up; Depression; Anxiety     HPI:  This is a follow-up appointment for depression and anxiety.  She states that she was busy during Christmas.  She also states that her first cousin has started to stay at the patient's.  She was found to have brain tumor metastasis to her skull, and is going through radiation. She discussed with her husband, who initially was not for her cousin to stay.  She states that everything is okay so far, although the things can be tense at times.  She reports good relationship with her husband.  Her husband is still trying to find a provider for mental health, and he is not actively trying to change his job, although he seems to be very stressed at  work.  She sleeps better.  She denies feeling depressed.  She has good energy and motivation.  She has good concentration.  She continues to have appetite and loss, although she denies any weight loss as she is able to make herself eat.  She denies SI.  She denies anxiety or panic attacks.  She is using the mindfulness app called Bloom; she finds it helpful, and would like to see how it works for her rather than seeing a therapist.   Employment:Operations analystasset in management teamfor16 months, used to be on regional support position Support:her husband Household:[name], her husband Marital status:married for 14 years, Number of children:0 Education- graduated from high school  Visit Diagnosis:    ICD-10-CM   1. MDD (major depressive disorder), recurrent, in full remission (HCC)  F33.42   2. Panic disorder  F41.0 sertraline (ZOLOFT) 100 MG tablet  3. Insomnia, unspecified type  G47.00 Eszopiclone 3 MG TABS    Past Psychiatric History: Please see initial evaluation for full details. I have reviewed the history. No updates at this time.     Past Medical History:  Past Medical History:  Diagnosis Date  . Asthma   . PCOS (polycystic ovarian syndrome)   . UTI (urinary tract infection)     Past Surgical History:  Procedure Laterality Date  . BREAST ENHANCEMENT SURGERY  2012  . SHOULDER SURGERY    . TONSILLECTOMY      Family Psychiatric History: Please see initial evaluation for full details. I have  reviewed the history. No updates at this time.     Family History:  Family History  Problem Relation Age of Onset  . Arthritis Mother   . Heart disease Mother   . Stroke Mother   . Hypertension Mother   . Mental illness Mother   . Diabetes Mother   . Factor V Leiden deficiency Mother   . Sleep apnea Mother   . Depression Mother   . Drug abuse Mother   . Mental illness Father   . Diabetes Father   . CAD Father   . Sleep apnea Father   . Bleeding Disorder Father         prothrombin gene mutuation  . Bipolar disorder Father   . Crohn's disease Brother   . Kidney failure Paternal Grandfather   . Suicidality Maternal Grandfather   . Bipolar disorder Paternal Aunt   . Clotting disorder Paternal Aunt   . Clotting disorder Paternal Grandmother   . Breast cancer Neg Hx   . Ovarian cancer Neg Hx   . Colon cancer Neg Hx     Social History:  Social History   Socioeconomic History  . Marital status: Married    Spouse name: Not on file  . Number of children: Not on file  . Years of education: Not on file  . Highest education level: Not on file  Occupational History  . Not on file  Tobacco Use  . Smoking status: Never Smoker  . Smokeless tobacco: Never Used  Vaping Use  . Vaping Use: Never used  Substance and Sexual Activity  . Alcohol use: Not Currently  . Drug use: No  . Sexual activity: Yes    Birth control/protection: Pill, None  Other Topics Concern  . 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       Social Determinants of Health   Financial Resource Strain: Not on file  Food Insecurity: Not on file  Transportation Needs: Not on file  Physical Activity: Not on file  Stress: Not on file  Social Connections: Not on file    Allergies:  Allergies  Allergen Reactions  . Aspirin Anaphylaxis  . Levaquin [Levofloxacin In D5w] Hives  . Sulfa Antibiotics Other (See Comments)    Metabolic Disorder Labs: Lab Results  Component Value Date   HGBA1C 5.1 06/23/2019   No results found for: PROLACTIN Lab Results  Component Value Date   CHOL 230 (H) 01/04/2020   TRIG 66.0 01/04/2020   HDL 63.30 01/04/2020   CHOLHDL 4 01/04/2020   VLDL 13.2 01/04/2020   LDLCALC 153 (H) 01/04/2020   LDLCALC 162 (H) 06/23/2019   Lab Results  Component Value Date   TSH 0.68 06/23/2019   TSH 1.22 08/20/2018    Therapeutic Level Labs: No results found for: LITHIUM No results found for:  VALPROATE No components found for:  CBMZ  Current Medications: Current Outpatient Medications  Medication Sig Dispense Refill  . [START ON 06/15/2020] buPROPion (WELLBUTRIN XL) 150 MG 24 hr tablet 450 mg daily. Take along with 300 mg tab 90 tablet 0  . [START ON 06/15/2020] buPROPion (WELLBUTRIN XL) 300 MG 24 hr tablet 450 mg daily. Take along with 150 mg tab 90 tablet 0  . [START ON 05/23/2020] Eszopiclone 3 MG TABS Take 1 tablet (3 mg total) by mouth at bedtime. Take immediately before bedtime 30 tablet 0  . [START ON 06/14/2020] sertraline (ZOLOFT) 100 MG tablet Take  1 tablet (100 mg total) by mouth at bedtime. 90 tablet 0   No current facility-administered medications for this visit.     Musculoskeletal: Strength & Muscle Tone: N/A Gait & Station: N/A Patient leans: N/A  Psychiatric Specialty Exam: Review of Systems  Psychiatric/Behavioral: Negative for agitation, behavioral problems, confusion, decreased concentration, dysphoric mood, hallucinations, self-injury, sleep disturbance and suicidal ideas. The patient is not nervous/anxious and is not hyperactive.   All other systems reviewed and are negative.   There were no vitals taken for this visit.There is no height or weight on file to calculate BMI.  General Appearance: NA  Eye Contact:  NA  Speech:  Clear and Coherent  Volume:  Normal  Mood:  good  Affect:  NA  Thought Process:  Coherent  Orientation:  Full (Time, Place, and Person)  Thought Content: Logical   Suicidal Thoughts:  No  Homicidal Thoughts:  No  Memory:  Immediate;   Good  Judgement:  Good  Insight:  Good  Psychomotor Activity:  Normal  Concentration:  Concentration: Good and Attention Span: Good  Recall:  Good  Fund of Knowledge: Good  Language: Good  Akathisia:  No  Handed:  Right  AIMS (if indicated): not done  Assets:  Communication Skills Desire for Improvement  ADL's:  Intact  Cognition: WNL  Sleep:  Good   Screenings: GAD-7   Flowsheet  Row Office Visit from 11/21/2019 in [facility]  Total GAD-7 Score 19    PHQ2-9   Flowsheet Row Office Visit from 03/21/2020 in [facility] [place] Office Visit from 11/21/2019 in [facility] [place] Office Visit from 08/22/2019 in [facility] [place] Video Visit from 11/29/2018 in [facility] [place] Office Visit from 09/23/2018 in [facility]   PHQ-2 Total Score 1 2 0 0 0  PHQ-9 Total Score 4 16 8  0 0       Assessment and Plan:  [name] is a 29 y.o. year old female with a history of  depression, anxiety,PCOS, who presents for follow up appointment for below.   1. Panic disorder 2. MDD, recurrent in full remission She denies significant mood symptoms since the last visit.  Psychosocial stressors include work, conflict with her family, and trauma history as a teenager.  Would continue current dose of bupropion to target depression, which has been also beneficial for anxiety.  She has no known history of seizure.  We will continue sertraline to target depression and anxiety.   2. Insomnia, unspecified type Improved.  We will continue current dose of Lunesta to target insomnia.   Plan 1.ContinueBupropion450 mg daily 2. Continuesertraline 100 mg daily 3. ContinueLunesta 3 mg at night as needed for sleep 4.Next appointment:2/24 at 9:30 for 30 mins, video  The patient demonstrates the following risk factors for suicide: Chronic risk factors for suicide include:psychiatric disorder ofanxietyand completed suicide in a family member. Acute risk factorsfor suicide include: family or marital conflict and loss (financial, interpersonal, professional). Protective factorsfor this patient include: positive social support, positive therapeutic relationship, responsibility to others (children, family), coping skills and hope for the future. Considering these factors, the overall suicide risk at this  point appears to below. Patientisappropriate for outpatient follow up.  38, MD 05/03/2020, 10:05 AM

## 2020-05-03 ENCOUNTER — Encounter: Payer: Self-pay | Admitting: Psychiatry

## 2020-05-03 ENCOUNTER — Telehealth (INDEPENDENT_AMBULATORY_CARE_PROVIDER_SITE_OTHER): Payer: 59 | Admitting: Psychiatry

## 2020-05-03 ENCOUNTER — Other Ambulatory Visit: Payer: Self-pay

## 2020-05-03 DIAGNOSIS — G4700 Insomnia, unspecified: Secondary | ICD-10-CM | POA: Diagnosis not present

## 2020-05-03 DIAGNOSIS — F410 Panic disorder [episodic paroxysmal anxiety] without agoraphobia: Secondary | ICD-10-CM

## 2020-05-03 DIAGNOSIS — F3342 Major depressive disorder, recurrent, in full remission: Secondary | ICD-10-CM

## 2020-05-03 MED ORDER — BUPROPION HCL ER (XL) 300 MG PO TB24
ORAL_TABLET | ORAL | 0 refills | Status: DC
Start: 2020-06-15 — End: 2020-09-06

## 2020-05-03 MED ORDER — SERTRALINE HCL 100 MG PO TABS: 100.0000 mg | ORAL_TABLET | Freq: Every day | ORAL | 0 refills | Status: DC

## 2020-05-03 MED ORDER — BUPROPION HCL ER (XL) 150 MG PO TB24
ORAL_TABLET | ORAL | 0 refills | Status: DC
Start: 2020-06-15 — End: 2020-09-06

## 2020-05-03 MED ORDER — ESZOPICLONE 3 MG PO TABS: 3.0000 mg | ORAL_TABLET | Freq: Every day | ORAL | 0 refills | Status: DC

## 2020-06-14 NOTE — Progress Notes (Deleted)
[facility] MD/PA/NP OP Progress Note  06/14/2020 1:45 PM [name]  MRN:  [id number]  Chief Complaint:  HPI: *** Visit Diagnosis: No diagnosis found.  Past Psychiatric History: Please see initial evaluation for full details. I have reviewed the history. No updates at this time.     Past Medical History:  Past Medical History:  Diagnosis Date  . Asthma   . PCOS (polycystic ovarian syndrome)   . UTI (urinary tract infection)     Past Surgical History:  Procedure Laterality Date  . BREAST ENHANCEMENT SURGERY  2012  . SHOULDER SURGERY    . TONSILLECTOMY      Family Psychiatric History: Please see initial evaluation for full details. I have reviewed the history. No updates at this time.     Family History:  Family History  Problem Relation Age of Onset  . Arthritis Mother   . Heart disease Mother   . Stroke Mother   . Hypertension Mother   . Mental illness Mother   . Diabetes Mother   . Factor V Leiden deficiency Mother   . Sleep apnea Mother   . Depression Mother   . Drug abuse Mother   . Mental illness Father   . Diabetes Father   . CAD Father   . Sleep apnea Father   . Bleeding Disorder Father        prothrombin gene mutuation  . Bipolar disorder Father   . Crohn's disease Brother   . Kidney failure Paternal Grandfather   . Suicidality Maternal Grandfather   . Bipolar disorder Paternal Aunt   . Clotting disorder Paternal Aunt   . Clotting disorder Paternal Grandmother   . Breast cancer Neg Hx   . Ovarian cancer Neg Hx   . Colon cancer Neg Hx     Social History:  Social History   Socioeconomic History  . Marital status: Married    Spouse name: Not on file  . Number of children: Not on file  . Years of education: Not on file  . Highest education level: Not on file  Occupational History  . Not on file  Tobacco Use  . Smoking status: Never Smoker  . Smokeless tobacco: Never Used  Vaping Use  . Vaping Use: Never used  Substance and Sexual Activity   . Alcohol use: Not Currently  . Drug use: No  . Sexual activity: Yes    Birth control/protection: Pill, None  Other Topics Concern  . 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       Social Determinants of Health   Financial Resource Strain: Not on file  Food Insecurity: Not on file  Transportation Needs: Not on file  Physical Activity: Not on file  Stress: Not on file  Social Connections: Not on file    Allergies:  Allergies  Allergen Reactions  . Aspirin Anaphylaxis  . Levaquin [Levofloxacin In D5w] Hives  . Sulfa Antibiotics Other (See Comments)    Metabolic Disorder Labs: Lab Results  Component Value Date   HGBA1C 5.1 06/23/2019   No results found for: PROLACTIN Lab Results  Component Value Date   CHOL 230 (H) 01/04/2020   TRIG 66.0 01/04/2020   HDL 63.30 01/04/2020   CHOLHDL 4 01/04/2020   VLDL 13.2 01/04/2020   LDLCALC 153 (H) 01/04/2020   LDLCALC 162 (H) 06/23/2019   Lab Results  Component Value Date   TSH 0.68 06/23/2019   TSH  1.22 08/20/2018    Therapeutic Level Labs: No results found for: LITHIUM No results found for: VALPROATE No components found for:  CBMZ  Current Medications: Current Outpatient Medications  Medication Sig Dispense Refill  . [START ON 06/15/2020] buPROPion (WELLBUTRIN XL) 150 MG 24 hr tablet 450 mg daily. Take along with 300 mg tab 90 tablet 0  . [START ON 06/15/2020] buPROPion (WELLBUTRIN XL) 300 MG 24 hr tablet 450 mg daily. Take along with 150 mg tab 90 tablet 0  . Eszopiclone 3 MG TABS Take 1 tablet (3 mg total) by mouth at bedtime. Take immediately before bedtime 30 tablet 0  . sertraline (ZOLOFT) 100 MG tablet Take 1 tablet (100 mg total) by mouth at bedtime. 90 tablet 0   No current facility-administered medications for this visit.     Musculoskeletal: Strength & Muscle Tone: N/A Gait & Station: N/A Patient leans: N/A  Psychiatric Specialty Exam: Review  of Systems  There were no vitals taken for this visit.There is no height or weight on file to calculate BMI.  General Appearance: {Appearance:22683}  Eye Contact:  {BHH EYE CONTACT:22684}  Speech:  Clear and Coherent  Volume:  Normal  Mood:  {BHH MOOD:22306}  Affect:  {Affect (PAA):22687}  Thought Process:  Coherent  Orientation:  Full (Time, Place, and Person)  Thought Content: Logical   Suicidal Thoughts:  {ST/HT (PAA):22692}  Homicidal Thoughts:  {ST/HT (PAA):22692}  Memory:  Immediate;   Good  Judgement:  {Judgement (PAA):22694}  Insight:  {Insight (PAA):22695}  Psychomotor Activity:  Normal  Concentration:  Concentration: Good and Attention Span: Good  Recall:  Good  Fund of Knowledge: Good  Language: Good  Akathisia:  No  Handed:  Right  AIMS (if indicated): not done  Assets:  Communication Skills Desire for Improvement  ADL's:  Intact  Cognition: WNL  Sleep:  {BHH GOOD/FAIR/POOR:22877}   Screenings: GAD-7   Flowsheet Row Office Visit from 11/21/2019 in [facility]  Total GAD-7 Score 19    PHQ2-9   Flowsheet Row Office Visit from 03/21/2020 in [facility] [place] Office Visit from 11/21/2019 in [facility] [place] Office Visit from 08/22/2019 in [facility] Video Visit from 11/29/2018 in [facility] Office Visit from 09/23/2018 in [facility]   PHQ-2 Total Score 1 2 0 0 0  PHQ-9 Total Score 4 16 8  0 0       Assessment and Plan:  [name] is a 30 y.o. year old female with a history of depression, anxiety,PCOS, who presents for follow up appointment for below.   1. Panic disorder 2. MDD, recurrent in full remission She denies significant mood symptoms since the last visit.  Psychosocial stressors include work, conflict with her family, and trauma history as a teenager.  Would continue current dose of bupropion to target depression, which has been also beneficial  for anxiety.  She has no known history of seizure.  We will continue sertraline to target depression and anxiety.   2. Insomnia, unspecified type Improved.  We will continue current dose of Lunesta to target insomnia.   Plan 1.ContinueBupropion450 mg daily 2. Continuesertraline 100 mg daily 3. ContinueLunesta 3 mg at night as needed for sleep 4.Next appointment:2/24 at 9:30 for 30 mins, video  The patient demonstrates the following risk factors for suicide: Chronic risk factors for suicide include:psychiatric disorder ofanxietyand completed suicide in a family member. Acute risk factorsfor suicide include: family or marital conflict and loss (financial,  interpersonal, professional). Protective factorsfor this patient include: positive social support, positive therapeutic relationship, responsibility to others (children, family), coping skills and hope for the future. Considering these factors, the overall suicide risk at this point appears to below. Patientisappropriate for outpatient follow up.  [name], MD 06/14/2020, 1:45 PM

## 2020-06-21 ENCOUNTER — Telehealth: Payer: 59 | Admitting: Psychiatry

## 2020-06-25 ENCOUNTER — Other Ambulatory Visit: Payer: Self-pay | Admitting: Psychiatry

## 2020-06-25 ENCOUNTER — Telehealth: Payer: Self-pay

## 2020-06-25 DIAGNOSIS — G4700 Insomnia, unspecified: Secondary | ICD-10-CM

## 2020-06-25 MED ORDER — ESZOPICLONE 3 MG PO TABS: 3.0000 mg | ORAL_TABLET | Freq: Every day | ORAL | 0 refills | Status: DC

## 2020-06-25 NOTE — Telephone Encounter (Signed)
Received fax that a reill is needed   Eszopiclone 3 MG TABS Medication Date: 05/03/2020 Department: [facility] Ordering/Authorizing: [name], MD    Order Providers  Prescribing Provider Encounter Provider  [name], MD [name], MD   Outpatient Medication Detail   Disp Refills Start End   Eszopiclone 3 MG TABS 30 tablet 0 05/23/2020    Sig - Route: Take 1 tablet (3 mg total) by mouth at bedtime. Take immediately before bedtime - Oral   Sent to pharmacy as: Eszopiclone 3 MG Tab   E-Prescribing Status: Receipt confirmed by pharmacy (05/03/2020 9:59 AM EST)

## 2020-06-25 NOTE — Telephone Encounter (Signed)
Ordered

## 2020-06-25 NOTE — Telephone Encounter (Signed)
received fax requesting a refill on the eszopiclone 3mg     Eszopiclone 3 MG TABS Medication Date: 05/03/2020 Department: [facility] Ordering/Authorizing: [facility], MD    Order Providers  Prescribing Provider Encounter Provider  [name], MD [name], MD   Outpatient Medication Detail   Disp Refills Start End   Eszopiclone 3 MG TABS 30 tablet 0 05/23/2020    Sig - Route: Take 1 tablet (3 mg total) by mouth at bedtime. Take immediately before bedtime - Oral   Sent to pharmacy as: Eszopiclone 3 MG Tab   E-Prescribing Status: Receipt confirmed by pharmacy (05/03/2020 9:59 AM EST)

## 2020-06-28 NOTE — Progress Notes (Signed)
Virtual Visit via Video Note  I connected with [name] on 07/12/20 at 10:00 AM EDT by a video enabled telemedicine application and verified that I am speaking with the correct person using two identifiers.  Location: Patient: car Provider: office Persons participated in the visit- patient, provider   I discussed the limitations of evaluation and management by telemedicine and the availability of in person appointments. The patient expressed understanding and agreed to proceed.     I discussed the assessment and treatment plan with the patient. The patient was provided an opportunity to ask questions and all were answered. The patient agreed with the plan and demonstrated an understanding of the instructions.   The patient was advised to call back or seek an in-person evaluation if the symptoms worsen or if the condition fails to improve as anticipated.  I provided 25 minutes of non-face-to-face time during this encounter.   [name], MD    [facility] MD/PA/NP OP Progress Note  07/12/2020 10:35 AM [name]  MRN:  [id number]  Chief Complaint:  Chief Complaint    Follow-up; Depression     HPI:  This is a follow-up appointment for depression and insomnia.  She states that she has been stressed.  She talks about the conflict with her boss.  She was able to say no when her boss tried to get her credit of work she has done.  She feels good about this.  She also talks about her husband, who is seen a provider.  He is unable to pick up other people's feeling.  She thinks they have better communication lately.  She feels down at least a few days when she has worked on the project related to [place].  She feels hopeless about the situation.  She has depressive symptoms as in PHQ-9.  She denies SI.  She denies panic attacks.  She has middle insomnia, and fatigue during the day.  She has no known snoring.  She is willing to do sleep study as her family have this condition.     Employment:Operations analystasset in management teamfor16 months, used to be on regional support position Support:her husband Household:[name], her husband Marital status:married for 14 years, Number of children:0 Education- graduated from high school   Visit Diagnosis:    ICD-10-CM   1. Panic disorder  F41.0   2. MDD (major depressive disorder), recurrent episode, mild (HCC)  F33.0   3. Insomnia, unspecified type  G47.00 Eszopiclone 3 MG TABS    Past Psychiatric History: Please see initial evaluation for full details. I have reviewed the history. No updates at this time.     Past Medical History:  Past Medical History:  Diagnosis Date  . Asthma   . PCOS (polycystic ovarian syndrome)   . UTI (urinary tract infection)     Past Surgical History:  Procedure Laterality Date  . BREAST ENHANCEMENT SURGERY  2012  . SHOULDER SURGERY    . TONSILLECTOMY      Family Psychiatric History: Please see initial evaluation for full details. I have reviewed the history. No updates at this time.     Family History:  Family History  Problem Relation Age of Onset  . Arthritis Mother   . Heart disease Mother   . Stroke Mother   . Hypertension Mother   . Mental illness Mother   . Diabetes Mother   . Factor V Leiden deficiency Mother   . Sleep apnea Mother   . Depression Mother   .  Drug abuse Mother   . Mental illness Father   . Diabetes Father   . CAD Father   . Sleep apnea Father   . Bleeding Disorder Father        prothrombin gene mutuation  . Bipolar disorder Father   . Crohn's disease Brother   . Kidney failure Paternal Grandfather   . Suicidality Maternal Grandfather   . Bipolar disorder Paternal Aunt   . Clotting disorder Paternal Aunt   . Clotting disorder Paternal Grandmother   . Breast cancer Neg Hx   . Ovarian cancer Neg Hx   . Colon cancer Neg Hx     Social History:  Social History   Socioeconomic History  . Marital status: Married    Spouse  name: Not on file  . Number of children: Not on file  . Years of education: Not on file  . Highest education level: Not on file  Occupational History  . Not on file  Tobacco Use  . Smoking status: Never Smoker  . Smokeless tobacco: Never Used  Vaping Use  . Vaping Use: Never used  Substance and Sexual Activity  . Alcohol use: Not Currently  . Drug use: No  . Sexual activity: Yes    Birth control/protection: Pill, None  Other Topics Concern  . 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       Social Determinants of Health   Financial Resource Strain: Not on file  Food Insecurity: Not on file  Transportation Needs: Not on file  Physical Activity: Not on file  Stress: Not on file  Social Connections: Not on file    Allergies:  Allergies  Allergen Reactions  . Aspirin Anaphylaxis  . Levaquin [Levofloxacin In D5w] Hives  . Sulfa Antibiotics Other (See Comments)    Metabolic Disorder Labs: Lab Results  Component Value Date   HGBA1C 5.1 06/23/2019   No results found for: PROLACTIN Lab Results  Component Value Date   CHOL 230 (H) 01/04/2020   TRIG 66.0 01/04/2020   HDL 63.30 01/04/2020   CHOLHDL 4 01/04/2020   VLDL 13.2 01/04/2020   LDLCALC 153 (H) 01/04/2020   LDLCALC 162 (H) 06/23/2019   Lab Results  Component Value Date   TSH 0.68 06/23/2019   TSH 1.22 08/20/2018    Therapeutic Level Labs: No results found for: LITHIUM No results found for: VALPROATE No components found for:  CBMZ  Current Medications: Current Outpatient Medications  Medication Sig Dispense Refill  . buPROPion (WELLBUTRIN XL) 150 MG 24 hr tablet 450 mg daily. Take along with 300 mg tab 90 tablet 0  . buPROPion (WELLBUTRIN XL) 300 MG 24 hr tablet 450 mg daily. Take along with 150 mg tab 90 tablet 0  . [START ON 07/22/2020] Eszopiclone 3 MG TABS Take 1 tablet (3 mg total) by mouth at bedtime. Take immediately before bedtime 30 tablet  1  . sertraline (ZOLOFT) 100 MG tablet Take 1 tablet (100 mg total) by mouth at bedtime. 90 tablet 0   No current facility-administered medications for this visit.     Musculoskeletal: Strength & Muscle Tone: N/A Gait & Station: N/A Patient leans: N/A  Psychiatric Specialty Exam: Review of Systems  Psychiatric/Behavioral: Positive for decreased concentration, dysphoric mood and sleep disturbance. Negative for agitation, behavioral problems, confusion, hallucinations, self-injury and suicidal ideas. The patient is not nervous/anxious and is not hyperactive.   All other systems reviewed and are negative.  There were no vitals taken for this visit.There is no height or weight on file to calculate BMI.  General Appearance: Fairly Groomed  Eye Contact:  Good  Speech:  Clear and Coherent  Volume:  Normal  Mood:  good  Affect:  Appropriate, Congruent and euthymic  Thought Process:  Coherent  Orientation:  Full (Time, Place, and Person)  Thought Content: Logical   Suicidal Thoughts:  No  Homicidal Thoughts:  No  Memory:  Immediate;   Good  Judgement:  Good  Insight:  Good  Psychomotor Activity:  Normal  Concentration:  Concentration: Good and Attention Span: Good  Recall:  Good  Fund of Knowledge: Good  Language: Good  Akathisia:  No  Handed:  Right  AIMS (if indicated): not done  Assets:  Communication Skills Desire for Improvement  ADL's:  Intact  Cognition: WNL  Sleep:  Poor   Screenings: GAD-7   Flowsheet Row Office Visit from 11/21/2019 in [facility]  Total GAD-7 Score 19    PHQ2-9   Flowsheet Row Video Visit from 07/12/2020 in [facility] Office Visit from 03/21/2020 in [facility] Office Visit from 11/21/2019 in [facility] Office Visit from 08/22/2019 in [facility] Video Visit from 11/29/2018 in [facility] [place]  PHQ-2 Total Score 2 1 2  0 0   PHQ-9 Total Score 6 4 16 8  0    Flowsheet Row Video Visit from 07/12/2020 in [facility]  C-SSRS RISK CATEGORY No Risk       Assessment and Plan:  [name] is a 30 y.o. year old female with a history of depression, anxiety,PCOS, who presents for follow up appointment for below.   1. Panic disorder 2. MDD (major depressive disorder), recurrent episode, mild (HCC) Although she reports occasional depressive symptoms, she has been managing things fairly well.  Psychosocial stressors includes work, conflict with her family, and trauma history as a teenager.  Will continue current dose of bupropion to target depression, which has been also beneficial for anxiety.  She has no known history of seizure.  Will continue sertraline to target depression and anxiety.   3. Insomnia, unspecified type She has fatigue, middle insomnia.  Will make referral for sleep evaluation.  Will continue Lunesta at the current dose at this time for sleep.   Plan 1.ContinueBupropion450 mg daily 2. Continuesertraline 100 mg daily 3. ContinueLunesta 3 mg at night as needed for sleep - referral for sleep study 4.Next appointment: 5/10 at 11:30 for 30 mins, video  Past trials of medication: Xanax, bupropion, sertraline  The patient demonstrates the following risk factors for suicide: Chronic risk factors for suicide include:psychiatric disorder ofanxietyand completed suicide in a family member. Acute risk factorsfor suicide include: family or marital conflict and loss (financial, interpersonal, professional). Protective factorsfor this patient include: positive social support, positive therapeutic relationship, responsibility to others (children, family), coping skills and hope for the future. Considering these factors, the overall suicide risk at this point appears to below. Patientisappropriate for outpatient follow up.  Melanie Barns, MD 07/12/2020, 10:35 AM

## 2020-07-12 ENCOUNTER — Other Ambulatory Visit: Payer: Self-pay

## 2020-07-12 ENCOUNTER — Encounter: Payer: Self-pay | Admitting: Psychiatry

## 2020-07-12 ENCOUNTER — Telehealth (INDEPENDENT_AMBULATORY_CARE_PROVIDER_SITE_OTHER): Payer: 59 | Admitting: Psychiatry

## 2020-07-12 DIAGNOSIS — F330 Major depressive disorder, recurrent, mild: Secondary | ICD-10-CM | POA: Diagnosis not present

## 2020-07-12 DIAGNOSIS — G4700 Insomnia, unspecified: Secondary | ICD-10-CM | POA: Diagnosis not present

## 2020-07-12 DIAGNOSIS — F410 Panic disorder [episodic paroxysmal anxiety] without agoraphobia: Secondary | ICD-10-CM

## 2020-07-12 MED ORDER — ESZOPICLONE 3 MG PO TABS: 3.0000 mg | ORAL_TABLET | Freq: Every day | ORAL | 1 refills | Status: DC

## 2020-07-12 NOTE — Patient Instructions (Signed)
1.ContinueBupropion450 mg daily 2. Continuesertraline 100 mg daily 3. ContinueLunesta 3 mg at night as needed for sleep 4  Referral for sleep study 5.Next appointment: 5/10 at 11:30

## 2020-07-16 ENCOUNTER — Encounter: Payer: Self-pay | Admitting: Family

## 2020-07-17 ENCOUNTER — Other Ambulatory Visit: Payer: Self-pay | Admitting: Family

## 2020-07-17 DIAGNOSIS — R41840 Attention and concentration deficit: Secondary | ICD-10-CM

## 2020-08-27 NOTE — Progress Notes (Deleted)
[facility] MD/PA/NP OP Progress Note  08/27/2020 2:18 PM [name]  MRN:  [id number]  Chief Complaint:  HPI: *** Visit Diagnosis: No diagnosis found.  Past Psychiatric History: Please see initial evaluation for full details. I have reviewed the history. No updates at this time.     Past Medical History:  Past Medical History:  Diagnosis Date  . Asthma   . PCOS (polycystic ovarian syndrome)   . UTI (urinary tract infection)     Past Surgical History:  Procedure Laterality Date  . BREAST ENHANCEMENT SURGERY  2012  . SHOULDER SURGERY    . TONSILLECTOMY      Family Psychiatric History: Please see initial evaluation for full details. I have reviewed the history. No updates at this time.     Family History:  Family History  Problem Relation Age of Onset  . Arthritis Mother   . Heart disease Mother   . Stroke Mother   . Hypertension Mother   . Mental illness Mother   . Diabetes Mother   . Factor V Leiden deficiency Mother   . Sleep apnea Mother   . Depression Mother   . Drug abuse Mother   . Mental illness Father   . Diabetes Father   . CAD Father   . Sleep apnea Father   . Bleeding Disorder Father        prothrombin gene mutuation  . Bipolar disorder Father   . Crohn's disease Brother   . Kidney failure Paternal Grandfather   . Suicidality Maternal Grandfather   . Bipolar disorder Paternal Aunt   . Clotting disorder Paternal Aunt   . Clotting disorder Paternal Grandmother   . Breast cancer Neg Hx   . Ovarian cancer Neg Hx   . Colon cancer Neg Hx     Social History:  Social History   Socioeconomic History  . Marital status: Married    Spouse name: Not on file  . Number of children: Not on file  . Years of education: Not on file  . Highest education level: Not on file  Occupational History  . Not on file  Tobacco Use  . Smoking status: Never Smoker  . Smokeless tobacco: Never Used  Vaping Use  . Vaping Use: Never used  Substance and Sexual Activity  .  Alcohol use: Not Currently  . Drug use: No  . Sexual activity: Yes    Birth control/protection: Pill, None  Other Topics Concern  . 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       Social Determinants of Health   Financial Resource Strain: Not on file  Food Insecurity: Not on file  Transportation Needs: Not on file  Physical Activity: Not on file  Stress: Not on file  Social Connections: Not on file    Allergies:  Allergies  Allergen Reactions  . Aspirin Anaphylaxis  . Levaquin [Levofloxacin In D5w] Hives  . Sulfa Antibiotics Other (See Comments)    Metabolic Disorder Labs: Lab Results  Component Value Date   HGBA1C 5.1 06/23/2019   No results found for: PROLACTIN Lab Results  Component Value Date   CHOL 230 (H) 01/04/2020   TRIG 66.0 01/04/2020   HDL 63.30 01/04/2020   CHOLHDL 4 01/04/2020   VLDL 13.2 01/04/2020   LDLCALC 153 (H) 01/04/2020   LDLCALC 162 (H) 06/23/2019   Lab Results  Component Value Date   TSH 0.68 06/23/2019   TSH  1.22 08/20/2018    Therapeutic Level Labs: No results found for: LITHIUM No results found for: VALPROATE No components found for:  CBMZ  Current Medications: Current Outpatient Medications  Medication Sig Dispense Refill  . buPROPion (WELLBUTRIN XL) 150 MG 24 hr tablet 450 mg daily. Take along with 300 mg tab 90 tablet 0  . buPROPion (WELLBUTRIN XL) 300 MG 24 hr tablet 450 mg daily. Take along with 150 mg tab 90 tablet 0  . Eszopiclone 3 MG TABS Take 1 tablet (3 mg total) by mouth at bedtime. Take immediately before bedtime 30 tablet 1  . sertraline (ZOLOFT) 100 MG tablet Take 1 tablet (100 mg total) by mouth at bedtime. 90 tablet 0   No current facility-administered medications for this visit.     Musculoskeletal: Strength & Muscle Tone: N/A Gait & Station: N/A Patient leans: N/A  Psychiatric Specialty Exam: Review of Systems  There were no vitals taken for  this visit.There is no height or weight on file to calculate BMI.  General Appearance: {Appearance:22683}  Eye Contact:  {BHH EYE CONTACT:22684}  Speech:  Clear and Coherent  Volume:  Normal  Mood:  {BHH MOOD:22306}  Affect:  {Affect (PAA):22687}  Thought Process:  Coherent  Orientation:  Full (Time, Place, and Person)  Thought Content: Logical   Suicidal Thoughts:  {ST/HT (PAA):22692}  Homicidal Thoughts:  {ST/HT (PAA):22692}  Memory:  Immediate;   Good  Judgement:  {Judgement (PAA):22694}  Insight:  {Insight (PAA):22695}  Psychomotor Activity:  Normal  Concentration:  Concentration: Good and Attention Span: Good  Recall:  Good  Fund of Knowledge: Good  Language: Good  Akathisia:  No  Handed:  Right  AIMS (if indicated): not done  Assets:  Communication Skills Desire for Improvement  ADL's:  Intact  Cognition: WNL  Sleep:  {BHH GOOD/FAIR/POOR:22877}   Screenings: GAD-7   Flowsheet Row Office Visit from 11/21/2019 in [facility]  Total GAD-7 Score 19    PHQ2-9   Flowsheet Row Video Visit from 07/12/2020 in [facility] Office Visit from 03/21/2020 in [facility] [place] Office Visit from 11/21/2019 in [facility] [place] Office Visit from 08/22/2019 in [facility] [place] Video Visit from 11/29/2018 in [facility] [place]  PHQ-2 Total Score 2 1 2  0 0  PHQ-9 Total Score 6 4 16 8  0    Flowsheet Row Video [facility] 07/12/2020 in [facility]  C-SSRS RISK CATEGORY No Risk       Assessment and Plan:  [name] is a 30 y.o. year old female with a history of depression, anxiety,PCOS,, who presents for follow up appointment for below.    1. Panic disorder 2. MDD (major depressive disorder), recurrent episode, mild (HCC) Although she reports occasional depressive symptoms, she has been managing things fairly well.  Psychosocial stressors includes work,  conflict with her family, and trauma history as a teenager.  Will continue current dose of bupropion to target depression, which has been also beneficial for anxiety.  She has no known history of seizure.  Will continue sertraline to target depression and anxiety.   3. Insomnia, unspecified type She has fatigue, middle insomnia.  Will make referral for sleep evaluation.  Will continue Lunesta at the current dose at this time for sleep.   Plan 1.ContinueBupropion450 mg daily 2. Continuesertraline 100 mg daily 3. ContinueLunesta 3 mg at night as needed for sleep - referral for sleep study 4.Next appointment: 5/10 at 11:30  for 30 mins, video  Past trials of medication:Xanax, bupropion, sertraline  The patient demonstrates the following risk factors for suicide: Chronic risk factors for suicide include:psychiatric disorder ofanxietyand completed suicide in a family member. Acute risk factorsfor suicide include: family or marital conflict and loss (financial, interpersonal, professional). Protective factorsfor this patient include: positive social support, positive therapeutic relationship, responsibility to others (children, family), coping skills and hope for the future. Considering these factors, the overall suicide risk at this point appears to below. Patientisappropriate for outpatient follow up.  [name], MD 08/27/2020, 2:18 PM

## 2020-09-04 ENCOUNTER — Other Ambulatory Visit: Payer: Self-pay

## 2020-09-04 ENCOUNTER — Telehealth: Payer: Self-pay

## 2020-09-04 ENCOUNTER — Telehealth: Payer: 59 | Admitting: Psychiatry

## 2020-09-04 ENCOUNTER — Telehealth: Payer: Self-pay | Admitting: Psychiatry

## 2020-09-04 NOTE — Telephone Encounter (Signed)
Appointment - Patient left a message apologizing for missing her appointment earlier this date wth Dr. Vanetta Shawl stating she has been sick and missed it. Patient requests a call back to reschedule missed appointment.

## 2020-09-04 NOTE — Telephone Encounter (Signed)
Sent link for video visit through Epic. Patient did not sign in. Called the patient for appointment scheduled today. The patient did not answer the phone. Left voice message to contact the office ([phone]).   ?

## 2020-09-04 NOTE — Telephone Encounter (Signed)
Will notify the front desk.

## 2020-09-05 ENCOUNTER — Encounter: Payer: Self-pay | Admitting: Family

## 2020-09-06 ENCOUNTER — Other Ambulatory Visit: Payer: Self-pay

## 2020-09-06 ENCOUNTER — Telehealth (INDEPENDENT_AMBULATORY_CARE_PROVIDER_SITE_OTHER): Payer: BC Managed Care – PPO | Admitting: Psychiatry

## 2020-09-06 ENCOUNTER — Encounter: Payer: Self-pay | Admitting: Psychiatry

## 2020-09-06 DIAGNOSIS — F330 Major depressive disorder, recurrent, mild: Secondary | ICD-10-CM | POA: Diagnosis not present

## 2020-09-06 DIAGNOSIS — F410 Panic disorder [episodic paroxysmal anxiety] without agoraphobia: Secondary | ICD-10-CM

## 2020-09-06 DIAGNOSIS — G4700 Insomnia, unspecified: Secondary | ICD-10-CM

## 2020-09-06 MED ORDER — SERTRALINE HCL 100 MG PO TABS: 100.0000 mg | ORAL_TABLET | Freq: Every day | ORAL | 1 refills | Status: DC

## 2020-09-06 MED ORDER — ESZOPICLONE 3 MG PO TABS: 3.0000 mg | ORAL_TABLET | Freq: Every day | ORAL | 0 refills | Status: DC

## 2020-09-06 MED ORDER — BUPROPION HCL ER (XL) 150 MG PO TB24: ORAL_TABLET | ORAL | 1 refills | Status: DC

## 2020-09-06 MED ORDER — BUPROPION HCL ER (XL) 300 MG PO TB24: ORAL_TABLET | ORAL | 1 refills | Status: DC

## 2020-09-06 NOTE — Progress Notes (Signed)
Virtual Visit via Video Note  I connected with [name] on 09/06/20 at 10:20 AM EDT by a video enabled telemedicine application and verified that I am speaking with the correct person using two identifiers.  Location: Patient: car Provider: office Persons participated in the visit- patient, provider   I discussed the limitations of evaluation and management by telemedicine and the availability of in person appointments. The patient expressed understanding and agreed to proceed.    I discussed the assessment and treatment plan with the patient. The patient was provided an opportunity to ask questions and all were answered. The patient agreed with the plan and demonstrated an understanding of the instructions.   The patient was advised to call back or seek an in-person evaluation if the symptoms worsen or if the condition fails to improve as anticipated.  I provided 20 minutes of non-face-to-face time during this encounter.   [name], MD    [facility] MD/PA/NP OP Progress Note  09/06/2020 10:57 AM [name]  MRN:  [id number]  Chief Complaint:  Chief Complaint    Follow-up; Depression; Anxiety     HPI:  This is a follow-up appointment for depression and anxiety.  She states that her brother attempted suicide a few weeks ago.  She had "emotional lockdown "to handle things.  She felt that she should have picked up any signs on him.  He has been doing better after getting appropriate help.  She states that although it was helpful in some way that she knows what to be expected given the family history of suicide by her parents and her her grand father, she also felt scary that she almost lost her brother.  She was also physically feeling sick, which she attributes to mental stress.  Her husband and her brother, who used to work at the same company left their jobs due to toxic environment.  She has been taking lower dose of bupropion as she could not fill at the pharmacy.  She feels  comfortable to get back on this medication.  She has insomnia.  She feels fatigue.  She had gained some weight despite no change in her appetite.  She denies SI.  She has not had menstrual cycle for the past 3 months; she agrees to follow by her PCP.  TSH 1.174 - 02/2021  140 lbs Wt Readings from Last 3 Encounters:  03/21/20 139 lb 6.4 oz (63.2 kg)  11/21/19 132 lb 3.2 oz (60 kg)  08/22/19 128 lb 3.2 oz (58.2 kg)    Visit Diagnosis:    ICD-10-CM   1. Panic disorder  F41.0   2. MDD (major depressive disorder), recurrent episode, mild (HCC)  F33.0     Past Psychiatric History: Please see initial evaluation for full details. I have reviewed the history. No updates at this time.     Past Medical History:  Past Medical History:  Diagnosis Date  . Asthma   . PCOS (polycystic ovarian syndrome)   . UTI (urinary tract infection)     Past Surgical History:  Procedure Laterality Date  . BREAST ENHANCEMENT SURGERY  2012  . SHOULDER SURGERY    . TONSILLECTOMY      Family Psychiatric History: Please see initial evaluation for full details. I have reviewed the history. No updates at this time.     Family History:  Family History  Problem Relation Age of Onset  . Arthritis Mother   . Heart disease Mother   . Stroke Mother   .  Hypertension Mother   . Mental illness Mother   . Diabetes Mother   . Factor V Leiden deficiency Mother   . Sleep apnea Mother   . Depression Mother   . Drug abuse Mother   . Mental illness Father   . Diabetes Father   . CAD Father   . Sleep apnea Father   . Bleeding Disorder Father        prothrombin gene mutuation  . Bipolar disorder Father   . Crohn's disease Brother   . Kidney failure Paternal Grandfather   . Suicidality Maternal Grandfather   . Bipolar disorder Paternal Aunt   . Clotting disorder Paternal Aunt   . Clotting disorder Paternal Grandmother   . Breast cancer Neg Hx   . Ovarian cancer Neg Hx   . Colon cancer Neg Hx     Social  History:  Social History   Socioeconomic History  . Marital status: Married    Spouse name: Not on file  . Number of children: Not on file  . Years of education: Not on file  . Highest education level: Not on file  Occupational History  . Not on file  Tobacco Use  . Smoking status: Never Smoker  . Smokeless tobacco: Never Used  Vaping Use  . Vaping Use: Never used  Substance and Sexual Activity  . Alcohol use: Not Currently  . Drug use: No  . Sexual activity: Yes    Birth control/protection: Pill, None  Other Topics Concern  . 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       Social Determinants of Health   Financial Resource Strain: Not on file  Food Insecurity: Not on file  Transportation Needs: Not on file  Physical Activity: Not on file  Stress: Not on file  Social Connections: Not on file    Allergies:  Allergies  Allergen Reactions  . Aspirin Anaphylaxis  . Levaquin [Levofloxacin In D5w] Hives  . Sulfa Antibiotics Other (See Comments)    Metabolic Disorder Labs: Lab Results  Component Value Date   HGBA1C 5.1 06/23/2019   No results found for: PROLACTIN Lab Results  Component Value Date   CHOL 230 (H) 01/04/2020   TRIG 66.0 01/04/2020   HDL 63.30 01/04/2020   CHOLHDL 4 01/04/2020   VLDL 13.2 01/04/2020   LDLCALC 153 (H) 01/04/2020   LDLCALC 162 (H) 06/23/2019   Lab Results  Component Value Date   TSH 0.68 06/23/2019   TSH 1.22 08/20/2018    Therapeutic Level Labs: No results found for: LITHIUM No results found for: VALPROATE No components found for:  CBMZ  Current Medications: Current Outpatient Medications  Medication Sig Dispense Refill  . buPROPion (WELLBUTRIN XL) 150 MG 24 hr tablet 450 mg daily. Take along with 300 mg tab 90 tablet 0  . buPROPion (WELLBUTRIN XL) 300 MG 24 hr tablet 450 mg daily. Take along with 150 mg tab 90 tablet 0  . Eszopiclone 3 MG TABS Take 1 tablet (3 mg  total) by mouth at bedtime. Take immediately before bedtime 30 tablet 1  . sertraline (ZOLOFT) 100 MG tablet Take 1 tablet (100 mg total) by mouth at bedtime. 90 tablet 0   No current facility-administered medications for this visit.     Musculoskeletal: Strength & Muscle Tone: N/A Gait & Station: N/A Patient leans: N/A  Psychiatric Specialty Exam: Review of Systems  Psychiatric/Behavioral: Positive for dysphoric mood and sleep  disturbance. Negative for agitation, behavioral problems, confusion, decreased concentration, hallucinations, self-injury and suicidal ideas. The patient is nervous/anxious. The patient is not hyperactive.   All other systems reviewed and are negative.   There were no vitals taken for this visit.There is no height or weight on file to calculate BMI.  General Appearance: Fairly Groomed  Eye Contact:  Good  Speech:  Clear and Coherent  Volume:  Normal  Mood:  good  Affect:  Appropriate, Congruent and down at times  Thought Process:  Coherent and Goal Directed  Orientation:  Full (Time, Place, and Person)  Thought Content: Logical   Suicidal Thoughts:  No  Homicidal Thoughts:  No  Memory:  Immediate;   Good  Judgement:  Good  Insight:  Good  Psychomotor Activity:  Normal  Concentration:  Concentration: Good and Attention Span: Good  Recall:  Good  Fund of Knowledge: Good  Language: Good  Akathisia:  No  Handed:  Right  AIMS (if indicated): not done  Assets:  Communication Skills Desire for Improvement  ADL's:  Intact  Cognition: WNL  Sleep:  Poor   Screenings: GAD-7   Flowsheet Row [facility] [place]  Total GAD-7 Score 19    PHQ2-9   Flowsheet Row [facility] Office [facility]/2020 in [facility] [place] Office [facility] from [facility]/2019 in [facility] [place] Office Visit [facility]/2019 in [facility]  [place] Video Visit [facility]/2018 [facility] [place]  PHQ-2 Total Score 2 1 2  0 0  PHQ-9 Total Score 6 4 16 8  0    Flowsheet Row Video [facility]  C-SSRS RISK CATEGORY No Risk       Assessment and Plan:  Melanie Barnsmber C Pickney is a 30 y.o. year old female with a history of depression, anxiety,PCOS, who presents for follow up appointment for below.   1. Panic disorder 2. MDD (major depressive disorder), recurrent episode, mild (HCC) There has been slight worsening in depressive symptoms in the context of her brother recently attempted suicide.  Other psychosocial stressors includes  work, conflict with her family, and trauma history as a teenager.   She was taking lower dose of bupropion as she has not been able to refill bupropion 300 mg; we will restart this medication.  Will continue sertraline to target depression and anxiety.   3. Insomnia, unspecified type Worsened in the context of having physical illness. She has fatigue, middle insomnia.  Referral was made for sleep evaluation.   Will continue Lunesta at the current dose to target insomnia.   This clinician has discussed the side effect associated with medication prescribed during this encounter. Please refer to notes in the previous encounters for more details.   Plan 1.Restart Bupropion450 mg daily 2. Continuesertraline 100 mg daily 3. ContinueLunesta 3 mg at night as needed for sleep - referred for sleep study 4.Next appointment: 6/21 at 10 AM for 30 mins, video 5. [facility]  Past trials of medication:Xanax, bupropion, sertraline  The patient demonstrates the following risk factors for suicide: Chronic risk factors for suicide include:psychiatric disorder ofanxietyand completed suicide in a family member. Acute risk factorsfor suicide include: family or marital conflict and loss (financial, interpersonal,  professional). Protective factorsfor this patient include: positive social support, positive therapeutic relationship, responsibility to others (children, family), coping skills and hope for the future. Considering  these factors, the overall suicide risk at this point appears to below. Patientisappropriate for outpatient follow up.  [name], MD 09/06/2020, 10:57 AM

## 2020-09-06 NOTE — Patient Instructions (Addendum)
1.Restart Bupropion450 mg daily 2. Continuesertraline 100 mg daily 3. ContinueLunesta 3 mg at night as needed for sleep 4.Next appointment:6/21 at 10 AM  5. Referral was made for evaluation of sleep apnea 6. Please check this link and contact for therapy referral (psychodynamic/psychoanalytic psychotherapy). Please contact our office if you have difficulty in finding a therapist.  [email or web address]

## 2020-09-12 ENCOUNTER — Telehealth: Payer: Self-pay | Admitting: *Deleted

## 2020-09-12 NOTE — Telephone Encounter (Signed)
Please place future orders for lab appt.  

## 2020-09-14 ENCOUNTER — Other Ambulatory Visit: Payer: Self-pay

## 2020-09-14 DIAGNOSIS — E7800 Pure hypercholesterolemia, unspecified: Secondary | ICD-10-CM

## 2020-09-14 NOTE — Telephone Encounter (Signed)
I have cancelled appointment & LM asking patient to please call me back.

## 2020-09-14 NOTE — Telephone Encounter (Signed)
Patient called back & scheduled for lipid panel. Cholesterol had been high last year & needed to be rechecked. I have ordered for patient.

## 2020-09-14 NOTE — Telephone Encounter (Signed)
[name], call pt  She has upcoming lab appt 09/18/20 and I wanted to reach out as I dont have any lab orders and not sure what labs were needed. No mention of labs during our last OV 02/2020  Does she need follow up with me as well?

## 2020-09-18 ENCOUNTER — Other Ambulatory Visit: Payer: Managed Care, Other (non HMO)

## 2020-09-19 ENCOUNTER — Other Ambulatory Visit: Payer: Self-pay

## 2020-09-19 ENCOUNTER — Other Ambulatory Visit (INDEPENDENT_AMBULATORY_CARE_PROVIDER_SITE_OTHER): Payer: BC Managed Care – PPO

## 2020-09-19 DIAGNOSIS — E7800 Pure hypercholesterolemia, unspecified: Secondary | ICD-10-CM

## 2020-09-19 LAB — LIPID PANEL
Cholesterol: 206 mg/dL — ABNORMAL HIGH (ref 0–200)
HDL: 56.5 mg/dL (ref >39.00)
LDL Cholesterol: 135 mg/dL — ABNORMAL HIGH (ref 0–99)
NonHDL: 149.56
Total CHOL/HDL Ratio: 4
Triglycerides: 75 mg/dL (ref 0.0–149.0)
VLDL: 15 mg/dL (ref 0.0–40.0)

## 2020-10-05 DIAGNOSIS — Z3149 Encounter for other procreative investigation and testing: Secondary | ICD-10-CM | POA: Diagnosis not present

## 2020-10-05 DIAGNOSIS — E282 Polycystic ovarian syndrome: Secondary | ICD-10-CM | POA: Diagnosis not present

## 2020-10-05 DIAGNOSIS — N913 Primary oligomenorrhea: Secondary | ICD-10-CM | POA: Diagnosis not present

## 2020-10-05 DIAGNOSIS — N926 Irregular menstruation, unspecified: Secondary | ICD-10-CM | POA: Diagnosis not present

## 2020-10-13 NOTE — Progress Notes (Signed)
Virtual Visit via Video Note  I connected with [name] on 10/16/20 at 10:00 AM EDT by a video enabled telemedicine application and verified that I am speaking with the correct person using two identifiers.  Location: Patient: car Provider: office Persons participated in the visit- patient, provider    I discussed the limitations of evaluation and management by telemedicine and the availability of in person appointments. The patient expressed understanding and agreed to proceed.    I discussed the assessment and treatment plan with the patient. The patient was provided an opportunity to ask questions and all were answered. The patient agreed with the plan and demonstrated an understanding of the instructions.   The patient was advised to call back or seek an in-person evaluation if the symptoms worsen or if the condition fails to improve as anticipated.  I provided 17 minutes of non-face-to-face time during this encounter.   [name], MD    [facility] MD/PA/NP OP Progress Note  10/16/2020 10:37 AM [name]  MRN:  [id number]  Chief Complaint:  Chief Complaint   Follow-up; Anxiety; Depression    HPI:  This is a follow-up appointment for depression and anxiety.  She states that she has been feeling overwhelmed.  Her father was recently admitted for knee infection.  She drives an hour to get there to make sure he takes medication as her mother is also disabled from stroke.  She goes there twice a day.  Her brother has not been able to help them as he is also struggling.  Although she was contacted by her sister-in-law for her brother, she declined as she has nothing else to give.  She almost had a panic attack when she received a call from her father that he will be admitted.  She was sobbing for a while.  Although her husband has been helping her parents during the day, he is also struggling with depression.  She feels getting burden from others.  Although she has not contacted  for a therapy referral, she is willing to do so.  Although she sleeps better, she continues to have middle insomnia, daytime fatigue and snoring.  She has difficulty in concentration.  She feels anxious and tense, although she thinks medication has been helping her and she has been able to keep them in control.  She has decreased appetite, although she makes sure to eat regularly.  She denies SI.   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 MDD (major depressive disorder), recurrent episode, mild (HCC)  F33.0     2. Insomnia, unspecified type  G47.00 Eszopiclone 3 MG TABS    3. Panic disorder  F41.0 sertraline (ZOLOFT) 100 MG tablet      Past Psychiatric History: Please see initial evaluation for full details. I have reviewed the history. No updates at this time.     Past Medical History:  Past Medical History:  Diagnosis Date   Asthma    PCOS (polycystic ovarian syndrome)    UTI (urinary tract infection)     Past Surgical History:  Procedure Laterality Date   BREAST ENHANCEMENT SURGERY  2012   SHOULDER SURGERY     TONSILLECTOMY      Family Psychiatric History: Please see  initial evaluation for full details. I have reviewed the history. No updates at this time.     Family History:  Family History  Problem Relation Age of Onset   Arthritis Mother    Heart disease Mother    Stroke Mother    Hypertension Mother    Mental illness Mother    Diabetes Mother    Factor V Leiden deficiency Mother    Sleep apnea Mother    Depression Mother    Drug abuse Mother    Mental illness Father    Diabetes Father    CAD Father    Sleep apnea Father    Bleeding Disorder Father        prothrombin gene mutuation   Bipolar disorder  Father    Crohn's disease Brother    Kidney failure Paternal Grandfather    Suicidality Maternal Grandfather    Bipolar disorder Paternal Aunt    Clotting disorder Paternal Aunt    Clotting disorder Paternal Grandmother    Breast cancer Neg Hx    Ovarian cancer Neg Hx    Colon cancer Neg Hx     Social History:  Social History   Socioeconomic History   Marital status: Married    Spouse name: Not on file   Number of children: Not on file   Years of education: Not on file   Highest education level: Not on file  Occupational History   Not on file  Tobacco Use   Smoking status: Never   Smokeless tobacco: Never  Vaping Use   Vaping Use: Never used  Substance and Sexual Activity   Alcohol use: Not Currently   Drug use: No   Sexual activity: Yes    Birth control/protection: Pill, None  Other Topics Concern   Not on file  Social History Narrative   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       Social Determinants of Health   Financial Resource Strain: Not on file  Food Insecurity: Not on file  Transportation Needs: Not on file  Physical Activity: Not on file  Stress: Not on file  Social Connections: Not on file    Allergies:  Allergies  Allergen Reactions   Aspirin Anaphylaxis   Levaquin [Levofloxacin In D5w] Hives   Sulfa Antibiotics Other (See Comments)    Metabolic Disorder Labs: Lab Results  Component Value Date   HGBA1C 5.1 06/23/2019   No results found for: PROLACTIN Lab Results  Component Value Date   CHOL 206 (H) 09/19/2020   TRIG 75.0 09/19/2020   HDL 56.50 09/19/2020   CHOLHDL 4 09/19/2020   VLDL 15.0 09/19/2020   LDLCALC 135 (H) 09/19/2020   LDLCALC 153 (H) 01/04/2020   Lab Results  Component Value Date   TSH 0.68 06/23/2019   TSH 1.22 08/20/2018    Therapeutic Level Labs: No results found for: LITHIUM No results found for: VALPROATE No components found for:  CBMZ  Current Medications: Current Outpatient  Medications  Medication Sig Dispense Refill   buPROPion (WELLBUTRIN XL) 150 MG 24 hr tablet 450 mg daily. Take along with 300 mg tab 30 tablet 2   buPROPion (WELLBUTRIN XL) 300 MG 24 hr tablet 450 mg daily. Take along with 150 mg tab 30 tablet 2   [START ON 10/24/2020] Eszopiclone 3 MG TABS Take 1 tablet (3 mg total) by mouth at bedtime. Take immediately before bedtime 30 tablet 0   [START ON 11/06/2020] sertraline (ZOLOFT) 100 MG tablet Take 1  tablet (100 mg total) by mouth at bedtime. 30 tablet 1   No current facility-administered medications for this visit.     Musculoskeletal: Strength & Muscle Tone:  N/A Gait & Station:  N/A Patient leans: N/A  Psychiatric Specialty Exam: Review of Systems  Psychiatric/Behavioral:  Positive for decreased concentration, dysphoric mood and sleep disturbance. Negative for agitation, behavioral problems, confusion, hallucinations, self-injury and suicidal ideas. The patient is nervous/anxious. The patient is not hyperactive.   All other systems reviewed and are negative.  There were no vitals taken for this visit.There is no height or weight on file to calculate BMI.  General Appearance: Fairly Groomed  Eye Contact:  Good  Speech:  Clear and Coherent  Volume:  Normal  Mood:  Anxious  Affect:  Appropriate, Congruent, and fatigue  Thought Process:  Coherent  Orientation:  Full (Time, Place, and Person)  Thought Content: Logical   Suicidal Thoughts:  No  Homicidal Thoughts:  No  Memory:  Immediate;   Good  Judgement:  Good  Insight:  Good  Psychomotor Activity:  Normal  Concentration:  Concentration: Good and Attention Span: Good  Recall:  Good  Fund of Knowledge: Good  Language: Good  Akathisia:  No  Handed:  Right  AIMS (if indicated): not done  Assets:  Communication Skills Desire for Improvement  ADL's:  Intact  Cognition: WNL  Sleep:  Poor   Screenings: GAD-7    Flowsheet Row [facility]  [place]  Total GAD-7 Score 19      PHQ2-9    Flowsheet Row [facility] [place] [facility] [place] Office [facility] [place] Video Visit from 11/29/2018 in [facility] [place]  PHQ-2 Total Score 2 1 2  0 0  PHQ-9 Total Score 6 4 16 8  0      Flowsheet Row Video [facility]12/2020 [facility]  C-SSRS RISK CATEGORY No Risk        Assessment and Plan:  Melanie Barnsmber C Talley is a 30 y.o. year old female with a history of depression, anxiety, PCOS, who presents for follow up appointment for below.   1. Panic disorder 2. MDD (major depressive disorder), recurrent episode, mild (HCC) Although she continues to report anxiety and depressive symptoms, she has been handling things relatively well since up titration of bupropion.  Psychosocial stressors includes recent deterioration of her father's health/being a caregiver of him, her brother, who recently attempted suicide. Other psychosocial stressors includes  work, conflict with her family, and trauma history as a teenager.  Although she may benefit from up titration of sertraline, she would like to stay on the current dose due to concern of drowsiness.  Will continue sertraline at the current dose to target anxiety and depression; will consider cross tapering to another antidepressant if any worsening in her mood symptoms.  Will continue bupropion to target depression.   3 Insomnia She continues to have middle insomnia, daytime fatigue and snoring.  Will make referral again for evaluation of sleep apnea.  Will continue Lunesta to target insomnia.   This clinician has discussed the side effect associated with medication prescribed during this encounter. Please refer to notes in the previous encounters for more  details.     Plan Continue  Bupropion 450 mg daily 2. Continue sertraline 100 mg at  night  3. Continue Lunesta 3 mg at night as needed for sleep - referred for sleep study 4. Next appointment:7/20 at 2 PM for 30 mins, video 5. Copy link to [facility] for psychodynamic therapy    Past trials of medication: Xanax, bupropion, sertraline   The patient demonstrates the following risk factors for suicide: Chronic risk factors for suicide include: psychiatric disorder of anxiety and completed suicide in a family member. Acute risk factors for suicide include: family or marital conflict and loss (financial, interpersonal, professional). Protective factors for this patient include: positive social support, positive therapeutic relationship, responsibility to others (children, family), coping skills and hope for the future. Considering these factors, the overall suicide risk at this point appears to be low. Patient is appropriate for outpatient follow up.       [name], MD 10/16/2020, 10:37 AM

## 2020-10-16 ENCOUNTER — Encounter: Payer: Self-pay | Admitting: Psychiatry

## 2020-10-16 ENCOUNTER — Other Ambulatory Visit: Payer: Self-pay

## 2020-10-16 ENCOUNTER — Telehealth (INDEPENDENT_AMBULATORY_CARE_PROVIDER_SITE_OTHER): Payer: BC Managed Care – PPO | Admitting: Psychiatry

## 2020-10-16 DIAGNOSIS — F410 Panic disorder [episodic paroxysmal anxiety] without agoraphobia: Secondary | ICD-10-CM | POA: Diagnosis not present

## 2020-10-16 DIAGNOSIS — G4700 Insomnia, unspecified: Secondary | ICD-10-CM

## 2020-10-16 DIAGNOSIS — F330 Major depressive disorder, recurrent, mild: Secondary | ICD-10-CM

## 2020-10-16 MED ORDER — BUPROPION HCL ER (XL) 300 MG PO TB24: ORAL_TABLET | ORAL | 2 refills | Status: DC

## 2020-10-16 MED ORDER — ESZOPICLONE 3 MG PO TABS: 3.0000 mg | ORAL_TABLET | Freq: Every day | ORAL | 0 refills | Status: DC

## 2020-10-16 MED ORDER — SERTRALINE HCL 100 MG PO TABS: 100.0000 mg | ORAL_TABLET | Freq: Every day | ORAL | 1 refills | Status: DC

## 2020-10-16 MED ORDER — BUPROPION HCL ER (XL) 150 MG PO TB24: ORAL_TABLET | ORAL | 2 refills | Status: DC

## 2020-10-16 NOTE — Patient Instructions (Addendum)
Continue  Bupropion 450 mg daily 2. Continue sertraline 100 mg at night  3. Continue Lunesta 3 mg at night as needed for sleep 4. Next appointment:7/20 at 2 PM 5. Please check this following link and contact for therapy referral (psychodynamic/psychoanalytic psychotherapy). Please contact our office if you have difficulty in finding a therapist.  [email or web address]

## 2020-10-17 DIAGNOSIS — F411 Generalized anxiety disorder: Secondary | ICD-10-CM | POA: Diagnosis not present

## 2020-10-17 DIAGNOSIS — F909 Attention-deficit hyperactivity disorder, unspecified type: Secondary | ICD-10-CM | POA: Diagnosis not present

## 2020-11-12 NOTE — Progress Notes (Deleted)
BH MD/PA/NP OP Progress Note  11/12/2020 2:50 PM [name]  MRN:  [id number]  Chief Complaint:  HPI: *** Visit Diagnosis: No diagnosis found.  Past Psychiatric History: Please see initial evaluation for full details. I have reviewed the history. No updates at this time.     Past Medical History:  Past Medical History:  Diagnosis Date   Asthma    PCOS (polycystic ovarian syndrome)    UTI (urinary tract infection)     Past Surgical History:  Procedure Laterality Date   BREAST ENHANCEMENT SURGERY  2012   SHOULDER SURGERY     TONSILLECTOMY      Family Psychiatric History: Please see initial evaluation for full details. I have reviewed the history. No updates at this time.     Family History:  Family History  Problem Relation Age of Onset   Arthritis Mother    Heart disease Mother    Stroke Mother    Hypertension Mother    Mental illness Mother    Diabetes Mother    Factor V Leiden deficiency Mother    Sleep apnea Mother    Depression Mother    Drug abuse Mother    Mental illness Father    Diabetes Father    CAD Father    Sleep apnea Father    Bleeding Disorder Father        prothrombin gene mutuation   Bipolar disorder Father    Crohn's disease Brother    Kidney failure Paternal Grandfather    Suicidality Maternal Grandfather    Bipolar disorder Paternal Aunt    Clotting disorder Paternal Aunt    Clotting disorder Paternal Grandmother    Breast cancer Neg Hx    Ovarian cancer Neg Hx    Colon cancer Neg Hx     Social History:  Social History   Socioeconomic History   Marital status: Married    Spouse name: Not on file   Number of children: Not on file   Years of education: Not on file   Highest education level: Not on file  Occupational History   Not on file  Tobacco Use   Smoking status: Never   Smokeless tobacco: Never  Vaping Use   Vaping Use: Never used  Substance and Sexual Activity   Alcohol use: Not Currently   Drug use: No   Sexual  activity: Yes    Birth control/protection: Pill, None  Other Topics Concern   Not on file  Social History Narrative   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       Social Determinants of Health   Financial Resource Strain: Not on file  Food Insecurity: Not on file  Transportation Needs: Not on file  Physical Activity: Not on file  Stress: Not on file  Social Connections: Not on file    Allergies:  Allergies  Allergen Reactions   Aspirin Anaphylaxis   Levaquin [Levofloxacin In D5w] Hives   Sulfa Antibiotics Other (See Comments)    Metabolic Disorder Labs: Lab Results  Component Value Date   HGBA1C 5.1 06/23/2019   No results found for: PROLACTIN Lab Results  Component Value Date   CHOL 206 (H) 09/19/2020   TRIG 75.0 09/19/2020   HDL 56.50 09/19/2020   CHOLHDL 4 09/19/2020   VLDL 15.0 09/19/2020   LDLCALC 135 (H) 09/19/2020   LDLCALC 153 (H) 01/04/2020   Lab Results  Component Value Date   TSH 0.68 06/23/2019   TSH 1.22 08/20/2018  Therapeutic Level Labs: No results found for: LITHIUM No results found for: VALPROATE No components found for:  CBMZ  Current Medications: Current Outpatient Medications  Medication Sig Dispense Refill   buPROPion (WELLBUTRIN XL) 150 MG 24 hr tablet 450 mg daily. Take along with 300 mg tab 30 tablet 2   buPROPion (WELLBUTRIN XL) 300 MG 24 hr tablet 450 mg daily. Take along with 150 mg tab 30 tablet 2   Eszopiclone 3 MG TABS Take 1 tablet (3 mg total) by mouth at bedtime. Take immediately before bedtime 30 tablet 0   sertraline (ZOLOFT) 100 MG tablet Take 1 tablet (100 mg total) by mouth at bedtime. 30 tablet 1   No current facility-administered medications for this visit.     Musculoskeletal: Strength & Muscle Tone:  N/A Gait & Station:  N/A Patient leans: N/A  Psychiatric Specialty Exam: Review of Systems  There were no vitals taken for this visit.There is no height or weight on file to calculate  BMI.  General Appearance: {Appearance:22683}  Eye Contact:  Good  Speech:  Clear and Coherent  Volume:  Normal  Mood:  {BHH MOOD:22306}  Affect:  {Affect (PAA):22687}  Thought Process:  Coherent  Orientation:  Full (Time, Place, and Person)  Thought Content: Logical   Suicidal Thoughts:  {ST/HT (PAA):22692}  Homicidal Thoughts:  {ST/HT (PAA):22692}  Memory:  Immediate;   Good  Judgement:  {Judgement (PAA):22694}  Insight:  {Insight (PAA):22695}  Psychomotor Activity:  Normal  Concentration:  Concentration: Good and Attention Span: Good  Recall:  Good  Fund of Knowledge: Good  Language: Good  Akathisia:  No  Handed:  Right  AIMS (if indicated): not done  Assets:  Communication Skills Desire for Improvement  ADL's:  Intact  Cognition: WNL  Sleep:  {BHH GOOD/FAIR/POOR:22877}   Screenings: GAD-7    Flowsheet Row Office Visit from 11/21/2019 in [facility] [place]  Total GAD-7 Score 19      PHQ2-9    Flowsheet Row Video Visit [facility]/12/2020 in [facility] Office Visit from 03/21/2020 in [place]  [facility] [place] Office Visit from 11/21/2019 in [facility] [place] Office Visit from 08/22/2019 in [facility] [place] Video Visit from 11/29/2018 in [facility] [place]  PHQ-2 Total Score 2 1 2  0 0  PHQ-9 Total Score 6 4 16 8  0      Flowsheet Row Video Visit from 07/12/2020 in [facility]  C-SSRS RISK CATEGORY No Risk        Assessment and Plan:  [name] is a 30 y.o. year old female with a history of depression, anxiety, PCOS , who presents for follow up appointment for below.    1. Panic disorder 2. MDD (major depressive disorder), recurrent episode, mild (HCC) Although she continues to report anxiety and depressive symptoms, she has been handling things relatively well since up titration of bupropion.  Psychosocial stressors includes recent deterioration of  her father's health/being a caregiver of him, her brother, who recently attempted suicide. Other psychosocial stressors includes  work, conflict with her family, and trauma history as a teenager.  Although she may benefit from up titration of sertraline, she would like to stay on the current dose due to concern of drowsiness.  Will continue sertraline at the current dose to target anxiety and depression; will consider cross tapering to another antidepressant if any worsening in her mood symptoms.  Will continue bupropion to target depression.   3 Insomnia She  continues to have middle insomnia, daytime fatigue and snoring.  Will make referral again for evaluation of sleep apnea.  Will continue Lunesta to target insomnia.    This clinician has discussed the side effect associated with medication prescribed during this encounter. Please refer to notes in the previous encounters for more details.     Plan Continue  Bupropion 450 mg daily 2. Continue sertraline 100 mg at night  3. Continue Lunesta 3 mg at night as needed for sleep - referred for sleep study 4. Next appointment:7/20 at 2 PM for 30 mins, video 5. Copy link to [facility] for psychodynamic therapy    Past trials of medication: Xanax, bupropion, sertraline   The patient demonstrates the following risk factors for suicide: Chronic risk factors for suicide include: psychiatric disorder of anxiety and completed suicide in a family member. Acute risk factors for suicide include: family or marital conflict and loss (financial, interpersonal, professional). Protective factors for this patient include: positive social support, positive therapeutic relationship, responsibility to others (children, family), coping skills and hope for the future. Considering these factors, the overall suicide risk at this point appears to be low. Patient is appropriate for outpatient follow up.       [name], MD 11/12/2020, 2:50 PM

## 2020-11-13 IMAGING — CT CT ABD-PELV W/ CM
2 of 4 series · 15 of 46 positions shown, 17 images · IV contrast (omnipaque)
Comparison: 04/04/2018

CLINICAL DATA: Intermittent lower abdominal pain with constipation.
Abdominal bloating. Nausea and vomiting with certain foods. 45 pound
unintentional weight loss.

EXAM:
CT ABDOMEN AND PELVIS WITH CONTRAST
TECHNIQUE: Multidetector CT imaging of the abdomen and pelvis was performed
using the standard protocol following bolus administration of
intravenous contrast.
CONTRAST:  75mL OMNIPAQUE IOHEXOL 300 MG/ML  SOLN

[Series 2: abd pelvis 5.00 · axial · 0.61mm/px · z∈[-1532,-1147]mm · 12 of 92 slices shown, 14 images]
[im 8/92  soft-tissue]
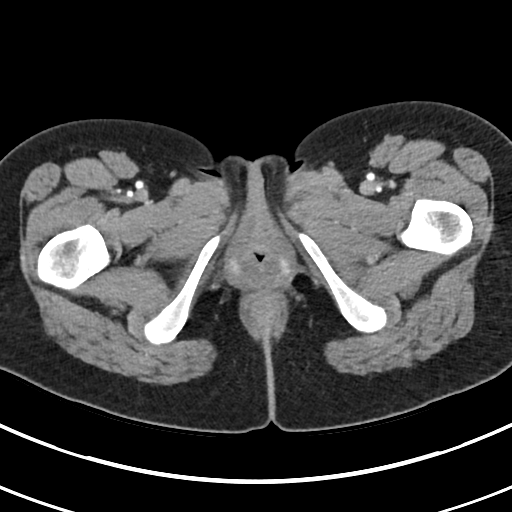
[im 8/92  bone]
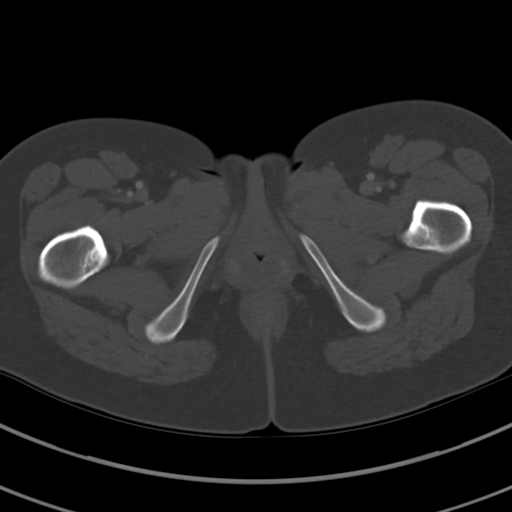
[im 15/92  soft-tissue]
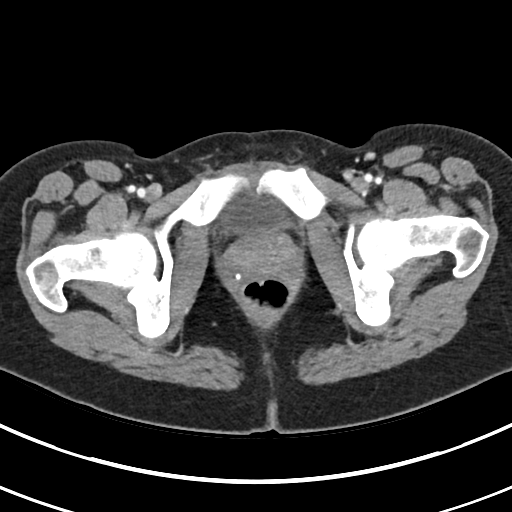
[im 22/92  soft-tissue]
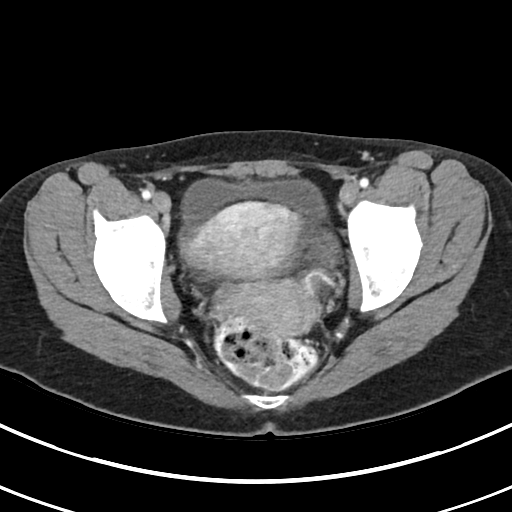
[im 29/92  soft-tissue]
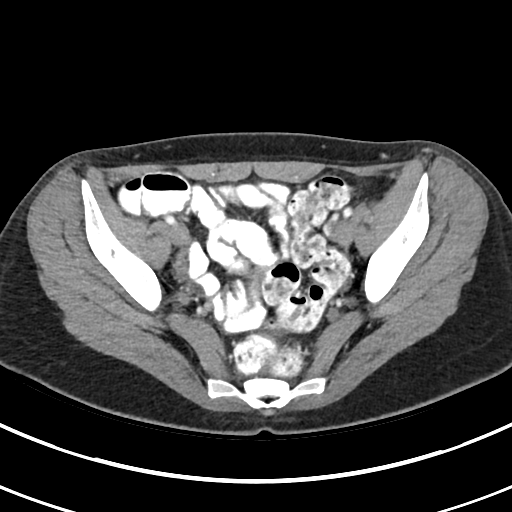
[im 36/92  soft-tissue]
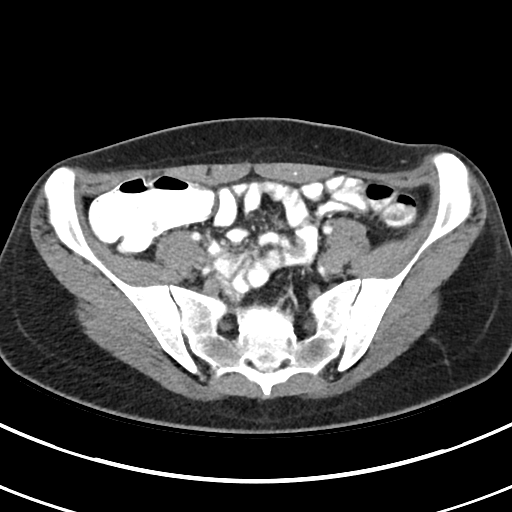
[im 43/92  soft-tissue]
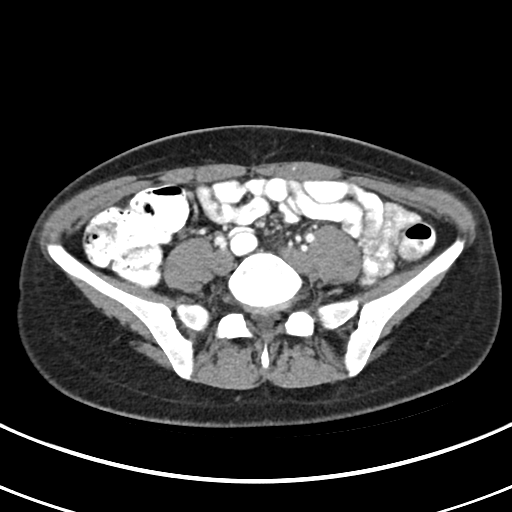
[im 50/92  soft-tissue]
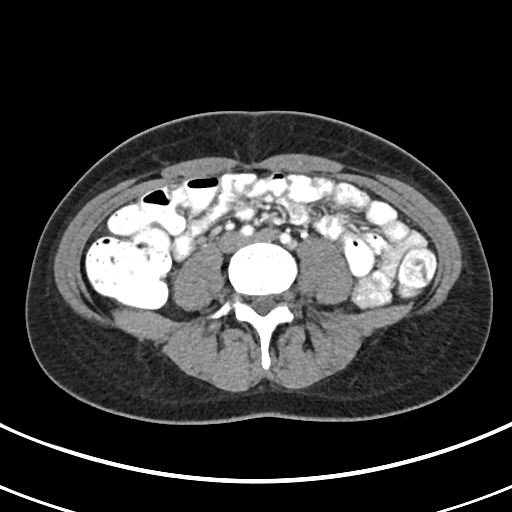
[im 57/92  soft-tissue]
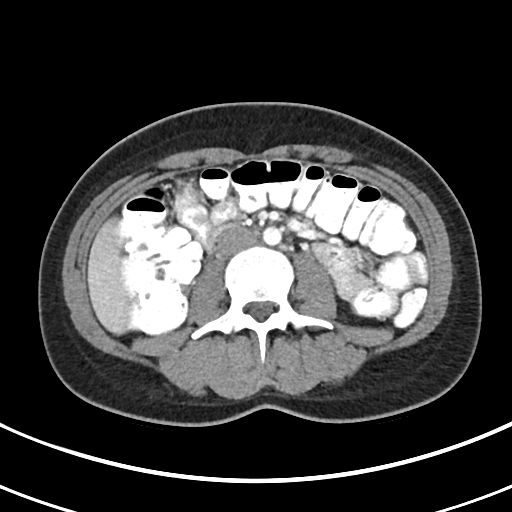
[im 64/92  soft-tissue]
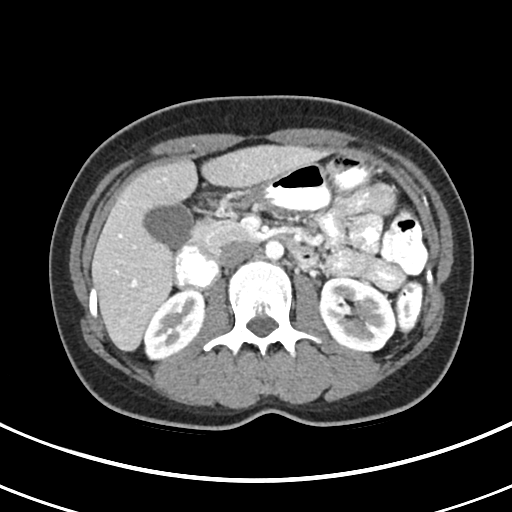
[im 64/92  bone]
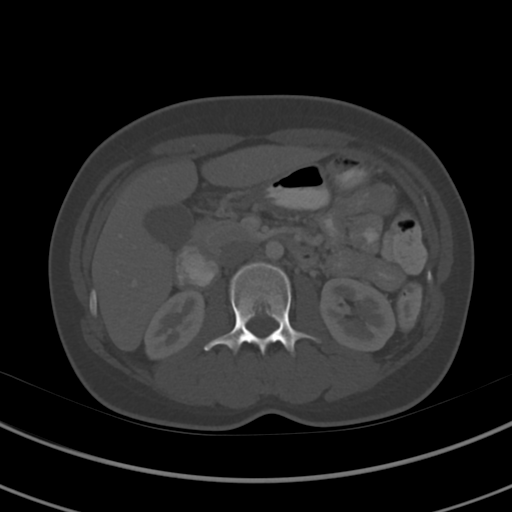
[im 71/92  soft-tissue]
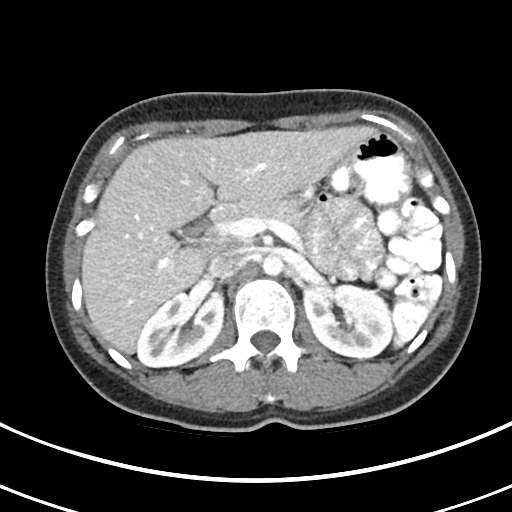
[im 78/92  soft-tissue]
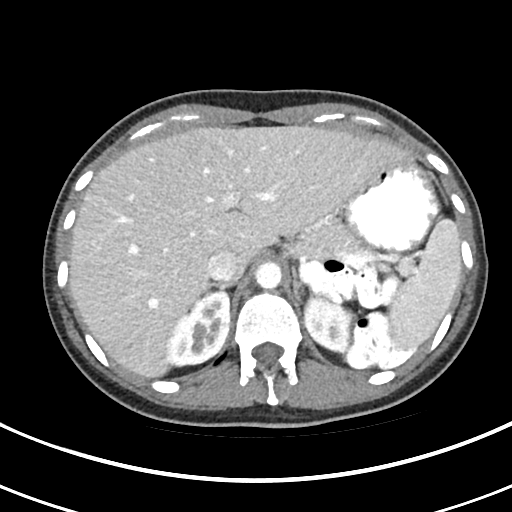
[im 85/92  soft-tissue]
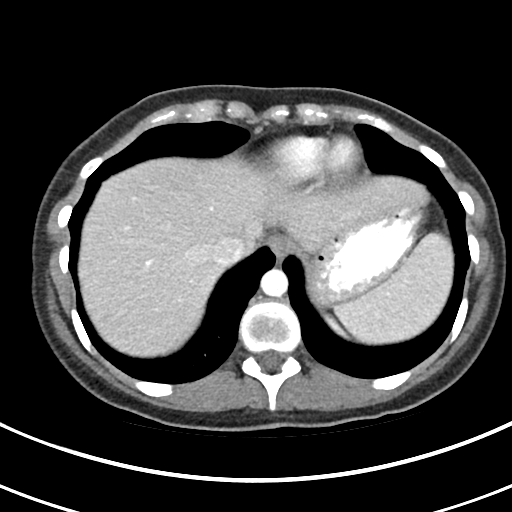

[Series 4: coronals abd pelvis 2.00 cor · coronal · 0.61mm/px · 3 of 109 slices shown]
[im 37/109  soft-tissue]
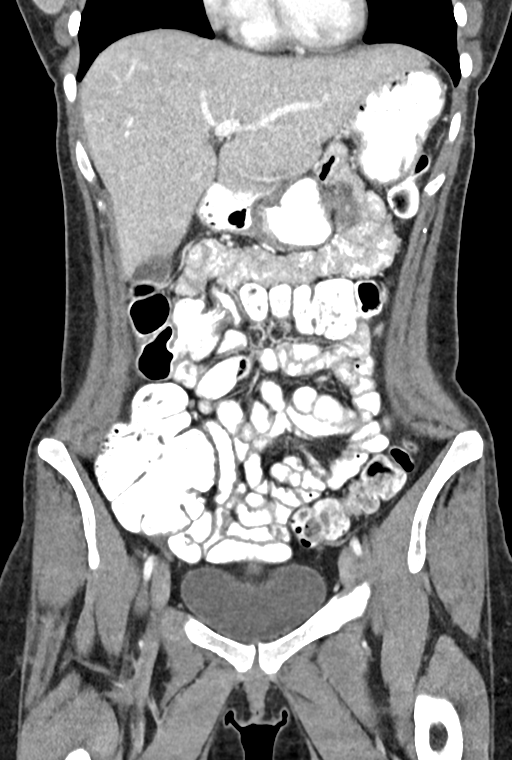
[im 49/109  soft-tissue]
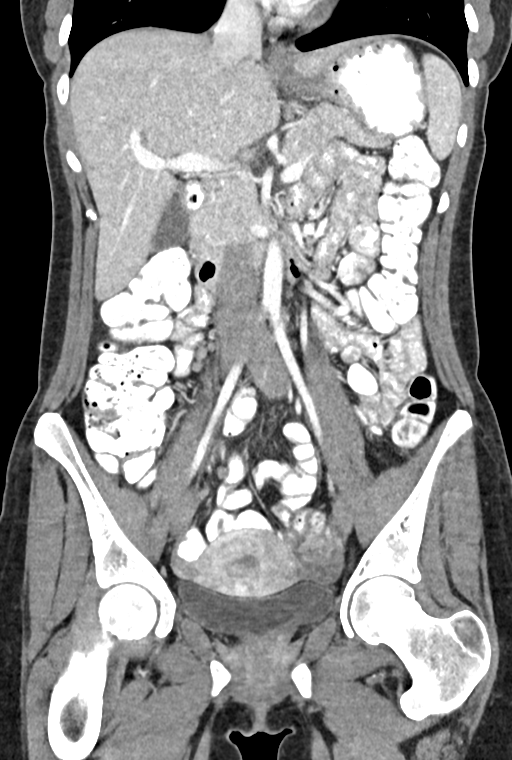
[im 61/109  soft-tissue]
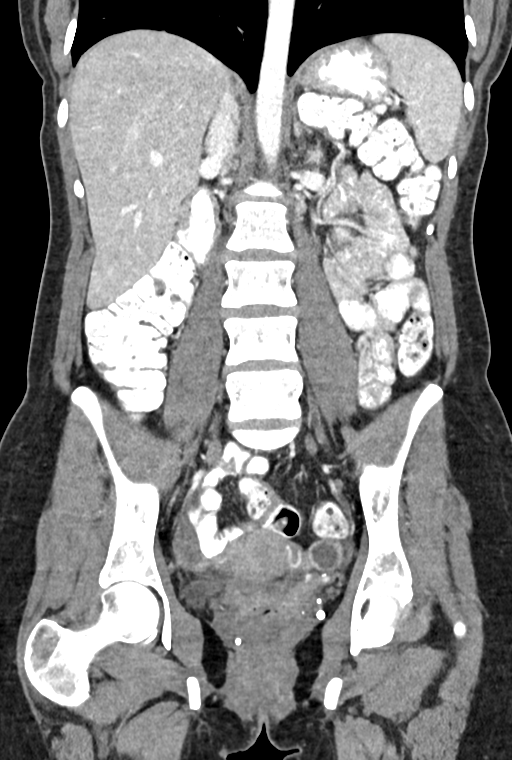

[15 of 46 positions shown; findings below may reference images not displayed]

FINDINGS: Lower chest: Unremarkable.

Hepatobiliary: No suspicious focal abnormality within the liver
parenchyma. There is no evidence for gallstones, gallbladder wall
thickening, or pericholecystic fluid. No intrahepatic or
extrahepatic biliary dilation.

Pancreas: No focal mass lesion. No dilatation of the main duct. No
intraparenchymal cyst. No peripancreatic edema.

Spleen: No splenomegaly. No focal mass lesion.

Adrenals/Urinary Tract: No adrenal nodule or mass. Kidneys No
evidence for hydroureter. The urinary bladder appears normal for the
degree of distention.

Stomach/Bowel: Stomach is unremarkable. No gastric wall thickening.
No evidence of outlet obstruction. Duodenum is normally positioned
as is the ligament of Treitz. No small bowel wall thickening. No
small bowel dilatation. The terminal ileum is normal. The appendix
is best seen on coronal images and is unremarkable.

Vascular/Lymphatic: No abdominal aortic aneurysm. No abdominal
aortic atherosclerotic calcification. There is no gastrohepatic or
hepatoduodenal ligament lymphadenopathy. No retroperitoneal or
mesenteric lymphadenopathy. No pelvic sidewall lymphadenopathy.

Reproductive: The uterus is unremarkable. Dominant follicle noted
left ovary.

Other: Trace free fluid noted in the cul-de-sac, likely physiologic.

Musculoskeletal: No worrisome lytic or sclerotic osseous
abnormality.
IMPRESSION: 1. No acute findings in the abdomen or pelvis. Specifically, no
findings to explain the patient's history of abdominal pain and
weight loss.
2. Trace free fluid in the cul-de-sac, likely physiologic.

## 2020-11-14 ENCOUNTER — Telehealth: Payer: BC Managed Care – PPO | Admitting: Psychiatry

## 2020-11-14 ENCOUNTER — Other Ambulatory Visit: Payer: Self-pay

## 2020-11-19 NOTE — Progress Notes (Signed)
Virtual Visit via Video Note  I connected with Melanie BarnsAmber C Woods on 11/22/20 at 10:30 AM EDT by a video enabled telemedicine application and verified that I am speaking with the correct person using two identifiers.  Location: Patient: car Provider: office Persons participated in the visit- patient, provider    I discussed the limitations of evaluation and management by telemedicine and the availability of in person appointments. The patient expressed understanding and agreed to proceed.   I discussed the assessment and treatment plan with the patient. The patient was provided an opportunity to ask questions and all were answered. The patient agreed with the plan and demonstrated an understanding of the instructions.   The patient was advised to call back or seek an in-person evaluation if the symptoms worsen or if the condition fails to improve as anticipated.  I provided 15 minutes of non-face-to-face time during this encounter.   [name] Dusten Ellinwood, MD    [facility]  11/22/2020 4:02 PM [name]  MRN:  [id number]  Chief Complaint:  Chief Complaint   Follow-up; Depression    HPI:  This is a follow-up appointment for depression.  She states that she feels tired.  She continues to take care of her father since he had knee infection as well as her mother.  Her brother there has been doing better.  He sees a therapist, and has started a job.  Her husband is still unemployed.  He was panicked when he stole a recent bank account.  She feels that she has been taking care of to the family emotionally, physically and financially.  She also has been stressed at work due to having a Writernew manager.  She has been trying to see what she can do to be better.  She has initial and middle insomnia.  She feels fatigue.  Her weight fluctuates lately despite no change in appetite.  She feels fatigue, tense and anxious.  She denies panic attacks.  She denies SI.  She has an upcoming  appointment with her therapist.  She is willing to try higher dose of sertraline at this time.   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 Panic disorder  F41.0 sertraline (ZOLOFT) 100 MG tablet    2. MDD (major depressive disorder), recurrent episode, mild (HCC)  F33.0     3. Insomnia, unspecified type  G47.00 Eszopiclone 3 MG TABS      Past Psychiatric History: Please see initial evaluation for full details. I have reviewed the history. No updates at this time.     Past Medical History:  Past Medical History:  Diagnosis Date   Asthma    PCOS (polycystic ovarian syndrome)    UTI (urinary tract infection)     Past Surgical History:  Procedure Laterality Date   BREAST ENHANCEMENT SURGERY  2012   SHOULDER SURGERY     TONSILLECTOMY      Family Psychiatric History: Please see initial evaluation for full details. I have reviewed the history. No updates at this time.     Family History:  Family History  Problem Relation Age of Onset   Arthritis Mother    Heart disease Mother    Stroke Mother  Hypertension Mother    Mental illness Mother    Diabetes Mother    Factor V Leiden deficiency Mother    Sleep apnea Mother    Depression Mother    Drug abuse Mother    Mental illness Father    Diabetes Father    CAD Father    Sleep apnea Father    Bleeding Disorder Father        prothrombin gene mutuation   Bipolar disorder Father    Crohn's disease Brother    Kidney failure Paternal Grandfather    Suicidality Maternal Grandfather    Bipolar disorder Paternal Aunt    Clotting disorder Paternal Aunt    Clotting disorder Paternal Grandmother    Breast cancer Neg Hx    Ovarian cancer Neg Hx    Colon cancer Neg Hx      Social History:  Social History   Socioeconomic History   Marital status: Married    Spouse name: Not on file   Number of children: Not on file   Years of education: Not on file   Highest education level: Not on file  Occupational History   Not on file  Tobacco Use   Smoking status: Never   Smokeless tobacco: Never  Vaping Use   Vaping Use: Never used  Substance and Sexual Activity   Alcohol use: Not Currently   Drug use: No   Sexual activity: Yes    Birth control/protection: Pill, None  Other Topics Concern   Not on file  Social History Narrative   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       Social Determinants of Health   Financial Resource Strain: Not on file  Food Insecurity: Not on file  Transportation Needs: Not on file  Physical Activity: Not on file  Stress: Not on file  Social Connections: Not on file    Allergies:  Allergies  Allergen Reactions   Aspirin Anaphylaxis   Levaquin [Levofloxacin In D5w] Hives   Sulfa Antibiotics Other (See Comments)    Metabolic Disorder Labs: Lab Results  Component Value Date   HGBA1C 5.1 06/23/2019   No results found for: PROLACTIN Lab Results  Component Value Date   CHOL 206 (H) 09/19/2020   TRIG 75.0 09/19/2020   HDL 56.50 09/19/2020   CHOLHDL 4 09/19/2020   VLDL 15.0 09/19/2020   LDLCALC 135 (H) 09/19/2020   LDLCALC 153 (H) 01/04/2020   Lab Results  Component Value Date   TSH 0.68 06/23/2019   TSH 1.22 08/20/2018    Therapeutic Level Labs: No results found for: LITHIUM No results found for: VALPROATE No components found for:  CBMZ  Current Medications: Current Outpatient Medications  Medication Sig Dispense Refill   buPROPion (WELLBUTRIN XL) 150 MG 24 hr tablet 450 mg daily. Take along with 300 mg tab 30 tablet 2   buPROPion (WELLBUTRIN XL) 300 MG 24 hr tablet 450 mg daily. Take along with 150 mg tab 30 tablet 2   Eszopiclone 3 MG TABS Take 1 tablet (3 mg total) by mouth  at bedtime. Take immediately before bedtime 30 tablet 1   sertraline (ZOLOFT) 100 MG tablet Take 1.5 tablets (150 mg total) by mouth at bedtime. 45 tablet 1   No current facility-administered medications for this visit.     Musculoskeletal: Strength & Muscle Tone:  N/A Gait & Station:  N/A Patient leans: N/A  Psychiatric Specialty Exam: Review of Systems  Psychiatric/Behavioral:  Positive for decreased concentration, dysphoric  mood and sleep disturbance. Negative for agitation, behavioral problems, confusion, hallucinations, self-injury and suicidal ideas. The patient is nervous/anxious. The patient is not hyperactive.   All other systems reviewed and are negative.  There were no vitals taken for this visit.There is no height or weight on file to calculate BMI.  General Appearance: Fairly Groomed  Eye Contact:  Good  Speech:  Clear and Coherent  Volume:  Normal  Mood:   tired  Affect:  Appropriate, Congruent, and slightly tense  Thought Process:  Coherent  Orientation:  Full (Time, Place, and Person)  Thought Content: Logical   Suicidal Thoughts:  No  Homicidal Thoughts:  No  Memory:  Immediate;   Good  Judgement:  Good  Insight:  Good  Psychomotor Activity:  Normal  Concentration:  Concentration: Good and Attention Span: Good  Recall:  Good  Fund of Knowledge: Good  Language: Good  Akathisia:  No  Handed:  Right  AIMS (if indicated): not done  Assets:  Communication Skills Desire for Improvement  ADL's:  Intact  Cognition: WNL  Sleep:  Fair   Screenings: GAD-7    Flowsheet Row Office Visit from 11/21/2019 in [facility] [place]  Total GAD-7 Score 19      PHQ2-9    Flowsheet Row Video Visit from 07/12/2020 in [facility] Office Visit from 03/21/2020 in [facility] [place] Office Visit from 11/21/2019 in [facility] [place] Office Visit from 08/22/2019 in [facility] [place] Video Visit from  11/29/2018 in [facility] [place]  PHQ-2 Total Score 2 1 2  0 0  PHQ-9 Total Score 6 4 16 8  0      Flowsheet Row Video Visit from 07/12/2020 in [facility]  C-SSRS RISK CATEGORY No Risk        Assessment and Plan:  AMELIYAH SARNO is a 30 y.o. year old female with a history of depression, anxiety, PCOS, who presents for follow up appointment for below.   1. Panic disorder 2. MDD (major depressive disorder), recurrent episode, mild (HCC) She continues to report symptoms of anxiety in the context of taking care of her parents, recent change in work environment due to Melanie Barns and financial strain secondary to her husband being unemployed.  Other psychosocial stressors includes her brother, who recently attempted suicide, and trauma history as a teenager.  We uptitrate sertraline to optimize treatment for anxiety and depression.  She will taper down this medication if she were to experience any side effect of drowsiness.  She is also provided psychoeducation about another treatment option if this medication causes more side effect.  Will continue bupropion to target depression.   3 Insomnia She reports slight worsening in initial and middle insomnia.  The hope is that up titration of sertraline would eventually help for sleep.  We will continue current dose of Lunesta to target insomnia. Referral was made for evaluation of sleep apnea.  She continues to have middle insomnia, daytime fatigue and snoring.  Will make referral again for evaluation of sleep apnea.  Will continue Lunesta to target insomnia.    This clinician has discussed the side effect associated with medication prescribed during this encounter. Please refer to notes in the previous encounters for more details.   Plan Continue  Bupropion 450 mg daily 2. Increase sertraline 150 mg at night - monitor drowsiness 3. Continue Lunesta 3 mg at night as needed for sleep - referred for sleep  study 4. Next  appointment: 9/8 at 4:30  for 30 mins, video 5. Copy link to [facility] for psychodynamic therapy    Past trials of medication: Xanax, bupropion, sertraline   The patient demonstrates the following risk factors for suicide: Chronic risk factors for suicide include: psychiatric disorder of anxiety and completed suicide in a family member. Acute risk factors for suicide include: family or marital conflict and loss (financial, interpersonal, professional). Protective factors for this patient include: positive social support, positive therapeutic relationship, responsibility to others (children, family), coping skills and hope for the future. Considering these factors, the overall suicide risk at this point appears to be low. Patient is appropriate for outpatient follow up.      [name], MD 11/22/2020, 4:02 PM

## 2020-11-22 ENCOUNTER — Telehealth (INDEPENDENT_AMBULATORY_CARE_PROVIDER_SITE_OTHER): Payer: BC Managed Care – PPO | Admitting: Psychiatry

## 2020-11-22 ENCOUNTER — Encounter: Payer: Self-pay | Admitting: Psychiatry

## 2020-11-22 ENCOUNTER — Other Ambulatory Visit: Payer: Self-pay

## 2020-11-22 DIAGNOSIS — F410 Panic disorder [episodic paroxysmal anxiety] without agoraphobia: Secondary | ICD-10-CM | POA: Diagnosis not present

## 2020-11-22 DIAGNOSIS — F330 Major depressive disorder, recurrent, mild: Secondary | ICD-10-CM | POA: Diagnosis not present

## 2020-11-22 DIAGNOSIS — G4700 Insomnia, unspecified: Secondary | ICD-10-CM

## 2020-11-22 MED ORDER — SERTRALINE HCL 100 MG PO TABS: 150.0000 mg | ORAL_TABLET | Freq: Every day | ORAL | 1 refills | Status: DC

## 2020-11-22 MED ORDER — ESZOPICLONE 3 MG PO TABS: 3.0000 mg | ORAL_TABLET | Freq: Every day | ORAL | 1 refills | Status: DC

## 2020-11-22 NOTE — Patient Instructions (Signed)
Continue  Bupropion 450 mg daily 2. Increase sertraline 150 mg at night  3. Continue Lunesta 3 mg at night as needed for sleep - referred for sleep study 4. Next appointment: 9/8 at 4:30

## 2020-12-04 DIAGNOSIS — Z03818 Encounter for observation for suspected exposure to other biological agents ruled out: Secondary | ICD-10-CM | POA: Diagnosis not present

## 2020-12-04 DIAGNOSIS — M791 Myalgia, unspecified site: Secondary | ICD-10-CM | POA: Diagnosis not present

## 2020-12-04 DIAGNOSIS — U071 COVID-19: Secondary | ICD-10-CM | POA: Diagnosis not present

## 2020-12-04 DIAGNOSIS — R509 Fever, unspecified: Secondary | ICD-10-CM | POA: Diagnosis not present

## 2020-12-04 DIAGNOSIS — J029 Acute pharyngitis, unspecified: Secondary | ICD-10-CM | POA: Diagnosis not present

## 2020-12-04 DIAGNOSIS — R519 Headache, unspecified: Secondary | ICD-10-CM | POA: Diagnosis not present

## 2020-12-04 DIAGNOSIS — Z20822 Contact with and (suspected) exposure to covid-19: Secondary | ICD-10-CM | POA: Diagnosis not present

## 2021-01-01 NOTE — Progress Notes (Signed)
Virtual Visit via Video Note  I connected with [name] on 01/03/21 at  4:30 PM EDT by a video enabled telemedicine application and verified that I am speaking with the correct person using two identifiers.  Location: Patient: home Provider: office Persons participated in the visit- patient, provider    I discussed the limitations of evaluation and management by telemedicine and the availability of in person appointments. The patient expressed understanding and agreed to proceed.    I discussed the assessment and treatment plan with the patient. The patient was provided an opportunity to ask questions and all were answered. The patient agreed with the plan and demonstrated an understanding of the instructions.   The patient was advised to call back or seek an in-person evaluation if the symptoms worsen or if the condition fails to improve as anticipated.  I provided 22 minutes of non-face-to-face time during this encounter.   Melanie Hotter, MD    [facility] MD/PA/NP OP Progress Note  01/03/2021 5:09 PM [name]  MRN:  [id number]  Chief Complaint:  Chief Complaint   Depression; Follow-up; Anxiety    HPI:  This is a follow-up appointment for depression and anxiety, insomnia.  She states that her father was admitted again.  It was in the context of him not taking his medication.  She has not visited him due to her having COVID in August.  She feels frustrated about the situation.  She also talks about an episode of her having out of body experience when she is not the trip for a bachelor's party was canceled due to her father's medical condition.  She was able to go there, and she had a good time there.  She has been busy at work, and taking care of her mother.  Her husband has been doing better.  He may be able to have a job.  If it helps for financial strain.  She has worsening in and insomnia since getting COVID.  This also coincided with her taking higher dose of sertraline.  It  does not cause drowsiness anymore, and she agrees to try taking the medication in the morning.  She feels fatigue.  She has difficulty in concentration, although it has been getting better.  She has increase in appetite.  She denies SI.  She feels anxious at times.  She almost had a panic attack when she had out of body experience.  She was able to see a therapist, and feels comfortable to continue to see the one.  She denies alcohol use or drug use.   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 MDD (major depressive disorder), recurrent episode, mild (HCC)  F33.0     2. Panic disorder  F41.0 sertraline (ZOLOFT) 100 MG tablet    3. Insomnia, unspecified type  G47.00 Eszopiclone 3 MG TABS      Past Psychiatric History: Please see initial evaluation for full details. I have reviewed the history. No updates at this time.     Past Medical History:  Past Medical History:  Diagnosis Date   Asthma    PCOS (polycystic ovarian syndrome)    UTI (urinary tract infection)     Past Surgical History:  Procedure Laterality Date   BREAST ENHANCEMENT SURGERY  2012   SHOULDER SURGERY     TONSILLECTOMY      Family Psychiatric History: Please see initial evaluation for full details. I have reviewed the history. No updates at this time.     Family History:  Family History  Problem Relation Age of Onset   Arthritis Mother    Heart disease Mother    Stroke Mother    Hypertension Mother    Mental illness Mother    Diabetes Mother    Factor V Leiden deficiency Mother    Sleep apnea Mother     Depression Mother    Drug abuse Mother    Mental illness Father    Diabetes Father    CAD Father    Sleep apnea Father    Bleeding Disorder Father        prothrombin gene mutuation   Bipolar disorder Father    Crohn's disease Brother    Kidney failure Paternal Grandfather    Suicidality Maternal Grandfather    Bipolar disorder Paternal Aunt    Clotting disorder Paternal Aunt    Clotting disorder Paternal Grandmother    Breast cancer Neg Hx    Ovarian cancer Neg Hx    Colon cancer Neg Hx     Social History:  Social History   Socioeconomic History   Marital status: Married    Spouse name: Not on file   Number of children: Not on file   Years of education: Not on file   Highest education level: Not on file  Occupational History   Not on file  Tobacco Use   Smoking status: Never   Smokeless tobacco: Never  Vaping Use   Vaping Use: Never used  Substance and Sexual Activity   Alcohol use: Not Currently   Drug use: No   Sexual activity: Yes    Birth control/protection: Pill, None  Other Topics Concern   Not on file  Social History Narrative   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       Social Determinants of Health   Financial Resource Strain: Not on file  Food Insecurity: Not on file  Transportation Needs: Not on file  Physical Activity: Not on file  Stress: Not on file  Social Connections: Not on file    Allergies:  Allergies  Allergen Reactions   Aspirin Anaphylaxis   Levaquin [Levofloxacin In D5w] Hives   Sulfa Antibiotics Other (See Comments)    Metabolic Disorder Labs: Lab Results  Component Value Date   HGBA1C 5.1 06/23/2019   No results found for: PROLACTIN Lab Results  Component Value Date   CHOL 206 (H) 09/19/2020   TRIG 75.0 09/19/2020   HDL 56.50 09/19/2020   CHOLHDL 4 09/19/2020   VLDL 15.0 09/19/2020   LDLCALC 135 (H) 09/19/2020   LDLCALC 153 (H) 01/04/2020   Lab Results  Component Value Date   TSH 0.68  06/23/2019   TSH 1.22 08/20/2018    Therapeutic Level Labs: No results found for: LITHIUM No results found for: VALPROATE No components found for:  CBMZ  Current Medications: Current Outpatient Medications  Medication Sig Dispense Refill   [START ON 01/16/2021] buPROPion (WELLBUTRIN XL) 150 MG 24 hr tablet 450 mg daily. Take along with 300 mg tab  30 tablet 5   [START ON 01/16/2021] buPROPion (WELLBUTRIN XL) 300 MG 24 hr tablet 450 mg daily. Take along with 150 mg tab 30 tablet 5   [START ON 01/25/2021] Eszopiclone 3 MG TABS Take 1 tablet (3 mg total) by mouth at bedtime. Take immediately before bedtime 30 tablet 0   [START ON 01/22/2021] sertraline (ZOLOFT) 100 MG tablet Take 1.5 tablets (150 mg total) by mouth at bedtime. 45 tablet 0   No current facility-administered medications for this visit.     Musculoskeletal: Strength & Muscle Tone:  N/A Gait & Station:  N/A Patient leans: N/A  Psychiatric Specialty Exam: Review of Systems  Psychiatric/Behavioral:  Positive for decreased concentration, dysphoric mood and sleep disturbance. Negative for agitation, behavioral problems, confusion, hallucinations, self-injury and suicidal ideas. The patient is nervous/anxious. The patient is not hyperactive.   All other systems reviewed and are negative.  There were no vitals taken for this visit.There is no height or weight on file to calculate BMI.  General Appearance: Fairly Groomed  Eye Contact:  Good  Speech:  Clear and Coherent  Volume:  Normal  Mood:   frustrated  Affect:  Appropriate, Congruent, and tense at times, but calm  Thought Process:  Coherent  Orientation:  Full (Time, Place, and Person)  Thought Content: Logical   Suicidal Thoughts:  No  Homicidal Thoughts:  No  Memory:  Immediate;   Good  Judgement:  Good  Insight:  Good  Psychomotor Activity:  Normal  Concentration:  Concentration: Good and Attention Span: Good  Recall:  Good  Fund of Knowledge: Good  Language:  Good  Akathisia:  No  Handed:  Right  AIMS (if indicated): not done  Assets:  Communication Skills Desire for Improvement  ADL's:  Intact  Cognition: WNL  Sleep:  Poor   Screenings: GAD-7    Flowsheet Row Office Visit from 11/21/2019 in [facility] [place]  Total GAD-7 Score 19      PHQ2-9    Flowsheet Row Video Visit from 07/12/2020 in [facility] Office Visit from 03/21/2020 in [facility] [place] Office Visit from 11/21/2019 in [facility] [place] Office Visit from 08/22/2019 in [facility] [place] Video Visit from 11/29/2018 in [facility]   PHQ-2 Total Score 2 1 2  0 0  PHQ-9 Total Score 6 4 16 8  0      Flowsheet Row Video Visit from 07/12/2020 in [facility]  C-SSRS RISK CATEGORY No Risk        Assessment and Plan:  TIONA RUANE is a 30 y.o. year old female with a history of depression, anxiety, PCOS, who presents for follow up appointment for below.   1. Panic disorder 3. MDD (major depressive disorder), recurrent episode, mild (HCC) Although she continues to report occasional depressive symptoms and an anxiety, she has been handling things relatively well since up titration of sertraline.  Psychosocial stressors includes taking care of her mother while her father being admitted to [facility], financial strain, trauma history as a teenager.  Will continue current dose of sertraline to target depression and anxiety.  She is advised to try taking in the morning to see if it helps for insomnia.  Will continue bupropion adjunctive treatment for depression.  She will greatly benefit from CBT; she will continue to see a therapist.   2. Insomnia, unspecified type She reports worsening in and insomnia, which coincided with starting higher dose of sertraline/suffering from COVID.  Will continue Lunesta at this time to target insomnia.  Noted that she has  occasional snoring, daytime fatigue and middle insomnia.  Will make referral for evaluation of sleep apnea.   This clinician has discussed the side effect associated with medication prescribed during this encounter. Please refer to notes in the previous encounters for more details.    Plan Continue  Bupropion 450 mg daily 2. Continue sertraline 150 mg in the morning- monitor insomnia 3. Continue Lunesta 3 mg at night as needed for sleep 4. Referral for sleep evaluation 4. Next appointment: 10/25 at 4:30  for 30 mins, video    Past trials of medication: bupropion, sertraline, Xanax,    The patient demonstrates the following risk factors for suicide: Chronic risk factors for suicide include: psychiatric disorder of anxiety and completed suicide in a family member. Acute risk factors for suicide include: family or marital conflict and loss (financial, interpersonal, professional). Protective factors for this patient include: positive social support, positive therapeutic relationship, responsibility to others (children, family), coping skills and hope for the future. Considering these factors, the overall suicide risk at this point appears to be low. Patient is appropriate for outpatient follow up.          Melanie Hotter, MD 01/03/2021, 5:09 PM

## 2021-01-03 ENCOUNTER — Other Ambulatory Visit: Payer: Self-pay

## 2021-01-03 ENCOUNTER — Encounter: Payer: Self-pay | Admitting: Psychiatry

## 2021-01-03 ENCOUNTER — Telehealth (INDEPENDENT_AMBULATORY_CARE_PROVIDER_SITE_OTHER): Payer: BC Managed Care – PPO | Admitting: Psychiatry

## 2021-01-03 DIAGNOSIS — F410 Panic disorder [episodic paroxysmal anxiety] without agoraphobia: Secondary | ICD-10-CM | POA: Diagnosis not present

## 2021-01-03 DIAGNOSIS — G4700 Insomnia, unspecified: Secondary | ICD-10-CM

## 2021-01-03 DIAGNOSIS — F411 Generalized anxiety disorder: Secondary | ICD-10-CM | POA: Diagnosis not present

## 2021-01-03 DIAGNOSIS — F330 Major depressive disorder, recurrent, mild: Secondary | ICD-10-CM

## 2021-01-03 DIAGNOSIS — F909 Attention-deficit hyperactivity disorder, unspecified type: Secondary | ICD-10-CM | POA: Diagnosis not present

## 2021-01-03 MED ORDER — ESZOPICLONE 3 MG PO TABS: 3.0000 mg | ORAL_TABLET | Freq: Every day | ORAL | 0 refills | Status: DC

## 2021-01-03 MED ORDER — SERTRALINE HCL 100 MG PO TABS: 150.0000 mg | ORAL_TABLET | Freq: Every day | ORAL | 0 refills | Status: DC

## 2021-01-03 MED ORDER — BUPROPION HCL ER (XL) 150 MG PO TB24: ORAL_TABLET | ORAL | 5 refills | Status: DC

## 2021-01-03 MED ORDER — BUPROPION HCL ER (XL) 300 MG PO TB24: ORAL_TABLET | ORAL | 5 refills | Status: DC

## 2021-01-03 NOTE — Patient Instructions (Signed)
Continue  Bupropion 450 mg daily 2. Continue sertraline 150 mg in the morning 3. Continue Lunesta 3 mg at night as needed for sleep 4. Referral for sleep evaluation 4. Next appointment: 10/25 at 4:30

## 2021-01-03 NOTE — Addendum Note (Signed)
Addended by: [name] on: 01/03/2021 05:41 PM   Modules accepted: Orders

## 2021-01-10 DIAGNOSIS — F909 Attention-deficit hyperactivity disorder, unspecified type: Secondary | ICD-10-CM | POA: Diagnosis not present

## 2021-01-10 DIAGNOSIS — F411 Generalized anxiety disorder: Secondary | ICD-10-CM | POA: Diagnosis not present

## 2021-01-28 DIAGNOSIS — F909 Attention-deficit hyperactivity disorder, unspecified type: Secondary | ICD-10-CM | POA: Diagnosis not present

## 2021-01-28 DIAGNOSIS — F411 Generalized anxiety disorder: Secondary | ICD-10-CM | POA: Diagnosis not present

## 2021-02-15 NOTE — Progress Notes (Deleted)
[facility] MD/PA/NP OP Progress Note  02/15/2021 10:03 AM [name]  MRN:  [id number]  Chief Complaint:  HPI: *** Visit Diagnosis: No diagnosis found.  Past Psychiatric History: Please see initial evaluation for full details. I have reviewed the history. No updates at this time.     Past Medical History:  Past Medical History:  Diagnosis Date   Asthma    PCOS (polycystic ovarian syndrome)    UTI (urinary tract infection)     Past Surgical History:  Procedure Laterality Date   BREAST ENHANCEMENT SURGERY  2012   SHOULDER SURGERY     TONSILLECTOMY      Family Psychiatric History: Please see initial evaluation for full details. I have reviewed the history. No updates at this time.     Family History:  Family History  Problem Relation Age of Onset   Arthritis Mother    Heart disease Mother    Stroke Mother    Hypertension Mother    Mental illness Mother    Diabetes Mother    Factor V Leiden deficiency Mother    Sleep apnea Mother    Depression Mother    Drug abuse Mother    Mental illness Father    Diabetes Father    CAD Father    Sleep apnea Father    Bleeding Disorder Father        prothrombin gene mutuation   Bipolar disorder Father    Crohn's disease Brother    Kidney failure Paternal Grandfather    Suicidality Maternal Grandfather    Bipolar disorder Paternal Aunt    Clotting disorder Paternal Aunt    Clotting disorder Paternal Grandmother    Breast cancer Neg Hx    Ovarian cancer Neg Hx    Colon cancer Neg Hx     Social History:  Social History   Socioeconomic History   Marital status: Married    Spouse name: Not on file   Number of children: Not on file   Years of education: Not on file   Highest education level: Not on file  Occupational History   Not on file  Tobacco Use   Smoking status: Never   Smokeless tobacco: Never  Vaping Use   Vaping Use: Never used  Substance and Sexual Activity   Alcohol use: Not Currently   Drug use: No    Sexual activity: Yes    Birth control/protection: Pill, None  Other Topics Concern   Not on file  Social History Narrative   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       Social Determinants of Health   Financial Resource Strain: Not on file  Food Insecurity: Not on file  Transportation Needs: Not on file  Physical Activity: Not on file  Stress: Not on file  Social Connections: Not on file    Allergies:  Allergies  Allergen Reactions   Aspirin Anaphylaxis   Levaquin [Levofloxacin In D5w] Hives   Sulfa Antibiotics Other (See Comments)    Metabolic Disorder Labs: Lab Results  Component Value Date   HGBA1C 5.1 06/23/2019   No results found for: PROLACTIN Lab Results  Component Value Date   CHOL 206 (H) 09/19/2020   TRIG 75.0 09/19/2020   HDL 56.50 09/19/2020   CHOLHDL 4 09/19/2020   VLDL 15.0 09/19/2020   LDLCALC 135 (H) 09/19/2020   LDLCALC 153 (H) 01/04/2020   Lab Results  Component Value Date   TSH 0.68 06/23/2019   TSH 1.22 08/20/2018  Therapeutic Level Labs: No results found for: LITHIUM No results found for: VALPROATE No components found for:  CBMZ  Current Medications: Current Outpatient Medications  Medication Sig Dispense Refill   buPROPion (WELLBUTRIN XL) 150 MG 24 hr tablet 450 mg daily. Take along with 300 mg tab 30 tablet 5   buPROPion (WELLBUTRIN XL) 300 MG 24 hr tablet 450 mg daily. Take along with 150 mg tab 30 tablet 5   Eszopiclone 3 MG TABS Take 1 tablet (3 mg total) by mouth at bedtime. Take immediately before bedtime 30 tablet 0   sertraline (ZOLOFT) 100 MG tablet Take 1.5 tablets (150 mg total) by mouth at bedtime. 45 tablet 0   No current facility-administered medications for this visit.     Musculoskeletal: Strength & Muscle Tone:  N/A Gait & Station:  N/A Patient leans: N/A  Psychiatric Specialty Exam: Review of Systems  There were no vitals taken for this visit.There is no height or weight on file to  calculate BMI.  General Appearance: {Appearance:22683}  Eye Contact:  {BHH EYE CONTACT:22684}  Speech:  Clear and Coherent  Volume:  Normal  Mood:  {BHH MOOD:22306}  Affect:  {Affect (PAA):22687}  Thought Process:  Coherent  Orientation:  Full (Time, Place, and Person)  Thought Content: Logical   Suicidal Thoughts:  {ST/HT (PAA):22692}  Homicidal Thoughts:  {ST/HT (PAA):22692}  Memory:  Immediate;   Good  Judgement:  {Judgement (PAA):22694}  Insight:  {Insight (PAA):22695}  Psychomotor Activity:  Normal  Concentration:  Concentration: Good and Attention Span: Good  Recall:  Good  Fund of Knowledge: Good  Language: Good  Akathisia:  No  Handed:  Right  AIMS (if indicated): not done  Assets:  Communication Skills Desire for Improvement  ADL's:  Intact  Cognition: WNL  Sleep:  {BHH GOOD/FAIR/POOR:22877}   Screenings: GAD-7    Flowsheet Row Office Visit from 11/21/2019 in [facility] [place]  Total GAD-7 Score 19      PHQ2-9    Flowsheet Row Video Visit from 07/12/2020 in [facility] Office Visit from 03/21/2020 in [facility] [place] Office Visit from 11/21/2019 in [facility] [place] Office Visit from 08/22/2019 in [facility] [place] Video Visit from 11/29/2018 in [facility] [place]  PHQ-2 Total Score 2 1 2  0 0  PHQ-9 Total Score 6 4 16 8  0      Flowsheet Row Video Visit [facility] 07/12/2020 in [facility]  C-SSRS RISK CATEGORY No Risk        Assessment and Plan:  [name] is a 30 y.o. year old female with a history of depression, anxiety, PCOS, who presents for follow up appointment for below.   1. Panic disorder 3. MDD (major depressive disorder), recurrent episode, mild (HCC) Although she continues to report occasional depressive symptoms and an anxiety, she has been handling things relatively well since up titration of sertraline.  Psychosocial  stressors includes taking care of her mother while her father being admitted to the hospital, financial strain, trauma history as a teenager.  Will continue current dose of sertraline to target depression and anxiety.  She is advised to try taking in the morning to see if it helps for insomnia.  Will continue bupropion adjunctive treatment for depression.  She will greatly benefit from CBT; she will continue to see a therapist.    2. Insomnia, unspecified type She reports worsening in and insomnia, which coincided with starting higher dose of sertraline/suffering from  COVID.  Will continue Lunesta at this time to target insomnia.  Noted that she has occasional snoring, daytime fatigue and middle insomnia.  Will make referral for evaluation of sleep apnea.    This clinician has discussed the side effect associated with medication prescribed during this encounter. Please refer to notes in the previous encounters for more details.     Plan Continue  Bupropion 450 mg daily 2. Continue sertraline 150 mg in the morning- monitor insomnia 3. Continue Lunesta 3 mg at night as needed for sleep 4. Referral for sleep evaluation 4. Next appointment: 10/25 at 4:30  for 30 mins, video     Past trials of medication: bupropion, sertraline, Xanax,    The patient demonstrates the following risk factors for suicide: Chronic risk factors for suicide include: psychiatric disorder of anxiety and completed suicide in a family member. Acute risk factors for suicide include: family or marital conflict and loss (financial, interpersonal, professional). Protective factors for this patient include: positive social support, positive therapeutic relationship, responsibility to others (children, family), coping skills and hope for the future. Considering these factors, the overall suicide risk at this point appears to be low. Patient is appropriate for outpatient follow up.    [name], MD 02/15/2021, 10:03 AM

## 2021-02-18 DIAGNOSIS — F909 Attention-deficit hyperactivity disorder, unspecified type: Secondary | ICD-10-CM | POA: Diagnosis not present

## 2021-02-18 DIAGNOSIS — F411 Generalized anxiety disorder: Secondary | ICD-10-CM | POA: Diagnosis not present

## 2021-02-19 ENCOUNTER — Other Ambulatory Visit: Payer: Self-pay

## 2021-02-19 ENCOUNTER — Telehealth: Payer: BC Managed Care – PPO | Admitting: Psychiatry

## 2021-02-27 ENCOUNTER — Encounter: Payer: Self-pay | Admitting: Primary Care

## 2021-02-27 ENCOUNTER — Ambulatory Visit (INDEPENDENT_AMBULATORY_CARE_PROVIDER_SITE_OTHER): Payer: BC Managed Care – PPO | Admitting: Primary Care

## 2021-02-27 ENCOUNTER — Other Ambulatory Visit: Payer: Self-pay

## 2021-02-27 VITALS — BP 100/80 | HR 96 | Temp 97.5°F | Ht 67.0 in | Wt 143.6 lb

## 2021-02-27 DIAGNOSIS — G4700 Insomnia, unspecified: Secondary | ICD-10-CM

## 2021-02-27 DIAGNOSIS — Z9189 Other specified personal risk factors, not elsewhere classified: Secondary | ICD-10-CM

## 2021-02-27 DIAGNOSIS — F514 Sleep terrors [night terrors]: Secondary | ICD-10-CM

## 2021-02-27 NOTE — Progress Notes (Signed)
@Patient  ID: , female    DOB: Oct 20, 1990, 30 y.o.   MRN: [id number]  No chief complaint on file.   Referring provider: [id number], FNP  HPI: 30 year old female, never smoked. PMH significant for anxiety/depression, insomnia, PCOS, elevated cholesterol.   02/27/2021 Patient presents today for sleep consult, she was referred by her psychiatrist d.t insomnia. She has had insomnia since she was 30 year old. She has symptoms of restless sleep and daytime sleepiness. She experiences vivid nightmares once a month which cause her to wake up screaming along with occasional sleep walking. She has been tried on Ambien and is currently on lunesta which helps her fall asleep but she is not able to maintain sleep. She wakes up several time a night. She does not report any snoring or witness apnea. Both her parents and brother have sleep apnea and wear CPAP. She follows closely with psychiatry and therapist. There is a history of anxiety/depression and prior trauma. She is on Wellbutrin and Zoloft. Denies cataplexy or narcolepsy.    Sleep questionnaire Prior sleep study- None Symptoms- Restless sleep, daytime sleepiness, insomnia, night terrors, sleep walking Bedtime-10-11pm Take it takes to fall asleep- 2 hours Nocturnal awakenings- 3-4  Time out of bed in morning- 7am Weight changes- down 60 lbs  Epworth- 7/24    Allergies  Allergen Reactions   Aspirin Anaphylaxis   Levaquin [Levofloxacin In D5w] Hives   Sulfa Antibiotics Other (See Comments)    Immunization History  Administered Date(s) Administered   Moderna Sars-Covid-2 Vaccination 12/10/2019, 01/07/2020    Past Medical History:  Diagnosis Date   Asthma    PCOS (polycystic ovarian syndrome)    UTI (urinary tract infection)     Tobacco History: Social History   Tobacco Use  Smoking Status Never  Smokeless Tobacco Never   Counseling given: Not Answered   Outpatient Medications Prior to Visit   Medication Sig Dispense Refill   buPROPion (WELLBUTRIN XL) 150 MG 24 hr tablet 450 mg daily. Take along with 300 mg tab 30 tablet 5   buPROPion (WELLBUTRIN XL) 300 MG 24 hr tablet 450 mg daily. Take along with 150 mg tab 30 tablet 5   Eszopiclone 3 MG TABS Take 1 tablet (3 mg total) by mouth at bedtime. Take immediately before bedtime 30 tablet 0   sertraline (ZOLOFT) 100 MG tablet Take 1.5 tablets (150 mg total) by mouth at bedtime. 45 tablet 0   No facility-administered medications prior to visit.   Review of Systems  Review of Systems  Constitutional:  Positive for fatigue.  HENT: Negative.    Respiratory: Negative.    Cardiovascular: Negative.   Psychiatric/Behavioral:  Positive for sleep disturbance.     Physical Exam  BP 100/80 (BP Location: Left Arm, Patient Position: Sitting, Cuff Size: Normal)   Pulse 96   Temp (!) 97.5 F (36.4 C) (Oral)   Ht 5\' 7"  (1.702 m)   Wt 143 lb 9.6 oz (65.1 kg)   SpO2 98%   BMI 22.49 kg/m  Physical Exam Constitutional:      Appearance: Normal appearance.  HENT:     Head: Normocephalic and atraumatic.     Mouth/Throat:     Mouth: Mucous membranes are moist.     Pharynx: Oropharynx is clear.     Comments: Mallampati class II-III/ hx tonsillectomy  Cardiovascular:     Rate and Rhythm: Normal rate and regular rhythm.  Pulmonary:     Effort: Pulmonary effort is normal.  Breath sounds: Normal breath sounds.  Musculoskeletal:        General: Normal range of motion.  Skin:    General: Skin is warm and dry.  Neurological:     General: No focal deficit present.     Mental Status: She is alert and oriented to person, place, and time. Mental status is at baseline.  Psychiatric:        Mood and Affect: Mood normal.        Behavior: Behavior normal.        Thought Content: Thought content normal.        Judgment: Judgment normal.     Lab Results:  CBC    Component Value Date/Time   WBC 7.2 06/23/2019 1523   RBC 4.24 06/23/2019  1523   HGB 14.2 06/23/2019 1523   HCT 41.5 06/23/2019 1523   PLT 346.0 06/23/2019 1523   MCV 97.7 06/23/2019 1523   MCH 33.0 08/20/2018 1528   MCHC 34.4 06/23/2019 1523   RDW 13.1 06/23/2019 1523   LYMPHSABS 3.0 06/23/2019 1523   MONOABS 0.4 06/23/2019 1523   EOSABS 0.1 06/23/2019 1523   BASOSABS 0.1 06/23/2019 1523    BMET    Component Value Date/Time   NA 138 06/23/2019 1523   K 3.6 06/23/2019 1523   CL 103 06/23/2019 1523   CO2 26 06/23/2019 1523   GLUCOSE 82 06/23/2019 1523   BUN 7 06/23/2019 1523   CREATININE 0.74 06/23/2019 1523   CREATININE 0.72 08/20/2018 1528   CALCIUM 9.2 06/23/2019 1523   GFRNONAA >60 04/04/2018 1445   GFRAA >60 04/04/2018 1445    BNP No results found for: BNP  ProBNP No results found for: PROBNP  Imaging: No results found.   Assessment & Plan:   Insomnia - Patient has long standing history of insomnia. She has underlying anxiety/depression whom she is following with psychiatry. She has occasional night terrors and sleep walks at times. She is taking Wellbutrin, Zoloft and Lunesta. Prescription sleep aid helps her initiate sleep but she has difficulty maintaining. She has a strong family history of sleep apnea. Epworth 7/24. BMI is normal. Recommend getting NPSG to assess for underlying sleep apnea. Recommend she stay on Lunesta for now and if sleep study is negative we can try her on a different sleep aid such as Doxepin. She is on board with this plan. We discuss risk of untreated sleep apnea including cardiac arrhthymias, stroke, pulmonary HTN and DM. We also reviewed treatment options including oral appliance, CPAP or referral to ENT for possible surgical options. Follow-up in 4-6 weeks with Dr. Craige Cotta (new patient) preferably or Beth NP if no availability.   60 mins spent on case; >50% face to face with patient   Glenford Bayley, NP 02/27/2021

## 2021-02-27 NOTE — Assessment & Plan Note (Addendum)
-   Patient has long standing history of insomnia. She has underlying anxiety/depression whom she is following with psychiatry. She has occasional night terrors and sleep walks at times. She is taking Wellbutrin, Zoloft and Lunesta. Prescription sleep aid helps her initiate sleep but she has difficulty maintaining. She has a strong family history of sleep apnea. Epworth 7/24. BMI is normal. Recommend getting NPSG to assess for underlying sleep apnea. Recommend she stay on Lunesta for now and if sleep study is negative we can try her on a different sleep aid such as Doxepin. She is on board with this plan. We discuss risk of untreated sleep apnea including cardiac arrhthymias, stroke, pulmonary HTN and DM. We also reviewed treatment options including oral appliance, CPAP or referral to ENT for possible surgical options. Follow-up in 4-6 weeks with Dr. Craige Cotta (new patient) preferably or Beth NP if no availability.

## 2021-02-27 NOTE — Patient Instructions (Signed)
Recommendations: We will get sleep study to rule out sleep apnea For now stay on Lunesta, may change to a different agent if sleep study is negative for OSA   Follow-up: 5-6 weeks with Dr. Craige Cotta new patient (preferably); if nothing available ok to see me    Insomnia Insomnia is a sleep disorder that makes it difficult to fall asleep or stay asleep. Insomnia can cause fatigue, low energy, difficulty concentrating, mood swings, and poor performance at work or school. There are three different ways to classify insomnia: Difficulty falling asleep. Difficulty staying asleep. Waking up too early in the morning. Any type of insomnia can be long-term (chronic) or short-term (acute). Both are common. Short-term insomnia usually lasts for three months or less. Chronic insomnia occurs at least three times a week for longer than three months. What are the causes? Insomnia may be caused by another condition, situation, or substance, such as: Anxiety. Certain medicines. Gastroesophageal reflux disease (GERD) or other gastrointestinal conditions. Asthma or other breathing conditions. Restless legs syndrome, sleep apnea, or other sleep disorders. Chronic pain. Menopause. Stroke. Abuse of alcohol, tobacco, or illegal drugs. Mental health conditions, such as depression. Caffeine. Neurological disorders, such as Alzheimer's disease. An overactive thyroid (hyperthyroidism). Sometimes, the cause of insomnia may not be known. What increases the risk? Risk factors for insomnia include: Gender. Women are affected more often than men. Age. Insomnia is more common as you get older. Stress. Lack of exercise. Irregular work schedule or working night shifts. Traveling between different time zones. Certain medical and mental health conditions. What are the signs or symptoms? If you have insomnia, the main symptom is having trouble falling asleep or having trouble staying asleep. This may lead to other  symptoms, such as: Feeling fatigued or having low energy. Feeling nervous about going to sleep. Not feeling rested in the morning. Having trouble concentrating. Feeling irritable, anxious, or depressed. How is this diagnosed? This condition may be diagnosed based on: Your symptoms and medical history. Your health care provider may ask about: Your sleep habits. Any medical conditions you have. Your mental health. A physical exam. How is this treated? Treatment for insomnia depends on the cause. Treatment may focus on treating an underlying condition that is causing insomnia. Treatment may also include: Medicines to help you sleep. Counseling or therapy. Lifestyle adjustments to help you sleep better. Follow these instructions at home: Eating and drinking  Limit or avoid alcohol, caffeinated beverages, and cigarettes, especially close to bedtime. These can disrupt your sleep. Do not eat a large meal or eat spicy foods right before bedtime. This can lead to digestive discomfort that can make it hard for you to sleep. Sleep habits  Keep a sleep diary to help you and your health care provider figure out what could be causing your insomnia. Write down: When you sleep. When you wake up during the night. How well you sleep. How rested you feel the next day. Any side effects of medicines you are taking. What you eat and drink. Make your bedroom a dark, comfortable place where it is easy to fall asleep. Put up shades or blackout curtains to block light from outside. Use a white noise machine to block noise. Keep the temperature cool. Limit screen use before bedtime. This includes: Watching TV. Using your smartphone, tablet, or computer. Stick to a routine that includes going to bed and waking up at the same times every day and night. This can help you fall asleep faster. Consider making a quiet  activity, such as reading, part of your nighttime routine. Try to avoid taking naps during  the day so that you sleep better at night. Get out of bed if you are still awake after 15 minutes of trying to sleep. Keep the lights down, but try reading or doing a quiet activity. When you feel sleepy, go back to bed. General instructions Take over-the-counter and prescription medicines only as told by your health care provider. Exercise regularly, as told by your health care provider. Avoid exercise starting several hours before bedtime. Use relaxation techniques to manage stress. Ask your health care provider to suggest some techniques that may work well for you. These may include: Breathing exercises. Routines to release muscle tension. Visualizing peaceful scenes. Make sure that you drive carefully. Avoid driving if you feel very sleepy. Keep all follow-up visits as told by your health care provider. This is important. Contact a health care provider if: You are tired throughout the day. You have trouble in your daily routine due to sleepiness. You continue to have sleep problems, or your sleep problems get worse. Get help right away if: You have serious thoughts about hurting yourself or someone else. If you ever feel like you may hurt yourself or others, or have thoughts about taking your own life, get help right away. You can go to your nearest emergency department or call: Your local emergency services (911 in the [place] A suicide crisis helpline, such as the National Suicide Prevention Lifeline at 919-409-1593. This is open 24 hours a day. Summary Insomnia is a sleep disorder that makes it difficult to fall asleep or stay asleep. Insomnia can be long-term (chronic) or short-term (acute). Treatment for insomnia depends on the cause. Treatment may focus on treating an underlying condition that is causing insomnia. Keep a sleep diary to help you and your health care provider figure out what could be causing your insomnia. This information is not intended to replace advice given to  you by your health care provider. Make sure you discuss any questions you have with your health care provider. Document Revised: 02/23/2020 Document Reviewed: 02/23/2020 Elsevier Patient Education  2022 [employer].

## 2021-02-28 ENCOUNTER — Other Ambulatory Visit: Payer: Self-pay | Admitting: Psychiatry

## 2021-02-28 ENCOUNTER — Telehealth: Payer: Self-pay

## 2021-02-28 DIAGNOSIS — F410 Panic disorder [episodic paroxysmal anxiety] without agoraphobia: Secondary | ICD-10-CM

## 2021-02-28 DIAGNOSIS — G4700 Insomnia, unspecified: Secondary | ICD-10-CM

## 2021-02-28 MED ORDER — ESZOPICLONE 3 MG PO TABS
3.0000 mg | ORAL_TABLET | Freq: Every day | ORAL | 0 refills | Status: DC
Start: 2021-02-28 — End: 2021-03-12

## 2021-02-28 MED ORDER — SERTRALINE HCL 100 MG PO TABS: 150.0000 mg | ORAL_TABLET | Freq: Every day | ORAL | 0 refills | Status: DC

## 2021-02-28 NOTE — Telephone Encounter (Signed)
received fax requesting a refill on the eszopiclone and the zoloft

## 2021-02-28 NOTE — Telephone Encounter (Signed)
Ordered

## 2021-02-28 NOTE — Progress Notes (Signed)
Reviewed and agree with assessment/plan.   Caroline Matters, MD [facility] 02/28/2021, 8:56 AM Pager:  [phone]  

## 2021-03-07 NOTE — Progress Notes (Signed)
Virtual Visit via Video Note  I connected with [name] on 03/12/21 at  4:00 PM EST by a video enabled telemedicine application and verified that I am speaking with the correct person using two identifiers.  Location: Patient: home Provider: office Persons participated in the visit- patient, provider    I discussed the limitations of evaluation and management by telemedicine and the availability of in person appointments. The patient expressed understanding and agreed to proceed.    I discussed the assessment and treatment plan with the patient. The patient was provided an opportunity to ask questions and all were answered. The patient agreed with the plan and demonstrated an understanding of the instructions.   The patient was advised to call back or seek an in-person evaluation if the symptoms worsen or if the condition fails to improve as anticipated.  I provided 25 minutes of non-face-to-face time during this encounter.   [name], MD    [facility] MD/PA/NP OP Progress Note  03/12/2021 5:18 PM [name]  MRN:  [id number]  Chief Complaint:  Chief Complaint   Depression; Follow-up; Anxiety    HPI:  This is a follow-up appointment for depression and anxiety.  She states that she has been doing well.  Her father was admitted to the hospital.  Her mother started to go to PT after she had conversation with her mother. She has been encouraging her mother, and trying to be optimistic, although she also feels it may be fragile.  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 She talks about an occasion that she may have come across as aggressive to her neighbor the other day.  She sees a therapist, and has been through things. Her husband started a job, and will receive a paycheck, which is a huge help for the household.  She has middle insomnia and fatigue.  She agrees to contact the sleep clinic for study.  She feels depressed at times.  Although she feels anxious at times, it has been more  manageable.  There is a fluctuation in her appetite.  She has good concentration.  She denies SI.    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 MDD (major depressive disorder), recurrent episode, mild (HCC)  F33.0     2. Panic disorder  F41.0 sertraline (ZOLOFT) 100 MG tablet    3. Insomnia, unspecified type  G47.00 Eszopiclone 3 MG TABS      Past Psychiatric History: Please see initial evaluation for full details. I have reviewed the history. No updates at this time.     Past Medical History:  Past Medical History:  Diagnosis Date   Asthma    PCOS (polycystic ovarian syndrome)    UTI (urinary tract infection)     Past Surgical History:  Procedure Laterality Date   BREAST ENHANCEMENT SURGERY  2012   SHOULDER SURGERY     TONSILLECTOMY      Family Psychiatric History: Please see initial evaluation for full details. I have reviewed the history. No updates at this time.  Family History:  Family History  Problem Relation Age of Onset   Arthritis Mother    Heart disease Mother    Stroke Mother    Hypertension Mother    Mental illness Mother    Diabetes Mother    Factor V Leiden deficiency Mother    Sleep apnea Mother    Depression Mother    Drug abuse Mother    Mental illness Father    Diabetes Father    CAD Father    Sleep apnea Father    Bleeding Disorder Father        prothrombin gene mutuation   Bipolar disorder Father    Crohn's disease Brother    Kidney failure Paternal Grandfather    Suicidality Maternal Grandfather    Bipolar disorder Paternal Aunt     Clotting disorder Paternal Aunt    Clotting disorder Paternal Grandmother    Breast cancer Neg Hx    Ovarian cancer Neg Hx    Colon cancer Neg Hx     Social History:  Social History   Socioeconomic History   Marital status: Married    Spouse name: Not on file   Number of children: Not on file   Years of education: Not on file   Highest education level: Not on file  Occupational History   Not on file  Tobacco Use   Smoking status: Never   Smokeless tobacco: Never  Vaping Use   Vaping Use: Never used  Substance and Sexual Activity   Alcohol use: Not Currently   Drug use: No   Sexual activity: Yes    Birth control/protection: Pill, None  Other Topics Concern   Not on file  Social History Narrative   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       Social Determinants of Health   Financial Resource Strain: Not on file  Food Insecurity: Not on file  Transportation Needs: Not on file  Physical Activity: Not on file  Stress: Not on file  Social Connections: Not on file    Allergies:  Allergies  Allergen Reactions   Aspirin Anaphylaxis   Levaquin [Levofloxacin In D5w] Hives   Sulfa Antibiotics Other (See Comments)    Metabolic Disorder Labs: Lab Results  Component Value Date   HGBA1C 5.1 06/23/2019   No results found for: PROLACTIN Lab Results  Component Value Date   CHOL 206 (H) 09/19/2020   TRIG 75.0 09/19/2020   HDL 56.50 09/19/2020   CHOLHDL 4 09/19/2020   VLDL 15.0 09/19/2020   LDLCALC 135 (H) 09/19/2020   LDLCALC 153 (H) 01/04/2020   Lab Results  Component Value Date   TSH 0.68 06/23/2019   TSH 1.22 08/20/2018    Therapeutic Level Labs: No results found for: LITHIUM No results found for: VALPROATE No components found for:  CBMZ  Current Medications: Current Outpatient Medications  Medication Sig Dispense Refill   buPROPion (WELLBUTRIN XL) 150 MG 24 hr tablet 450 mg daily. Take along with 300 mg tab 30 tablet 5   buPROPion  (WELLBUTRIN XL) 300 MG 24 hr tablet 450 mg daily. Take along with 150 mg tab 30 tablet 5   [START ON 03/30/2021] Eszopiclone 3 MG TABS Take 1 tablet (3 mg total) by mouth at bedtime. Take immediately before bedtime 30 tablet 1   [START ON 03/30/2021] sertraline (ZOLOFT) 100 MG tablet Take 1.5 tablets (150 mg total) by mouth at bedtime. 45 tablet 1   No current facility-administered medications for this  visit.     Musculoskeletal: Strength & Muscle Tone:  N/A Gait & Station:  N/A Patient leans: N/A  Psychiatric Specialty Exam: Review of Systems  Psychiatric/Behavioral:  Positive for dysphoric mood and sleep disturbance. Negative for agitation, behavioral problems, confusion, decreased concentration, hallucinations, self-injury and suicidal ideas. The patient is nervous/anxious. The patient is not hyperactive.   All other systems reviewed and are negative.  There were no vitals taken for this visit.There is no height or weight on file to calculate BMI.  General Appearance: Fairly Groomed  Eye Contact:  Good  Speech:  Clear and Coherent  Volume:  Normal  Mood:   good  Affect:  Appropriate, Congruent, and euthymic  Thought Process:  Coherent  Orientation:  Full (Time, Place, and Person)  Thought Content: Logical   Suicidal Thoughts:  No  Homicidal Thoughts:  No  Memory:  Immediate;   Good  Judgement:  Good  Insight:  Good  Psychomotor Activity:  Normal  Concentration:  Concentration: Good and Attention Span: Good  Recall:  Good  Fund of Knowledge: Good  Language: Good  Akathisia:  No  Handed:  Right  AIMS (if indicated): not done  Assets:  Communication Skills Desire for Improvement  ADL's:  Intact  Cognition: WNL  Sleep:  Poor   Screenings: GAD-7    Flowsheet Row Office Visit from 11/21/2019 in [place]  Total GAD-7 Score 19      PHQ2-9    Flowsheet Row Video Visit from 07/12/2020 in [place] Office Visit from  03/21/2020 in [place] Office Visit from 11/21/2019 in [place] Office Visit from 08/22/2019 in [place] Video Visit from 11/29/2018 in [place]  PHQ-2 Total Score 2 1 2  0 0  PHQ-9 Total Score 6 4 16 8  0      Flowsheet Row Video Visit from 07/12/2020 in [place] No Risk        Assessment and Plan:  [name] is a 30 y.o. year old female with a history of depression, anxiety, PCOS , who presents for follow up appointment for below.    1. Panic disorder 3. MDD (major depressive disorder), recurrent episode, mild ([place]) Although she reports occasional depressed mood and anxiety, she has been handling things relatively well since the last visit.  Psychosocial stressors includes occasional conflict with her parents, and her father with medical health issues, financial strain, and trauma history as a teenager.  Will continue current dose of sertraline and bupropion to target depression/anxiety.  She will continue to see a therapist.   2. Insomnia, unspecified type She continues to have middle insomnia.  Will continue Lunesta as needed for insomnia.  She was able to see her sleep specialist, and was recommended for NPSG.  She was advised to contact the clinic for this appointment.   This clinician has discussed the side effect associated with medication prescribed during this encounter. Please refer to notes in the previous encounters for more details.    Plan Continue  Bupropion 450 mg daily 2. Continue sertraline 150 mg in the morning- monitor insomnia 3. Continue Lunesta 3 mg at night as needed for sleep 4. Next appointment: 1/11 at 11:30  for 30 mins, video     Past trials of medication: bupropion, sertraline, Xanax,    The patient demonstrates the following risk factors for suicide: Chronic risk factors for suicide include: psychiatric  disorder of anxiety and completed suicide in a family member. Acute risk factors for suicide include: family or marital conflict and loss (financial, interpersonal, professional). Protective factors for this patient include: positive social support, positive therapeutic relationship, responsibility to others (children, family), coping skills and hope for the future. Considering these factors, the overall suicide risk at this point appears to be low. Patient is appropriate for outpatient follow up.  Norman Clay, MD 03/12/2021, 5:18 PM

## 2021-03-12 ENCOUNTER — Encounter: Payer: Self-pay | Admitting: Psychiatry

## 2021-03-12 ENCOUNTER — Telehealth (INDEPENDENT_AMBULATORY_CARE_PROVIDER_SITE_OTHER): Payer: BC Managed Care – PPO | Admitting: Psychiatry

## 2021-03-12 ENCOUNTER — Other Ambulatory Visit: Payer: Self-pay

## 2021-03-12 DIAGNOSIS — F330 Major depressive disorder, recurrent, mild: Secondary | ICD-10-CM | POA: Diagnosis not present

## 2021-03-12 DIAGNOSIS — F410 Panic disorder [episodic paroxysmal anxiety] without agoraphobia: Secondary | ICD-10-CM | POA: Diagnosis not present

## 2021-03-12 DIAGNOSIS — G4700 Insomnia, unspecified: Secondary | ICD-10-CM

## 2021-03-12 MED ORDER — ESZOPICLONE 3 MG PO TABS: 3.0000 mg | ORAL_TABLET | Freq: Every day | ORAL | 1 refills | Status: DC

## 2021-03-12 MED ORDER — SERTRALINE HCL 100 MG PO TABS: 150.0000 mg | ORAL_TABLET | Freq: Every day | ORAL | 1 refills | Status: DC

## 2021-03-12 NOTE — Patient Instructions (Signed)
Continue  Bupropion 450 mg daily 2. Continue sertraline 150 mg in the morning 3. Continue Lunesta 3 mg at night as needed for sleep 4. Next appointment: 1/11 at 11:30

## 2021-04-02 DIAGNOSIS — F411 Generalized anxiety disorder: Secondary | ICD-10-CM | POA: Diagnosis not present

## 2021-04-02 DIAGNOSIS — F909 Attention-deficit hyperactivity disorder, unspecified type: Secondary | ICD-10-CM | POA: Diagnosis not present

## 2021-04-23 ENCOUNTER — Telehealth: Payer: Self-pay

## 2021-04-23 NOTE — Telephone Encounter (Signed)
Lm for patient to obtain date of sleep study. OV scheduled 04/24/2021.  If patient is not having any new or worsening sx, appt can be rescheduled until after sleep study.

## 2021-04-24 ENCOUNTER — Ambulatory Visit: Payer: BC Managed Care – PPO | Admitting: Primary Care

## 2021-04-24 NOTE — Telephone Encounter (Signed)
ATC--mailbox is full. Will close encounter per office protocol.

## 2021-05-07 NOTE — Progress Notes (Deleted)
[facility] MD/PA/NP OP Progress Note  05/07/2021 5:23 PM [name]  MRN:  [id number]  Chief Complaint:  HPI: *** Visit Diagnosis: No diagnosis found.  Past Psychiatric History: Please see initial evaluation for full details. I have reviewed the history. No updates at this time.     Past Medical History:  Past Medical History:  Diagnosis Date   Asthma    PCOS (polycystic ovarian syndrome)    UTI (urinary tract infection)     Past Surgical History:  Procedure Laterality Date   BREAST ENHANCEMENT SURGERY  2012   SHOULDER SURGERY     TONSILLECTOMY      Family Psychiatric History: Please see initial evaluation for full details. I have reviewed the history. No updates at this time.     Family History:  Family History  Problem Relation Age of Onset   Arthritis Mother    Heart disease Mother    Stroke Mother    Hypertension Mother    Mental illness Mother    Diabetes Mother    Factor V Leiden deficiency Mother    Sleep apnea Mother    Depression Mother    Drug abuse Mother    Mental illness Father    Diabetes Father    CAD Father    Sleep apnea Father    Bleeding Disorder Father        prothrombin gene mutuation   Bipolar disorder Father    Crohn's disease Brother    Kidney failure Paternal Grandfather    Suicidality Maternal Grandfather    Bipolar disorder Paternal Aunt    Clotting disorder Paternal Aunt    Clotting disorder Paternal Grandmother    Breast cancer Neg Hx    Ovarian cancer Neg Hx    Colon cancer Neg Hx     Social History:  Social History   Socioeconomic History   Marital status: Married    Spouse name: Not on file   Number of children: Not on file   Years of education: Not on file   Highest education level: Not on file  Occupational History   Not on file  Tobacco Use   Smoking status: Never   Smokeless tobacco: Never  Vaping Use   Vaping Use: Never used  Substance and Sexual Activity   Alcohol use: Not Currently   Drug use: No   Sexual  activity: Yes    Birth control/protection: Pill, None  Other Topics Concern   Not on file  Social History Narrative   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       Social Determinants of Health   Financial Resource Strain: Not on file  Food Insecurity: Not on file  Transportation Needs: Not on file  Physical Activity: Not on file  Stress: Not on file  Social Connections: Not on file    Allergies:  Allergies  Allergen Reactions   Aspirin Anaphylaxis   Levaquin [Levofloxacin In D5w] Hives   Sulfa Antibiotics Other (See Comments)    Metabolic Disorder Labs: Lab Results  Component Value Date   HGBA1C 5.1 06/23/2019   No results found for: PROLACTIN Lab Results  Component Value Date   CHOL 206 (H) 09/19/2020   TRIG 75.0 09/19/2020   HDL 56.50 09/19/2020   CHOLHDL 4 09/19/2020   VLDL 15.0 09/19/2020   LDLCALC 135 (H) 09/19/2020   LDLCALC 153 (H) 01/04/2020   Lab Results  Component Value Date   TSH 0.68 06/23/2019   TSH 1.22 08/20/2018  Therapeutic Level Labs: No results found for: LITHIUM No results found for: VALPROATE No components found for:  CBMZ  Current Medications: Current Outpatient Medications  Medication Sig Dispense Refill   buPROPion (WELLBUTRIN XL) 150 MG 24 hr tablet 450 mg daily. Take along with 300 mg tab 30 tablet 5   buPROPion (WELLBUTRIN XL) 300 MG 24 hr tablet 450 mg daily. Take along with 150 mg tab 30 tablet 5   Eszopiclone 3 MG TABS Take 1 tablet (3 mg total) by mouth at bedtime. Take immediately before bedtime 30 tablet 1   sertraline (ZOLOFT) 100 MG tablet Take 1.5 tablets (150 mg total) by mouth at bedtime. 45 tablet 1   No current facility-administered medications for this visit.     Musculoskeletal: Strength & Muscle Tone:  N/A Gait & Station:  N/A Patient leans: N/A  Psychiatric Specialty Exam: Review of Systems  There were no vitals taken for this visit.There is no height or weight on file to calculate  BMI.  General Appearance: {Appearance:22683}  Eye Contact:  {BHH EYE CONTACT:22684}  Speech:  Clear and Coherent  Volume:  Normal  Mood:  {BHH MOOD:22306}  Affect:  {Affect (PAA):22687}  Thought Process:  Coherent  Orientation:  Full (Time, Place, and Person)  Thought Content: Logical   Suicidal Thoughts:  {ST/HT (PAA):22692}  Homicidal Thoughts:  {ST/HT (PAA):22692}  Memory:  Immediate;   Good  Judgement:  {Judgement (PAA):22694}  Insight:  {Insight (PAA):22695}  Psychomotor Activity:  Normal  Concentration:  Concentration: Good and Attention Span: Good  Recall:  Good  Fund of Knowledge: Good  Language: Good  Akathisia:  No  Handed:  Right  AIMS (if indicated): not done  Assets:  Communication Skills Desire for Improvement  ADL's:  Intact  Cognition: WNL  Sleep:  {BHH GOOD/FAIR/POOR:22877}   Screenings: GAD-7    Flowsheet Row Office Visit from 11/21/2019 in [facility]  Total GAD-7 Score 19      PHQ2-9    Flowsheet Row Video Visit from 07/12/2020 in [facility] Office Visit from 03/21/2020 in [facility] Office Visit from 11/21/2019 in [facility] Office Visit from 08/22/2019 in [facility] Video Visit from 11/29/2018 in [facility]  PHQ-2 Total Score 2 1 2  0 0  PHQ-9 Total Score 6 4 16 8  0      Flowsheet Row Video Visit from 07/12/2020 in [facility] No Risk        Assessment and Plan:  [name] is a 31 y.o. year old female with a history of depression, anxiety, PCOS, who presents for follow up appointment for below.     1. Panic disorder 3. MDD (major depressive disorder), recurrent episode, mild ([place]) Although she reports occasional depressed mood and anxiety, she has been handling things relatively well since the last visit.  Psychosocial stressors includes occasional conflict  with her parents, and her father with medical health issues, financial strain, and trauma history as a teenager.  Will continue current dose of sertraline and bupropion to target depression/anxiety.  She will continue to see a therapist.    2. Insomnia, unspecified type She continues to have middle insomnia.  Will continue Lunesta as needed for insomnia.  She was able to see her sleep specialist, and was recommended for NPSG.  She was advised to contact the clinic for this appointment.    This clinician has discussed the side  effect associated with medication prescribed during this encounter. Please refer to notes in the previous encounters for more details.     Plan Continue  Bupropion 450 mg daily 2. Continue sertraline 150 mg in the morning- monitor insomnia 3. Continue Lunesta 3 mg at night as needed for sleep 4. Next appointment: 1/11 at 11:30  for 30 mins, video     Past trials of medication: bupropion, sertraline, Xanax,    The patient demonstrates the following risk factors for suicide: Chronic risk factors for suicide include: psychiatric disorder of anxiety and completed suicide in a family member. Acute risk factors for suicide include: family or marital conflict and loss (financial, interpersonal, professional). Protective factors for this patient include: positive social support, positive therapeutic relationship, responsibility to others (children, family), coping skills and hope for the future. Considering these factors, the overall suicide risk at this point appears to be low. Patient is appropriate for outpatient follow up.    Norman Clay, MD 05/07/2021, 5:23 PM

## 2021-05-08 ENCOUNTER — Telehealth: Payer: BC Managed Care – PPO | Admitting: Psychiatry

## 2021-05-08 ENCOUNTER — Other Ambulatory Visit: Payer: Self-pay

## 2021-05-20 DIAGNOSIS — F411 Generalized anxiety disorder: Secondary | ICD-10-CM | POA: Diagnosis not present

## 2021-05-20 DIAGNOSIS — F909 Attention-deficit hyperactivity disorder, unspecified type: Secondary | ICD-10-CM | POA: Diagnosis not present

## 2021-05-21 NOTE — Progress Notes (Signed)
Virtual Visit via Video Note  I connected with [name] on 05/23/21 at 10:30 AM EST by a video enabled telemedicine application and verified that I am speaking with the correct person using two identifiers.  Location: Patient: home Provider: office Persons participated in the visit- patient, provider    I discussed the limitations of evaluation and management by telemedicine and the availability of in person appointments. The patient expressed understanding and agreed to proceed.     I discussed the assessment and treatment plan with the patient. The patient was provided an opportunity to ask questions and all were answered. The patient agreed with the plan and demonstrated an understanding of the instructions.   The patient was advised to call back or seek an in-person evaluation if the symptoms worsen or if the condition fails to improve as anticipated.  I provided 20 minutes of non-face-to-face time during this encounter.   Norman Clay, MD    [facility] MD/PA/NP OP Progress Note  05/23/2021 11:12 AM [name]  MRN:  [id number]  Chief Complaint:  Chief Complaint   Follow-up; Depression    HPI:  This is a follow-up appointment for depression and anxiety.  She states that she was stressed as her father, who has bipolar disorder stopped taking medication.  He has more anger issues.  She also found out that she is pregnant.  She shared with them about this, and asked them to take care of themselves as she would not be able to continue taking care of them the way as she did.  Although she does not want them to think that she does not love them, she wants to take care of her baby.  She finds therapy to be very helpful to talk about things, and learning skills.  She feels excited about the pregnancy, although this makes her feel anxious, thinking about the house, and preparation for the baby.  She feels fatigue, and has less nausea.  She has been doing good at work.  She denies  feeling depressed.  She denies panic attacks except the time she had discussion with her parents.  She denies change in weight.  She denies SI.  She verbalized understanding about potential risk from her psychotropics.  She agrees to continue except Lunesta.  Although she feels worry about tapering off this medication, she agrees to do so at this time.   Employment: Psychologist, counselling in management team for 16 months, used to be on regional support position Support: her husband Household: [name], her husband Marital status: married for 14 years,  Number of children: 0 Education-  graduated from high school   Visit Diagnosis:    ICD-10-CM   1. MDD (major depressive disorder), recurrent, in partial remission ([place])  F33.41     2. Panic disorder  F41.0 sertraline (ZOLOFT) 100 MG tablet    3. Insomnia, unspecified type  G47.00       Past Psychiatric History: Please see initial evaluation for full details. I have reviewed the history. No updates at this time.     Past Medical History:  Past Medical History:  Diagnosis Date   Asthma    PCOS (polycystic ovarian syndrome)    UTI (urinary tract infection)     Past Surgical History:  Procedure Laterality Date   BREAST ENHANCEMENT SURGERY  2012   SHOULDER SURGERY     TONSILLECTOMY      Family Psychiatric History: Please see initial evaluation for full details. I have reviewed the  history. No updates at this time.     Family History:  Family History  Problem Relation Age of Onset   Arthritis Mother    Heart disease Mother    Stroke Mother    Hypertension Mother    Mental illness Mother    Diabetes Mother    Factor V Leiden deficiency Mother    Sleep apnea Mother    Depression Mother    Drug abuse Mother    Mental illness Father    Diabetes Father    CAD Father    Sleep apnea Father    Bleeding Disorder Father        prothrombin gene mutuation   Bipolar disorder Father    Crohn's disease Brother    Kidney failure  Paternal Grandfather    Suicidality Maternal Grandfather    Bipolar disorder Paternal Aunt    Clotting disorder Paternal Aunt    Clotting disorder Paternal Grandmother    Breast cancer Neg Hx    Ovarian cancer Neg Hx    Colon cancer Neg Hx     Social History:  Social History   Socioeconomic History   Marital status: Married    Spouse name: Not on file   Number of children: Not on file   Years of education: Not on file   Highest education level: Not on file  Occupational History   Not on file  Tobacco Use   Smoking status: Never   Smokeless tobacco: Never  Vaping Use   Vaping Use: Never used  Substance and Sexual Activity   Alcohol use: Not Currently   Drug use: No   Sexual activity: Yes    Birth control/protection: Pill, None  Other Topics Concern   Not on file  Social History Narrative   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       Social Determinants of Health   Financial Resource Strain: Not on file  Food Insecurity: Not on file  Transportation Needs: Not on file  Physical Activity: Not on file  Stress: Not on file  Social Connections: Not on file    Allergies:  Allergies  Allergen Reactions   Aspirin Anaphylaxis   Levaquin [Levofloxacin In D5w] Hives   Sulfa Antibiotics Other (See Comments)    Metabolic Disorder Labs: Lab Results  Component Value Date   HGBA1C 5.1 06/23/2019   No results found for: PROLACTIN Lab Results  Component Value Date   CHOL 206 (H) 09/19/2020   TRIG 75.0 09/19/2020   HDL 56.50 09/19/2020   CHOLHDL 4 09/19/2020   VLDL 15.0 09/19/2020   LDLCALC 135 (H) 09/19/2020   LDLCALC 153 (H) 01/04/2020   Lab Results  Component Value Date   TSH 0.68 06/23/2019   TSH 1.22 08/20/2018    Therapeutic Level Labs: No results found for: LITHIUM No results found for: VALPROATE No components found for:  CBMZ  Current Medications: Current Outpatient Medications  Medication Sig Dispense Refill   eszopiclone  (LUNESTA) 1 MG TABS tablet Take 2 tablets (2 mg total) by mouth at bedtime as needed for 7 days for sleep (insomnia), THEN 1 tablet (1 mg total) at bedtime as needed for up to 7 days for sleep (insomnia). Take immediately before bedtime. 21 tablet 0   buPROPion (WELLBUTRIN XL) 150 MG 24 hr tablet 450 mg daily. Take along with 300 mg tab 30 tablet 5   buPROPion (WELLBUTRIN XL) 300 MG 24 hr tablet 450 mg daily. Take along with 150 mg  tab 30 tablet 5   Eszopiclone 3 MG TABS Take 1 tablet (3 mg total) by mouth at bedtime. Take immediately before bedtime 30 tablet 1   [START ON 05/30/2021] sertraline (ZOLOFT) 100 MG tablet Take 1.5 tablets (150 mg total) by mouth at bedtime. 45 tablet 3   No current facility-administered medications for this visit.     Musculoskeletal: Strength & Muscle Tone:  N/A Gait & Station:  N/A Patient leans: N/A  Psychiatric Specialty Exam: Review of Systems  Psychiatric/Behavioral:  Negative for agitation, behavioral problems, confusion, decreased concentration, dysphoric mood, hallucinations, self-injury, sleep disturbance and suicidal ideas. The patient is nervous/anxious. The patient is not hyperactive.   All other systems reviewed and are negative.  There were no vitals taken for this visit.There is no height or weight on file to calculate BMI.  General Appearance: Fairly Groomed  Eye Contact:  Good  Speech:  Clear and Coherent  Volume:  Normal  Mood:  Anxious  Affect:  Appropriate, Congruent, and calm  Thought Process:  Coherent  Orientation:  Full (Time, Place, and Person)  Thought Content: Logical   Suicidal Thoughts:  No  Homicidal Thoughts:  No  Memory:  Immediate;   Good  Judgement:  Good  Insight:  Good  Psychomotor Activity:  Normal  Concentration:  Concentration: Good and Attention Span: Good  Recall:  Good  Fund of Knowledge: Good  Language: Good  Akathisia:  No  Handed:  Right  AIMS (if indicated): not done  Assets:  Communication  Skills Desire for Improvement  ADL's:  Intact  Cognition: WNL  Sleep:  Fair   Screenings: GAD-7    [place] Office Visit from 11/21/2019 in [facility]  Total GAD-7 Score 19      PHQ2-9    Flowsheet Row Video Visit from 07/12/2020 in [facility] Office Visit from 03/21/2020 in [facility] Office Visit from 11/21/2019 in [facility] Office Visit from 08/22/2019 in [facility] Video Visit from 11/29/2018 in [place] [facility]  PHQ-2 Total Score 2 1 2  0 0  PHQ-9 Total Score 6 4 16 8  0      Flowsheet Row Video Visit from 07/12/2020 in [facility] No Risk        Assessment and Plan:  MAILAH RIERSON is a 31 y.o. year old female with a history of  depression, anxiety, PCOS, who presents for follow up appointment for below.   1. Panic disorder 2. MDD (major depressive disorder), recurrent, in partial remission ([place]) Although she reports worsening in and anxiety in the recent incident with her parents, and pregnancy, she has been hunching since relatively good since her last visit.  Psychosocial stressors includes conflicts with her parents including her father, who has bipolar disorder, financial strain, and trauma history as a teenager.  Will continue current dose of sertraline and bupropion as maintenance treatment for depression and anxiety. Discussed risk of using SSRI and bupropion during pregnancy includes but not limited to teratogenesis, spontaneous abortion persistent pulmonary hypertension in newborns. Given benefits of treating underlying mood symptoms outweighs risks, will continue current medication regimen.  She agrees with this.   3. Insomnia, unspecified type She was seen by a sleep specialist, and was recommended for an NPSG.  Although she has had a good benefit from [place], will plan to taper off this  medication due to its potential risk during pregnancy.  Discussed  potential risk of rebound insomnia.   This clinician has discussed the side effect associated with medication prescribed during this encounter. Please refer to notes in the previous encounters for more details.    Plan Continue  Bupropion 450 mg daily Continue sertraline 150 mg in the morning- monitor insomnia Decrease Lunesta 2 mg at night for 1 week, then 1 mg for a week, then discontinue Next appointment: 2/28 at 10 AM for 30 mins, in person  Past trials of medication: bupropion, sertraline, Xanax,    The patient demonstrates the following risk factors for suicide: Chronic risk factors for suicide include: psychiatric disorder of anxiety and completed suicide in a family member. Acute risk factors for suicide include: family or marital conflict and loss (financial, interpersonal, professional). Protective factors for this patient include: positive social support, positive therapeutic relationship, responsibility to others (children, family), coping skills and hope for the future. Considering these factors, the overall suicide risk at this point appears to be low. Patient is appropriate for outpatient follow up.  Norman Clay, MD 05/23/2021, 11:12 AM

## 2021-05-23 ENCOUNTER — Telehealth: Payer: Self-pay

## 2021-05-23 ENCOUNTER — Telehealth: Payer: BC Managed Care – PPO | Admitting: Psychiatry

## 2021-05-23 ENCOUNTER — Encounter: Payer: Self-pay | Admitting: Psychiatry

## 2021-05-23 ENCOUNTER — Other Ambulatory Visit: Payer: Self-pay

## 2021-05-23 DIAGNOSIS — G4700 Insomnia, unspecified: Secondary | ICD-10-CM

## 2021-05-23 DIAGNOSIS — F3341 Major depressive disorder, recurrent, in partial remission: Secondary | ICD-10-CM | POA: Diagnosis not present

## 2021-05-23 DIAGNOSIS — F410 Panic disorder [episodic paroxysmal anxiety] without agoraphobia: Secondary | ICD-10-CM

## 2021-05-23 DIAGNOSIS — N912 Amenorrhea, unspecified: Secondary | ICD-10-CM | POA: Diagnosis not present

## 2021-05-23 MED ORDER — SERTRALINE HCL 100 MG PO TABS: 150.0000 mg | ORAL_TABLET | Freq: Every day | ORAL | 3 refills | Status: DC

## 2021-05-23 MED ORDER — ESZOPICLONE 1 MG PO TABS: ORAL_TABLET | ORAL | 0 refills | Status: DC

## 2021-05-23 NOTE — Patient Instructions (Signed)
Continue  Bupropion 450 mg daily Continue sertraline 150 mg in the morning- monitor insomnia Decrease Lunesta 2 mg at night for 1 week, then 1 mg for a week, then discontinue Next appointment: 2/28 at 10 AM, in person  The next visit will be in person visit. Please arrive 15 mins before the scheduled time.   [facility]  Address: [address], [place], [place] [address]

## 2021-05-23 NOTE — Telephone Encounter (Signed)
pt states she like to speak with you about the lunesta and her pregnacy.  she needs something to sleep and she wanted to talk with you about other options.

## 2021-05-23 NOTE — Telephone Encounter (Signed)
Called her and left voice message to contact our office. I would recommend that she would first come off from [place] as discussed today. If she is interested, Trazodone may be considered in the future.

## 2021-05-24 DIAGNOSIS — N912 Amenorrhea, unspecified: Secondary | ICD-10-CM | POA: Diagnosis not present

## 2021-05-27 NOTE — Telephone Encounter (Signed)
Mail box is full can not leave any message I have tired twice .  Closing note

## 2021-06-12 ENCOUNTER — Ambulatory Visit: Payer: BC Managed Care – PPO | Admitting: Primary Care

## 2021-06-12 ENCOUNTER — Ambulatory Visit: Payer: BC Managed Care – PPO

## 2021-06-21 NOTE — Progress Notes (Unsigned)
BH MD/PA/NP OP Progress Note  06/21/2021 10:59 AM [name]  MRN:  [id number]  Chief Complaint: No chief complaint on file.  HPI: *** Visit Diagnosis: No diagnosis found.  Past Psychiatric History: Please see initial evaluation for full details. I have reviewed the history. No updates at this time.     Past Medical History:  Past Medical History:  Diagnosis Date   Asthma    PCOS (polycystic ovarian syndrome)    UTI (urinary tract infection)     Past Surgical History:  Procedure Laterality Date   BREAST ENHANCEMENT SURGERY  2012   SHOULDER SURGERY     TONSILLECTOMY      Family Psychiatric History: Please see initial evaluation for full details. I have reviewed the history. No updates at this time.     Family History:  Family History  Problem Relation Age of Onset   Arthritis Mother    Heart disease Mother    Stroke Mother    Hypertension Mother    Mental illness Mother    Diabetes Mother    Factor V Leiden deficiency Mother    Sleep apnea Mother    Depression Mother    Drug abuse Mother    Mental illness Father    Diabetes Father    CAD Father    Sleep apnea Father    Bleeding Disorder Father        prothrombin gene mutuation   Bipolar disorder Father    Crohn's disease Brother    Kidney failure Paternal Grandfather    Suicidality Maternal Grandfather    Bipolar disorder Paternal Aunt    Clotting disorder Paternal Aunt    Clotting disorder Paternal Grandmother    Breast cancer Neg Hx    Ovarian cancer Neg Hx    Colon cancer Neg Hx     Social History:  Social History   Socioeconomic History   Marital status: Married    Spouse name: Not on file   Number of children: Not on file   Years of education: Not on file   Highest education level: Not on file  Occupational History   Not on file  Tobacco Use   Smoking status: Never   Smokeless tobacco: Never  Vaping Use   Vaping Use: Never used  Substance and Sexual Activity   Alcohol use: Not  Currently   Drug use: No   Sexual activity: Yes    Birth control/protection: Pill, None  Other Topics Concern   Not on file  Social History Narrative   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       Social Determinants of Health   Financial Resource Strain: Not on file  Food Insecurity: Not on file  Transportation Needs: Not on file  Physical Activity: Not on file  Stress: Not on file  Social Connections: Not on file    Allergies:  Allergies  Allergen Reactions   Aspirin Anaphylaxis   Levaquin [Levofloxacin In D5w] Hives   Sulfa Antibiotics Other (See Comments)    Metabolic Disorder Labs: Lab Results  Component Value Date   HGBA1C 5.1 06/23/2019   No results found for: PROLACTIN Lab Results  Component Value Date   CHOL 206 (H) 09/19/2020   TRIG 75.0 09/19/2020   HDL 56.50 09/19/2020   CHOLHDL 4 09/19/2020   VLDL 15.0 09/19/2020   LDLCALC 135 (H) 09/19/2020   LDLCALC 153 (H) 01/04/2020   Lab Results  Component Value Date   TSH 0.68 06/23/2019  TSH 1.22 08/20/2018    Therapeutic Level Labs: No results found for: LITHIUM No results found for: VALPROATE No components found for:  CBMZ  Current Medications: Current Outpatient Medications  Medication Sig Dispense Refill   buPROPion (WELLBUTRIN XL) 150 MG 24 hr tablet 450 mg daily. Take along with 300 mg tab 30 tablet 5   buPROPion (WELLBUTRIN XL) 300 MG 24 hr tablet 450 mg daily. Take along with 150 mg tab 30 tablet 5   eszopiclone (LUNESTA) 1 MG TABS tablet Take 2 tablets (2 mg total) by mouth at bedtime as needed for 7 days for sleep (insomnia), THEN 1 tablet (1 mg total) at bedtime as needed for up to 7 days for sleep (insomnia). Take immediately before bedtime. 21 tablet 0   Eszopiclone 3 MG TABS Take 1 tablet (3 mg total) by mouth at bedtime. Take immediately before bedtime 30 tablet 1   sertraline (ZOLOFT) 100 MG tablet Take 1.5 tablets (150 mg total) by mouth at bedtime. 45 tablet 3    No current facility-administered medications for this visit.     Musculoskeletal: Strength & Muscle Tone: within normal limits Gait & Station: normal Patient leans: N/A  Psychiatric Specialty Exam: Review of Systems  There were no vitals taken for this visit.There is no height or weight on file to calculate BMI.  General Appearance: {Appearance:22683}  Eye Contact:  {BHH EYE CONTACT:22684}  Speech:  Clear and Coherent  Volume:  Normal  Mood:  {BHH MOOD:22306}  Affect:  {Affect (PAA):22687}  Thought Process:  Coherent  Orientation:  Full (Time, Place, and Person)  Thought Content: Logical   Suicidal Thoughts:  {ST/HT (PAA):22692}  Homicidal Thoughts:  {ST/HT (PAA):22692}  Memory:  Immediate;   Good  Judgement:  {Judgement (PAA):22694}  Insight:  {Insight (PAA):22695}  Psychomotor Activity:  Normal  Concentration:  Concentration: Good and Attention Span: Good  Recall:  Good  Fund of Knowledge: Good  Language: Good  Akathisia:  No  Handed:  Right  AIMS (if indicated): not done  Assets:  Communication Skills Desire for Improvement  ADL's:  Intact  Cognition: WNL  Sleep:  {BHH GOOD/FAIR/POOR:22877}   Screenings: GAD-7    Flowsheet Row Office Visit from 11/21/2019 in [place]  Total GAD-7 Score 19      PHQ2-9    Flowsheet Row Video Visit from 07/12/2020 in [place] Office Visit from 03/21/2020 in [place] Office Visit from 11/21/2019 in [place] Office Visit from 08/22/2019 in [place] Video Visit from 11/29/2018 in [place]  PHQ-2 Total Score 2 1 2  0 0  PHQ-9 Total Score 6 4 16 8  0      Flowsheet Row Video Visit from 07/12/2020 in [place] No Risk        Assessment and Plan:  [name] is a 31 y.o. year old female with a history of depression, anxiety, PCOS,  who presents for follow up appointment for below.    1. Panic disorder 2. MDD (major depressive disorder), recurrent, in partial remission ([place]) Although she reports worsening in and anxiety in the recent incident with her parents, and pregnancy, she has been hunching since relatively good since her last visit.  Psychosocial stressors includes conflicts with her parents including her father, who has bipolar disorder, financial strain, and trauma history as a teenager.  Will continue current dose of sertraline and bupropion as maintenance  treatment for depression and anxiety. Discussed risk of using SSRI and bupropion during pregnancy includes but not limited to teratogenesis, spontaneous abortion persistent pulmonary hypertension in newborns. Given benefits of treating underlying mood symptoms outweighs risks, will continue current medication regimen.  She agrees with this.    3. Insomnia, unspecified type She was seen by a sleep specialist, and was recommended for an NPSG.  Although she has had a good benefit from [place], will plan to taper off this medication due to its potential risk during pregnancy.  Discussed potential risk of rebound insomnia.    This clinician has discussed the side effect associated with medication prescribed during this encounter. Please refer to notes in the previous encounters for more details.     Plan Continue  Bupropion 450 mg daily Continue sertraline 150 mg in the morning- monitor insomnia Decrease Lunesta 2 mg at night for 1 week, then 1 mg for a week, then discontinue Next appointment: 2/28 at 10 AM for 30 mins, in person   Past trials of medication: bupropion, sertraline, Xanax,    The patient demonstrates the following risk factors for suicide: Chronic risk factors for suicide include: psychiatric disorder of anxiety and completed suicide in a family member. Acute risk factors for suicide include: family or marital conflict and loss (financial, interpersonal,  professional). Protective factors for this patient include: positive social support, positive therapeutic relationship, responsibility to others (children, family), coping skills and hope for the future. Considering these factors, the overall suicide risk at this point appears to be low. Patient is appropriate for outpatient follow up.    Collaboration of Care: Collaboration of Care: {BH OP Collaboration of Care:21014065}  Patient/Guardian was advised Release of Information must be obtained prior to any record release in order to collaborate their care with an outside provider. Patient/Guardian was advised if they have not already done so to contact the registration department to sign all necessary forms in order for [place] to release information regarding their care.   Consent: Patient/Guardian gives verbal consent for treatment and assignment of benefits for services provided during this visit. Patient/Guardian expressed understanding and agreed to proceed.    Norman Clay, MD 06/21/2021, 10:59 AM

## 2021-06-25 ENCOUNTER — Ambulatory Visit: Payer: BC Managed Care – PPO | Admitting: Psychiatry

## 2021-06-25 DIAGNOSIS — Z124 Encounter for screening for malignant neoplasm of cervix: Secondary | ICD-10-CM | POA: Diagnosis not present

## 2021-06-25 DIAGNOSIS — Z113 Encounter for screening for infections with a predominantly sexual mode of transmission: Secondary | ICD-10-CM | POA: Diagnosis not present

## 2021-06-25 DIAGNOSIS — O0991 Supervision of high risk pregnancy, unspecified, first trimester: Secondary | ICD-10-CM | POA: Diagnosis not present

## 2021-07-16 ENCOUNTER — Ambulatory Visit: Payer: BC Managed Care – PPO | Admitting: Primary Care

## 2021-07-23 DIAGNOSIS — O0991 Supervision of high risk pregnancy, unspecified, first trimester: Secondary | ICD-10-CM | POA: Diagnosis not present

## 2021-07-23 DIAGNOSIS — E282 Polycystic ovarian syndrome: Secondary | ICD-10-CM | POA: Diagnosis not present

## 2021-07-29 ENCOUNTER — Ambulatory Visit: Payer: BC Managed Care – PPO | Admitting: Psychiatry

## 2021-07-29 NOTE — Progress Notes (Deleted)
BH MD/PA/NP OP Progress Note ? ?07/29/2021 7:58 AM ?[name]  ?MRN:  [id number] ? ?Chief Complaint: No chief complaint on file. ? ?HPI: *** ?Visit Diagnosis: No diagnosis found. ? ?Past Psychiatric History: Please see initial evaluation for full details. I have reviewed the history. No updates at this time.  ?  ? ?Past Medical History:  ?Past Medical History:  ?Diagnosis Date  ? Asthma   ? PCOS (polycystic ovarian syndrome)   ? UTI (urinary tract infection)   ?  ?Past Surgical History:  ?Procedure Laterality Date  ? BREAST ENHANCEMENT SURGERY  2012  ? SHOULDER SURGERY    ? TONSILLECTOMY    ? ? ?Family Psychiatric History: Please see initial evaluation for full details. I have reviewed the history. No updates at this time.  ?  ? ?Family History:  ?Family History  ?Problem Relation Age of Onset  ? Arthritis Mother   ? Heart disease Mother   ? Stroke Mother   ? Hypertension Mother   ? Mental illness Mother   ? Diabetes Mother   ? Factor V Leiden deficiency Mother   ? Sleep apnea Mother   ? Depression Mother   ? Drug abuse Mother   ? Mental illness Father   ? Diabetes Father   ? CAD Father   ? Sleep apnea Father   ? Bleeding Disorder Father   ?     prothrombin gene mutuation  ? Bipolar disorder Father   ? Crohn's disease Brother   ? Kidney failure Paternal Grandfather   ? Suicidality Maternal Grandfather   ? Bipolar disorder Paternal Aunt   ? Clotting disorder Paternal Aunt   ? Clotting disorder Paternal Grandmother   ? Breast cancer Neg Hx   ? Ovarian cancer Neg Hx   ? Colon cancer Neg Hx   ? ? ?Social History:  ?Social History  ? ?Socioeconomic History  ? Marital status: Married  ?  Spouse name: Not on file  ? Number of children: Not on file  ? Years of education: Not on file  ? Highest education level: Not on file  ?Occupational History  ? Not on file  ?Tobacco Use  ? Smoking status: Never  ? Smokeless tobacco: Never  ?Vaping Use  ? Vaping Use: Never used  ?Substance and Sexual Activity  ? Alcohol use: Not  Currently  ? Drug use: No  ? Sexual activity: Yes  ?  Birth control/protection: Pill, None  ?Other Topics Concern  ? Not on file  ?Social History Narrative  ? 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 Lives with husband.   ?   ? ?Social Determinants of Health  ? ?Financial Resource Strain: Not on file  ?Food Insecurity: Not on file  ?Transportation Needs: Not on file  ?Physical Activity: Not on file  ?Stress: Not on file  ?Social Connections: Not on file  ? ? ?Allergies:  ?Allergies  ?Allergen Reactions  ? Aspirin Anaphylaxis  ? Levaquin [Levofloxacin In D5w] Hives  ? Sulfa Antibiotics Other (See Comments)  ? ? ?Metabolic Disorder Labs: ?Lab Results  ?Component Value Date  ? HGBA1C 5.1 06/23/2019  ? ?No results found for: PROLACTIN ?Lab Results  ?Component Value Date  ? CHOL 206 (H) 09/19/2020  ? TRIG 75.0 09/19/2020  ? HDL 56.50 09/19/2020  ? CHOLHDL 4 09/19/2020  ? VLDL 15.0 09/19/2020  ? LDLCALC 135 (H) 09/19/2020  ? LDLCALC 153 (H) 01/04/2020  ? ?Lab Results  ?Component Value Date  ? TSH 0.68 06/23/2019  ?  TSH 1.22 08/20/2018  ? ? ?Therapeutic Level Labs: ?No results found for: LITHIUM ?No results found for: VALPROATE ?No components found for:  CBMZ ? ?Current Medications: ?Current Outpatient Medications  ?Medication Sig Dispense Refill  ? buPROPion (WELLBUTRIN XL) 150 MG 24 hr tablet 450 mg daily. Take along with 300 mg tab 30 tablet 5  ? buPROPion (WELLBUTRIN XL) 300 MG 24 hr tablet 450 mg daily. Take along with 150 mg tab 30 tablet 5  ? eszopiclone (LUNESTA) 1 MG TABS tablet Take 2 tablets (2 mg total) by mouth at bedtime as needed for 7 days for sleep (insomnia), THEN 1 tablet (1 mg total) at bedtime as needed for up to 7 days for sleep (insomnia). Take immediately before bedtime. 21 tablet 0  ? Eszopiclone 3 MG TABS Take 1 tablet (3 mg total) by mouth at bedtime. Take immediately before bedtime 30 tablet 1  ? sertraline (ZOLOFT) 100 MG tablet Take 1.5 tablets (150 mg total) by mouth at bedtime. 45 tablet 3   ? ?No current facility-administered medications for this visit.  ? ? ? ?Musculoskeletal: ?Strength & Muscle Tone: within normal limits ?Gait & Station: normal ?Patient leans: N/A ? ?Psychiatric Specialty Exam: ?Review of Systems  ?There were no vitals taken for this visit.There is no height or weight on file to calculate BMI.  ?General Appearance: {Appearance:22683}  ?Eye Contact:  {BHH EYE CONTACT:22684}  ?Speech:  Clear and Coherent  ?Volume:  Normal  ?Mood:  {BHH MOOD:22306}  ?Affect:  {Affect (PAA):22687}  ?Thought Process:  Coherent  ?Orientation:  Full (Time, Place, and Person)  ?Thought Content: Logical   ?Suicidal Thoughts:  {ST/HT (PAA):22692}  ?Homicidal Thoughts:  {ST/HT (PAA):22692}  ?Memory:  Immediate;   Good  ?Judgement:  {Judgement (PAA):22694}  ?Insight:  {Insight (PAA):22695}  ?Psychomotor Activity:  Normal  ?Concentration:  Concentration: Good and Attention Span: Good  ?Recall:  Good  ?Fund of Knowledge: Good  ?Language: Good  ?Akathisia:  No  ?Handed:  Right  ?AIMS (if indicated): not done  ?Assets:  Communication Skills ?Desire for Improvement  ?ADL's:  Intact  ?Cognition: WNL  ?Sleep:  {BHH GOOD/FAIR/POOR:22877}  ? ?Screenings: ?GAD-7   ? ?Flowsheet Row Office Visit from 11/21/2019 in [facility]  ?Total GAD-7 Score 19  ? ?  ? ?PHQ2-9   ? ?Flowsheet Row Video Visit from 07/12/2020 in [facility] Office Visit from 03/21/2020 in [facility] Office Visit from 11/21/2019 in [facility] Office Visit from 08/22/2019 in [facility] Video Visit from 11/29/2018 in [facility]  ?PHQ-2 Total Score 2 1 2  0 0  ?PHQ-9 Total Score 6 4 16 8  0  ? ?  ? ?Flowsheet Row Video Visit from 07/12/2020 in [facility]  ?C-SSRS RISK CATEGORY No Risk  ? ?  ? ? ? ?Assessment and Plan:  ?[name] is a 31 y.o. year old female with a history of  depression, anxiety, PCOS,  who presents for follow up appointment for below.  ? ?1. Panic disorder ?2. MDD (major depressive disorder), recurrent, in partial remission ([facility]) ?Although she reports worsening in and anxiety in the recent incident with her parents, and pregnancy, she has been hunching since relatively good since her last visit.  Psychosocial stressors includes conflicts with her parents including her father, who has bipolar disorder, financial strain, and trauma history as a teenager.  Will continue current dose of sertraline and bupropion as maintenance  treatment for depression and anxiety. Discussed risk of using SSRI and bupropion during pregnancy includes but not limited to teratogenesis, spontaneous abortion persistent pulmonary hypertension in newborns. Given benefits of treating underlying mood symptoms outweighs risks, will continue current medication regimen.  She agrees with this.  ?  ?3. Insomnia, unspecified type ?She was seen by a sleep specialist, and was recommended for an NPSG.  Although she has had a good benefit from [place], will plan to taper off this medication due to its potential risk during pregnancy.  Discussed potential risk of rebound insomnia.  ?  ?This clinician has discussed the side effect associated with medication prescribed during this encounter. Please refer to notes in the previous encounters for more details.   ?  ?Plan ?Continue  Bupropion 450 mg daily ?Continue sertraline 150 mg in the morning- monitor insomnia ?Decrease Lunesta 2 mg at night for 1 week, then 1 mg for a week, then discontinue ?Next appointment: 2/28 at 10 AM for 30 mins, in person ?  ?Past trials of medication: bupropion, sertraline, Xanax,  ?  ?The patient demonstrates the following risk factors for suicide: Chronic risk factors for suicide include: psychiatric disorder of anxiety and completed suicide in a family member. Acute risk factors for suicide include: family or marital conflict and loss (financial, interpersonal,  professional). Protective factors for this patient include: positive social support, positive therapeutic relationship, responsibility to others (children, family), coping skills and hope for the future. Consid

## 2021-08-09 DIAGNOSIS — R7309 Other abnormal glucose: Secondary | ICD-10-CM | POA: Diagnosis not present

## 2021-08-24 ENCOUNTER — Telehealth: Payer: Self-pay | Admitting: Obstetrics and Gynecology

## 2021-08-24 NOTE — Telephone Encounter (Signed)
Pt called with c/o upper abdominal pain near her ribcage that radiates inwards across her upper belly. Denies nausea, vomiting, fever or any other sx.  ? ?EDC 01/16/22; [place] [redacted]w[redacted]d  ?- no vaginal bleeding, LOF. Has felt her baby move, but only a few times.  ? ?Prenatal care: [facility] ? ?Preg complicated by: ?1. Mental health Dx: major depressive d/o ?? Followed by: [facility]  ?? Pre-Preg meds: Bupropion 450 mg daily; sertraline 150 mg; Lunesta ?? Preg changes: continue buproprion and sertraline, has discontinued Lunesta at [facility].  ?2. Hx PCOS, started Metformin per [facility] 09/2020- stopped prior to conception.  ?? A1C 5.0, and early GTT:194 with [facility] labs ?? 3 HR GTT: [phone] ?3. Hx chronic pelvic pain and dyspareunia ?4. Heterozygous Prothrombin G20210A mutation:  ?? seen by MFM for consult, needs PP lovenox for 6 weeks.  ?

## 2021-08-29 DIAGNOSIS — R1011 Right upper quadrant pain: Secondary | ICD-10-CM | POA: Diagnosis not present

## 2021-08-29 DIAGNOSIS — O0992 Supervision of high risk pregnancy, unspecified, second trimester: Secondary | ICD-10-CM | POA: Diagnosis not present

## 2021-09-03 DIAGNOSIS — R1011 Right upper quadrant pain: Secondary | ICD-10-CM | POA: Diagnosis not present

## 2021-10-24 DIAGNOSIS — O0992 Supervision of high risk pregnancy, unspecified, second trimester: Secondary | ICD-10-CM | POA: Diagnosis not present

## 2021-12-06 ENCOUNTER — Ambulatory Visit: Payer: BC Managed Care – PPO | Admitting: Family

## 2021-12-06 ENCOUNTER — Encounter: Payer: Self-pay | Admitting: Family

## 2021-12-06 DIAGNOSIS — F32A Depression, unspecified: Secondary | ICD-10-CM | POA: Diagnosis not present

## 2021-12-06 DIAGNOSIS — F419 Anxiety disorder, unspecified: Secondary | ICD-10-CM | POA: Diagnosis not present

## 2021-12-06 DIAGNOSIS — F410 Panic disorder [episodic paroxysmal anxiety] without agoraphobia: Secondary | ICD-10-CM

## 2021-12-06 MED ORDER — SERTRALINE HCL 50 MG PO TABS: 50.0000 mg | ORAL_TABLET | Freq: Every day | ORAL | 1 refills | Status: DC

## 2021-12-06 MED ORDER — BUPROPION HCL ER (XL) 150 MG PO TB24: 150.0000 mg | ORAL_TABLET | ORAL | 1 refills | Status: DC

## 2021-12-06 NOTE — Patient Instructions (Signed)
Resume Zoloft 50 mg, Wellbutrin 150 mg  Congratulations and let me  know how you are doing!

## 2021-12-06 NOTE — Assessment & Plan Note (Signed)
Together we reviewed a fax sheet from mother to [email or web address] in regards to Wellbutrin, Zoloft and risk to mother and baby.  Discussed risk of birth defects, low infant birth weight She feels comfortable with starting Zoloft and Wellbutrin which has  historically been quite helpful for her.  Agreed to resume at low doses and titrate as necessary.  Start Zoloft 50 mg, Wellbutrin 150 mg.

## 2021-12-06 NOTE — Progress Notes (Signed)
Subjective:    Patient ID: [name], female    DOB: 04-Aug-1990, 31 y.o.   MRN: [id number]  CC: [name] is a 31 y.o. female who presents today for follow up.   HPI: Complains of increased anxiety and depression  Unintentionally discontinued wellbutrin, zoloft a couple of months ago.  She desires to resume medications as previously been very helpful for her.  She also notes that she has been diagnosed with ADHD.  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  She is less social and has bouts of anger.   No si/hi Trouble staying asleep. She has used unisom without relief.   No si/hi.     No h/o seizure, eating disorder.  No alcohol use.  She is expecting, 34 weeks. Following with Dr Feliberto Gottron  Previously followed with Dr Vanetta Shawl, last seen 05/23/21 . At that time, she was on  zoloft 150mg , and wellbutrin 450mg . She tapered off of lunesta   HISTORY:  Past Medical History:  Diagnosis Date   Asthma    PCOS (polycystic ovarian syndrome)    UTI (urinary tract infection)    Past Surgical History:  Procedure Laterality Date   BREAST ENHANCEMENT SURGERY  2012   SHOULDER SURGERY     TONSILLECTOMY     Family History  Problem Relation Age of Onset   Arthritis Mother    Heart disease Mother    Stroke Mother    Hypertension Mother    Mental illness Mother    Diabetes Mother    Factor V Leiden deficiency Mother    Sleep apnea Mother    Depression Mother    Drug abuse Mother    Mental illness Father    Diabetes Father    CAD Father    Sleep apnea Father    Bleeding Disorder Father        prothrombin gene mutuation   Bipolar disorder Father    Crohn's disease Brother    Kidney failure Paternal Grandfather    Suicidality Maternal Grandfather    Bipolar disorder Paternal Aunt    Clotting disorder Paternal Aunt    Clotting disorder Paternal Grandmother    Breast cancer Neg Hx    Ovarian cancer Neg Hx    Colon cancer Neg Hx     Allergies: Aspirin,  Levaquin [levofloxacin in d5w], and Sulfa antibiotics Current Outpatient Medications on File Prior to Visit  Medication Sig Dispense Refill   Eszopiclone 3 MG TABS Take 1 tablet (3 mg total) by mouth at bedtime. Take immediately before bedtime 30 tablet 1   No current facility-administered medications on file prior to visit.    Social History   Tobacco Use   Smoking status: Never   Smokeless tobacco: Never  Vaping Use   Vaping Use: Never used  Substance Use Topics   Alcohol use: Not Currently   Drug use: No    Review of Systems  Constitutional:  Negative for chills and fever.  Respiratory:  Negative for cough.   Cardiovascular:  Negative for chest pain and palpitations.  Gastrointestinal:  Negative for nausea and vomiting.  Psychiatric/Behavioral:  Positive for sleep disturbance. Negative for suicidal ideas. The patient is nervous/anxious.       Objective:    BP 118/70 (BP Location: Left Arm, Patient Position: Sitting, Cuff Size: Normal)   Pulse (!) 117   Temp 97.7 F (36.5 C) (Oral)   Ht 5\' 7"  (1.702 m)   Wt 189 lb 9.6 oz (86 kg)  LMP  (LMP Unknown)   SpO2 98%   BMI 29.70 kg/m  BP Readings from Last 3 Encounters:  12/06/21 118/70  02/27/21 100/80  03/21/20 104/62   Wt Readings from Last 3 Encounters:  12/06/21 189 lb 9.6 oz (86 kg)  02/27/21 143 lb 9.6 oz (65.1 kg)  03/21/20 139 lb 6.4 oz (63.2 kg)    Physical Exam Vitals reviewed.  Constitutional:      Appearance: She is well-developed.  Eyes:     Conjunctiva/sclera: Conjunctivae normal.  Cardiovascular:     Rate and Rhythm: Normal rate and regular rhythm.     Pulses: Normal pulses.     Heart sounds: Normal heart sounds.  Pulmonary:     Effort: Pulmonary effort is normal.     Breath sounds: Normal breath sounds. No wheezing, rhonchi or rales.  Skin:    General: Skin is warm and dry.  Neurological:     Mental Status: She is alert.  Psychiatric:        Speech: Speech normal.        Behavior:  Behavior normal.        Thought Content: Thought content normal.        Assessment & Plan:   Problem List Items Addressed This Visit       Other   Anxiety and depression    Together we reviewed a fax sheet from mother to [email or web address] in regards to Wellbutrin, Zoloft and risk to mother and baby.  Discussed risk of birth defects, low infant birth weight She feels comfortable with starting Zoloft and Wellbutrin which has  historically been quite helpful for her.  Agreed to resume at low doses and titrate as necessary.  Start Zoloft 50 mg, Wellbutrin 150 mg.      Relevant Medications   buPROPion (WELLBUTRIN XL) 150 MG 24 hr tablet   sertraline (ZOLOFT) 50 MG tablet   Other Visit Diagnoses     Panic disorder       Relevant Medications   buPROPion (WELLBUTRIN XL) 150 MG 24 hr tablet   sertraline (ZOLOFT) 50 MG tablet        I have discontinued Ronan C. Nawrot's buPROPion. I have also changed her buPROPion and sertraline. Additionally, I am having her maintain her Eszopiclone.   Meds ordered this encounter  Medications   buPROPion (WELLBUTRIN XL) 150 MG 24 hr tablet    Sig: Take 1 tablet (150 mg total) by mouth every morning.    Dispense:  90 tablet    Refill:  1    Fill after 9/16    Order Specific Question:   Supervising Provider    Answer:   Duncan Dull L [[id number]]   sertraline (ZOLOFT) 50 MG tablet    Sig: Take 1 tablet (50 mg total) by mouth at bedtime.    Dispense:  90 tablet    Refill:  1    Order Specific Question:   Supervising Provider    Answer:   Sherlene Shams [[id number]]    Return precautions given.   Risks, benefits, and alternatives of the medications and treatment plan prescribed today were discussed, and patient expressed understanding.   Education regarding symptom management and diagnosis given to patient on AVS.  Continue to follow with [name], FNP for routine health maintenance.   [name] and I agreed with plan.   Rennie Plowman,  FNP

## 2021-12-06 NOTE — Progress Notes (Signed)
Patient is not taking the wellbutrin or zoloft now. Would like to start back but wanted to discuss with you first?

## 2021-12-19 DIAGNOSIS — O0903 Supervision of pregnancy with history of infertility, third trimester: Secondary | ICD-10-CM | POA: Diagnosis not present

## 2021-12-19 DIAGNOSIS — Z113 Encounter for screening for infections with a predominantly sexual mode of transmission: Secondary | ICD-10-CM | POA: Diagnosis not present

## 2022-01-10 DIAGNOSIS — R519 Headache, unspecified: Secondary | ICD-10-CM | POA: Diagnosis not present

## 2022-01-10 DIAGNOSIS — O26899 Other specified pregnancy related conditions, unspecified trimester: Secondary | ICD-10-CM | POA: Diagnosis not present

## 2022-01-13 DIAGNOSIS — O26843 Uterine size-date discrepancy, third trimester: Secondary | ICD-10-CM | POA: Diagnosis not present

## 2022-01-13 DIAGNOSIS — O26899 Other specified pregnancy related conditions, unspecified trimester: Secondary | ICD-10-CM | POA: Diagnosis not present

## 2022-01-13 DIAGNOSIS — R519 Headache, unspecified: Secondary | ICD-10-CM | POA: Diagnosis not present

## 2022-01-13 NOTE — H&P (Signed)
[name] is a 31 y.o. female presenting for elective LTCS due to LGA . G1P0  31 y.o. G1P0 at [redacted]w[redacted]d. Patient's last menstrual period was 03/18/2021 (approximate). inconsistent with ultrasound @ [redacted]w[redacted]d. Estimated Date of Delivery: 01/16/22   Sex of baby and name:  "Irving Copas "                              FOB:   [name]    Factors complicating this pregnancy  Mental health Dx: major depressive d/o Followed by: [facility]  Pre-Preg meds:  Bupropion 450 mg daily; sertraline 150 mg; Lunesta Preg changes: 12/06/21 continue buproprion and sertraline  has discontinued Lunesta at NOB.  F/U with PCP for medication management on 12/06/21-12/06/21 continue buproprion 150 mg and sertraline 50 mg Migraine, atypical HA at 39wks Pre-E labs done: hgb 12.1, platelets 223; CMP: pending Hx PCOS, started Metformin per BEB 09/2020- stopped prior to conception.  A1C 5.0, and early GTT:194 with NOB labs 3 HR GTT: [phone] Hx chronic pelvic pain and dyspareunia Heterozygous Prothrombin G20210A mutation:  seen by MFM for consult, needs PP lovenox for 6 weeks.   Screening results and needs: NOB:  Medicaid Questionnaire: n/a []  ACHD Program Depression Score:  MBT: O Positive  Ab screen: negative HIV:neg   RPR: non reactive Hep B: Neg Hep C: non reactive Pap:neg/hpv neg 06/25/21  G/C:neg/neg Rubella: immune  VZV: immune Aneuploidy:  First trimester:  MaternitT21:negative 06/25/21  Second trimester (AFP/tetra): declined 28 weeks:  Review Medicaid Questionnaire:n/a []  ACHD Program Depression Score: 9 Blood consent: faxed 10/24/21 Hgb: 11.8  Platelets:271    Glucola:106   Rhogam: n/a 36 weeks:  GBS: Negative  G/C:Neg/Neg   Hgb:11.8  Platelets:234    HIV:Neg RPR:NR    Last :              05/23/20: Uterus anteverted Single, viable IUP, S=[redacted]w[redacted]d     EDD by u/s=01/16/2022 Yolk sac seen FHR= 107bpm Cervical length=5.56cm Rt ovary appears wnl Left corpus luteum cyst=2.1cm No free fluid  seen              08/29/21: Normal anatomy seen Single, viable IUP, S=[redacted]w[redacted]d FHR=158bpm Cervical length=4.53cm Placenta=anterior Position=vertex B/L ovaries appear WNL Immunization:   . OB History     Gravida  0   Para  0   Term  0   Preterm  0   AB  0   Living  0      SAB  0   IAB  0   Ectopic  0   Multiple  0   Live Births  0          Past Medical History:  Diagnosis Date   Asthma    PCOS (polycystic ovarian syndrome)    UTI (urinary tract infection)    Past Surgical History:  Procedure Laterality Date   BREAST ENHANCEMENT SURGERY  2012   SHOULDER SURGERY     TONSILLECTOMY     Family History: family history includes Arthritis in her mother; Bipolar disorder in her father and paternal aunt; Bleeding Disorder in her father; CAD in her father; Clotting disorder in her paternal aunt and paternal grandmother; Crohn's disease in her brother; Depression in her mother; Diabetes in her father and mother; Drug abuse in her mother; Factor V Leiden deficiency in her mother; Heart disease in her mother; Hypertension in her mother; Kidney failure in her paternal grandfather; Mental illness in  her father and mother; Sleep apnea in her father and mother; Stroke in her mother; Suicidality in her maternal grandfather. Social History:  reports that she has never smoked. She has never used smokeless tobacco. She reports that she does not currently use alcohol. She reports that she does not use drugs.      Review of Systems Review of Systems: A full review of systems was performed and negative except as noted in the HPI.   Eyes: no vision change  Ears: left ear pain  Oropharynx: no sore throat  Pulmonary . No shortness of breath , no hemoptysis Cardiovascular: no chest pain , no irregular heart beat  Gastrointestinal:no blood in stool . No diarrhea, no constipation Uro gynecologic: no dysuria , no pelvic pain Neurologic : no seizure , no migraines    Musculoskeletal: no  muscular weakness  History   There were no vitals taken for this visit. ExamBP 137/87 Physical Exam Lungs CTA  CV  RRR without murmur Adb: gravid  Prenatal labs: ABO, Rh:  O+ Antibody:  neg Rubella:  Imm , VZ Imm RPR:   NR HBsAg:   neg , neg Hep C HIV:   neg GBS:   neg  Assessment/Plan: Elective Primary LTCS . Pt concerned about LGA and dystocia . Pt has been counseled regarding variance in u/s and likelihood for dystocia .  She wishes to proceed with LTCS  The risks of cesarean section discussed with the patient included but were not limited to: bleeding which may require transfusion or reoperation; infection which may require antibiotics; injury to bowel, bladder, ureters or other surrounding organs; injury to the fetus; need for additional procedures including hysterectomy in the event of a life-threatening hemorrhage; placental abnormalities wth subsequent pregnancies, incisional problems, thromboembolic phenomenon and other postoperative/anesthesia complications. The patient concurred with the proposed plan, giving informed written consent for the procedure.       [name] 01/13/2022, 3:34 PM

## 2022-01-15 ENCOUNTER — Other Ambulatory Visit: Payer: Self-pay

## 2022-01-15 ENCOUNTER — Encounter: Payer: Self-pay | Admitting: Obstetrics and Gynecology

## 2022-01-15 ENCOUNTER — Inpatient Hospital Stay
Admission: RE | Admit: 2022-01-15 | Discharge: 2022-01-18 | DRG: 787 | Disposition: A | Discharge status: Home / Self Care | Payer: BC Managed Care – PPO | Source: Ambulatory Visit | Attending: Obstetrics and Gynecology | Admitting: Obstetrics and Gynecology

## 2022-01-15 ENCOUNTER — Inpatient Hospital Stay: Payer: BC Managed Care – PPO | Admitting: Registered Nurse

## 2022-01-15 ENCOUNTER — Encounter
Admission: RE | Disposition: A | Discharge status: Home / Self Care | Payer: Self-pay | Source: Ambulatory Visit | Attending: Obstetrics and Gynecology

## 2022-01-15 DIAGNOSIS — O9081 Anemia of the puerperium: Secondary | ICD-10-CM | POA: Diagnosis not present

## 2022-01-15 DIAGNOSIS — O3663X0 Maternal care for excessive fetal growth, third trimester, not applicable or unspecified: Secondary | ICD-10-CM | POA: Diagnosis present

## 2022-01-15 DIAGNOSIS — D62 Acute posthemorrhagic anemia: Secondary | ICD-10-CM

## 2022-01-15 DIAGNOSIS — O3483 Maternal care for other abnormalities of pelvic organs, third trimester: Secondary | ICD-10-CM | POA: Diagnosis present

## 2022-01-15 DIAGNOSIS — Z3A39 39 weeks gestation of pregnancy: Secondary | ICD-10-CM | POA: Diagnosis not present

## 2022-01-15 DIAGNOSIS — O34211 Maternal care for low transverse scar from previous cesarean delivery: Secondary | ICD-10-CM | POA: Diagnosis not present

## 2022-01-15 DIAGNOSIS — Z3A00 Weeks of gestation of pregnancy not specified: Secondary | ICD-10-CM | POA: Diagnosis not present

## 2022-01-15 DIAGNOSIS — Z23 Encounter for immunization: Secondary | ICD-10-CM | POA: Diagnosis not present

## 2022-01-15 DIAGNOSIS — F419 Anxiety disorder, unspecified: Secondary | ICD-10-CM | POA: Diagnosis present

## 2022-01-15 DIAGNOSIS — F32A Depression, unspecified: Secondary | ICD-10-CM | POA: Diagnosis present

## 2022-01-15 DIAGNOSIS — O99344 Other mental disorders complicating childbirth: Secondary | ICD-10-CM | POA: Diagnosis present

## 2022-01-15 LAB — CBC
HCT: 36.8 % (ref 36.0–46.0)
Hemoglobin: 12.2 g/dL (ref 12.0–15.0)
MCH: 32.4 pg (ref 26.0–34.0)
MCHC: 33.2 g/dL (ref 30.0–36.0)
MCV: 97.6 fL (ref 80.0–100.0)
Platelets: 217 K/uL (ref 150–400)
RBC: 3.77 MIL/uL — ABNORMAL LOW (ref 3.87–5.11)
RDW: 13.7 % (ref 11.5–15.5)
WBC: 11.2 K/uL — ABNORMAL HIGH (ref 4.0–10.5)
nRBC: 0 % (ref 0.0–0.2)

## 2022-01-15 LAB — TYPE AND SCREEN
ABO/RH(D): O POS
Antibody Screen: NEGATIVE

## 2022-01-15 LAB — BASIC METABOLIC PANEL
Anion gap: 10 (ref 5–15)
BUN: 7 mg/dL (ref 6–20)
CO2: 19 mmol/L — ABNORMAL LOW (ref 22–32)
Calcium: 9 mg/dL (ref 8.9–10.3)
Chloride: 108 mmol/L (ref 98–111)
Creatinine, Ser: 0.63 mg/dL (ref 0.44–1.00)
GFR, Estimated: >60 mL/min (ref >60)
Glucose, Bld: 99 mg/dL (ref 70–99)
Potassium: 3.6 mmol/L (ref 3.5–5.1)
Sodium: 137 mmol/L (ref 135–145)

## 2022-01-15 LAB — ABO/RH: ABO/RH(D): O POS

## 2022-01-15 SURGERY — Surgical Case
Anesthesia: Spinal

## 2022-01-15 MED ORDER — ONDANSETRON HCL 4 MG/2ML IJ SOLN
INTRAMUSCULAR | Status: AC
  Filled 2022-01-15: qty 2

## 2022-01-15 MED ORDER — OXYCODONE HCL 5 MG PO TABS: 5.0000 mg | ORAL_TABLET | Freq: Four times a day (QID) | ORAL | Status: DC | PRN

## 2022-01-15 MED ORDER — OXYTOCIN-SODIUM CHLORIDE 30-0.9 UT/500ML-% IV SOLN
2.5000 [IU]/h | INTRAVENOUS | Status: AC
  Administered 2022-01-15: 2.5 [IU]/h via INTRAVENOUS

## 2022-01-15 MED ORDER — NALOXONE HCL 0.4 MG/ML IJ SOLN: 0.4000 mg | INTRAMUSCULAR | Status: DC | PRN

## 2022-01-15 MED ORDER — SOD CITRATE-CITRIC ACID 500-334 MG/5ML PO SOLN
30.0000 mL | ORAL | Status: AC
  Administered 2022-01-15: 30 mL via ORAL

## 2022-01-15 MED ORDER — KETOROLAC TROMETHAMINE 30 MG/ML IJ SOLN
30.0000 mg | Freq: Four times a day (QID) | INTRAMUSCULAR | Status: AC
  Administered 2022-01-15 – 2022-01-16 (×4): 30 mg via INTRAVENOUS
  Filled 2022-01-15 (×4): qty 1

## 2022-01-15 MED ORDER — SIMETHICONE 80 MG PO CHEW
80.0000 mg | CHEWABLE_TABLET | ORAL | Status: DC | PRN
  Administered 2022-01-16 – 2022-01-17 (×2): 80 mg via ORAL
  Filled 2022-01-15 (×2): qty 1

## 2022-01-15 MED ORDER — KETOROLAC TROMETHAMINE 30 MG/ML IJ SOLN
INTRAMUSCULAR | Status: DC | PRN
  Administered 2022-01-15: 30 mg via INTRAVENOUS

## 2022-01-15 MED ORDER — LIDOCAINE HCL (PF) 1 % IJ SOLN
INTRAMUSCULAR | Status: DC | PRN
  Administered 2022-01-15: 3 mL via SUBCUTANEOUS

## 2022-01-15 MED ORDER — SOD CITRATE-CITRIC ACID 500-334 MG/5ML PO SOLN
ORAL | Status: AC
  Filled 2022-01-15: qty 15

## 2022-01-15 MED ORDER — MENTHOL 3 MG MT LOZG: 1.0000 | LOZENGE | OROMUCOSAL | Status: DC | PRN

## 2022-01-15 MED ORDER — BUPIVACAINE HCL (PF) 0.5 % IJ SOLN
INTRAMUSCULAR | Status: AC
  Filled 2022-01-15: qty 60

## 2022-01-15 MED ORDER — LACTATED RINGERS IV SOLN: INTRAVENOUS | Status: DC

## 2022-01-15 MED ORDER — IBUPROFEN 600 MG PO TABS
600.0000 mg | ORAL_TABLET | Freq: Four times a day (QID) | ORAL | Status: DC
  Administered 2022-01-16 – 2022-01-18 (×8): 600 mg via ORAL
  Filled 2022-01-15 (×8): qty 1

## 2022-01-15 MED ORDER — ENOXAPARIN SODIUM 60 MG/0.6ML IJ SOSY
0.5000 mg/kg | PREFILLED_SYRINGE | INTRAMUSCULAR | Status: DC
  Administered 2022-01-16 – 2022-01-18 (×3): 45 mg via SUBCUTANEOUS
  Filled 2022-01-15 (×3): qty 0.6

## 2022-01-15 MED ORDER — OXYTOCIN-SODIUM CHLORIDE 30-0.9 UT/500ML-% IV SOLN
INTRAVENOUS | Status: AC
  Filled 2022-01-15: qty 500

## 2022-01-15 MED ORDER — MEPERIDINE HCL 25 MG/ML IJ SOLN: 6.2500 mg | INTRAMUSCULAR | Status: DC | PRN

## 2022-01-15 MED ORDER — COCONUT OIL OIL: 1.0000 | TOPICAL_OIL | Status: DC | PRN

## 2022-01-15 MED ORDER — BUPIVACAINE HCL (PF) 0.5 % IJ SOLN
INTRAMUSCULAR | Status: DC | PRN
  Administered 2022-01-15: 60 mL

## 2022-01-15 MED ORDER — FENTANYL CITRATE (PF) 100 MCG/2ML IJ SOLN
INTRAMUSCULAR | Status: DC | PRN
  Administered 2022-01-15: 15 ug via INTRATHECAL

## 2022-01-15 MED ORDER — SIMETHICONE 80 MG PO CHEW
80.0000 mg | CHEWABLE_TABLET | Freq: Three times a day (TID) | ORAL | Status: DC
  Administered 2022-01-15 – 2022-01-18 (×8): 80 mg via ORAL
  Filled 2022-01-15 (×8): qty 1

## 2022-01-15 MED ORDER — GABAPENTIN 300 MG PO CAPS
300.0000 mg | ORAL_CAPSULE | Freq: Once | ORAL | Status: AC
  Administered 2022-01-15: 300 mg via ORAL
  Filled 2022-01-15: qty 1

## 2022-01-15 MED ORDER — DIPHENHYDRAMINE HCL 25 MG PO CAPS: 25.0000 mg | ORAL_CAPSULE | ORAL | Status: DC | PRN

## 2022-01-15 MED ORDER — PHENYLEPHRINE HCL-NACL 20-0.9 MG/250ML-% IV SOLN
INTRAVENOUS | Status: DC | PRN
  Administered 2022-01-15: 50 ug/min via INTRAVENOUS

## 2022-01-15 MED ORDER — DEXAMETHASONE SODIUM PHOSPHATE 10 MG/ML IJ SOLN
INTRAMUSCULAR | Status: DC | PRN
  Administered 2022-01-15: 10 mg via INTRAVENOUS

## 2022-01-15 MED ORDER — CHLORHEXIDINE GLUCONATE 0.12 % MT SOLN
15.0000 mL | Freq: Once | OROMUCOSAL | Status: AC
  Administered 2022-01-15: 15 mL via OROMUCOSAL

## 2022-01-15 MED ORDER — OXYTOCIN-SODIUM CHLORIDE 30-0.9 UT/500ML-% IV SOLN
INTRAVENOUS | Status: DC | PRN
  Administered 2022-01-15: 250 mL/h via INTRAVENOUS

## 2022-01-15 MED ORDER — SODIUM CHLORIDE FLUSH 0.9 % IV SOLN
INTRAVENOUS | Status: AC
  Filled 2022-01-15: qty 10

## 2022-01-15 MED ORDER — PHENYLEPHRINE 80 MCG/ML (10ML) SYRINGE FOR IV PUSH (FOR BLOOD PRESSURE SUPPORT)
PREFILLED_SYRINGE | INTRAVENOUS | Status: DC | PRN
  Administered 2022-01-15 (×3): 160 ug via INTRAVENOUS

## 2022-01-15 MED ORDER — WITCH HAZEL-GLYCERIN EX PADS: 1.0000 | MEDICATED_PAD | CUTANEOUS | Status: DC | PRN

## 2022-01-15 MED ORDER — EPHEDRINE SULFATE-NACL 50-0.9 MG/10ML-% IV SOSY
PREFILLED_SYRINGE | INTRAVENOUS | Status: DC | PRN
  Administered 2022-01-15 (×2): 5 mg via INTRAVENOUS

## 2022-01-15 MED ORDER — IBUPROFEN 100 MG/5ML PO SUSP: 200.0000 mg | Freq: Four times a day (QID) | ORAL | Status: DC | PRN

## 2022-01-15 MED ORDER — PHENYLEPHRINE HCL-NACL 20-0.9 MG/250ML-% IV SOLN
INTRAVENOUS | Status: AC
  Filled 2022-01-15: qty 250

## 2022-01-15 MED ORDER — BUPIVACAINE IN DEXTROSE 0.75-8.25 % IT SOLN
INTRATHECAL | Status: DC | PRN
  Administered 2022-01-15: 1.6 mL via INTRATHECAL

## 2022-01-15 MED ORDER — DIBUCAINE (PERIANAL) 1 % EX OINT: 1.0000 | TOPICAL_OINTMENT | CUTANEOUS | Status: DC | PRN

## 2022-01-15 MED ORDER — SENNOSIDES-DOCUSATE SODIUM 8.6-50 MG PO TABS
2.0000 | ORAL_TABLET | Freq: Every day | ORAL | Status: DC
  Administered 2022-01-16 – 2022-01-18 (×3): 2 via ORAL
  Filled 2022-01-15 (×3): qty 2

## 2022-01-15 MED ORDER — CHLORHEXIDINE GLUCONATE 0.12 % MT SOLN
OROMUCOSAL | Status: AC
  Filled 2022-01-15: qty 15

## 2022-01-15 MED ORDER — ACETAMINOPHEN 500 MG PO TABS
1000.0000 mg | ORAL_TABLET | Freq: Once | ORAL | Status: DC
  Administered 2022-01-15: 1000 mg via ORAL
  Filled 2022-01-15: qty 2

## 2022-01-15 MED ORDER — ONDANSETRON HCL 4 MG/2ML IJ SOLN
INTRAMUSCULAR | Status: DC | PRN
  Administered 2022-01-15: 4 mg via INTRAVENOUS

## 2022-01-15 MED ORDER — TETANUS-DIPHTH-ACELL PERTUSSIS 5-2.5-18.5 LF-MCG/0.5 IM SUSY: 0.5000 mL | PREFILLED_SYRINGE | Freq: Once | INTRAMUSCULAR | Status: DC

## 2022-01-15 MED ORDER — ZOLPIDEM TARTRATE 5 MG PO TABS: 5.0000 mg | ORAL_TABLET | Freq: Every evening | ORAL | Status: DC | PRN

## 2022-01-15 MED ORDER — MORPHINE SULFATE (PF) 0.5 MG/ML IJ SOLN
INTRAMUSCULAR | Status: AC
  Filled 2022-01-15: qty 10

## 2022-01-15 MED ORDER — OXYCODONE HCL 5 MG PO TABS
5.0000 mg | ORAL_TABLET | ORAL | Status: DC | PRN
  Administered 2022-01-15 – 2022-01-16 (×2): 10 mg via ORAL
  Administered 2022-01-16 (×3): 5 mg via ORAL
  Administered 2022-01-17 – 2022-01-18 (×8): 10 mg via ORAL
  Filled 2022-01-15 (×9): qty 2
  Filled 2022-01-15: qty 1
  Filled 2022-01-15 (×3): qty 2

## 2022-01-15 MED ORDER — ACETAMINOPHEN 500 MG PO TABS: 1000.0000 mg | ORAL_TABLET | Freq: Four times a day (QID) | ORAL | Status: DC

## 2022-01-15 MED ORDER — METHYLERGONOVINE MALEATE 0.2 MG/ML IJ SOLN
INTRAMUSCULAR | Status: AC
  Filled 2022-01-15: qty 1

## 2022-01-15 MED ORDER — CEFAZOLIN SODIUM-DEXTROSE 2-4 GM/100ML-% IV SOLN
2.0000 g | INTRAVENOUS | Status: AC
  Administered 2022-01-15: 2 g via INTRAVENOUS
  Filled 2022-01-15: qty 100

## 2022-01-15 MED ORDER — GABAPENTIN 300 MG PO CAPS
300.0000 mg | ORAL_CAPSULE | Freq: Every day | ORAL | Status: DC
  Administered 2022-01-15 – 2022-01-17 (×3): 300 mg via ORAL
  Filled 2022-01-15 (×3): qty 1

## 2022-01-15 MED ORDER — ONDANSETRON HCL 4 MG/2ML IJ SOLN: 4.0000 mg | Freq: Three times a day (TID) | INTRAMUSCULAR | Status: DC | PRN

## 2022-01-15 MED ORDER — FENTANYL CITRATE (PF) 100 MCG/2ML IJ SOLN
INTRAMUSCULAR | Status: AC
  Filled 2022-01-15: qty 2

## 2022-01-15 MED ORDER — NALOXONE HCL 4 MG/10ML IJ SOLN: 1.0000 ug/kg/h | INTRAVENOUS | Status: DC | PRN

## 2022-01-15 MED ORDER — IBUPROFEN 200 MG PO TABS: 200.0000 mg | ORAL_TABLET | Freq: Four times a day (QID) | ORAL | Status: DC | PRN

## 2022-01-15 MED ORDER — SODIUM CHLORIDE 0.9% FLUSH
INTRAVENOUS | Status: DC | PRN
  Administered 2022-01-15: 20 mL via INTRAVENOUS

## 2022-01-15 MED ORDER — MORPHINE SULFATE (PF) 2 MG/ML IV SOLN: 1.0000 mg | INTRAVENOUS | Status: DC | PRN

## 2022-01-15 MED ORDER — PRENATAL MULTIVITAMIN CH
1.0000 | ORAL_TABLET | Freq: Every day | ORAL | Status: DC
  Administered 2022-01-16 – 2022-01-17 (×2): 1 via ORAL
  Filled 2022-01-15 (×2): qty 1

## 2022-01-15 MED ORDER — DIPHENHYDRAMINE HCL 25 MG PO CAPS: 25.0000 mg | ORAL_CAPSULE | Freq: Four times a day (QID) | ORAL | Status: DC | PRN

## 2022-01-15 MED ORDER — 0.9 % SODIUM CHLORIDE (POUR BTL) OPTIME
TOPICAL | Status: DC | PRN
  Administered 2022-01-15: 1000 mL

## 2022-01-15 MED ORDER — DIPHENHYDRAMINE HCL 50 MG/ML IJ SOLN: 12.5000 mg | INTRAMUSCULAR | Status: DC | PRN

## 2022-01-15 MED ORDER — SCOPOLAMINE 1 MG/3DAYS TD PT72: 1.0000 | MEDICATED_PATCH | Freq: Once | TRANSDERMAL | Status: DC

## 2022-01-15 MED ORDER — ACETAMINOPHEN 500 MG PO TABS
1000.0000 mg | ORAL_TABLET | Freq: Four times a day (QID) | ORAL | Status: DC
  Administered 2022-01-15 – 2022-01-18 (×12): 1000 mg via ORAL
  Filled 2022-01-15 (×12): qty 2

## 2022-01-15 MED ORDER — SODIUM CHLORIDE 0.9% FLUSH: 3.0000 mL | INTRAVENOUS | Status: DC | PRN

## 2022-01-15 MED ORDER — KETOROLAC TROMETHAMINE 30 MG/ML IJ SOLN: 30.0000 mg | Freq: Once | INTRAMUSCULAR | Status: DC

## 2022-01-15 MED ORDER — MORPHINE SULFATE (PF) 0.5 MG/ML IJ SOLN
INTRAMUSCULAR | Status: DC | PRN
  Administered 2022-01-15: .1 mg via EPIDURAL

## 2022-01-15 SURGICAL SUPPLY — 34 items
APL PRP STRL LF DISP 70% ISPRP (MISCELLANEOUS) ×1
BARRIER ADHS 3X4 INTERCEED (GAUZE/BANDAGES/DRESSINGS) ×2 IMPLANT
BRR ADH 4X3 ABS CNTRL BYND (GAUZE/BANDAGES/DRESSINGS) ×1
CHLORAPREP W/TINT 26 (MISCELLANEOUS) ×2 IMPLANT
DRSG PAD ABDOMINAL 8X10 ST (GAUZE/BANDAGES/DRESSINGS) IMPLANT
DRSG TELFA 3X8 NADH STRL (GAUZE/BANDAGES/DRESSINGS) ×2 IMPLANT
ELECT CAUTERY BLADE 6.4 (BLADE) ×2 IMPLANT
ELECT REM PT RETURN 9FT ADLT (ELECTROSURGICAL) ×1
ELECTRODE REM PT RTRN 9FT ADLT (ELECTROSURGICAL) ×2 IMPLANT
GAUZE SPONGE 4X4 12PLY STRL (GAUZE/BANDAGES/DRESSINGS) ×2 IMPLANT
GLOVE SURG SYN 8.0 (GLOVE) ×1 IMPLANT
GLOVE SURG SYN 8.0 PF PI (GLOVE) ×2 IMPLANT
GOWN STRL REUS W/ TWL LRG LVL3 (GOWN DISPOSABLE) ×4 IMPLANT
GOWN STRL REUS W/ TWL XL LVL3 (GOWN DISPOSABLE) ×2 IMPLANT
GOWN STRL REUS W/TWL LRG LVL3 (GOWN DISPOSABLE) ×2
GOWN STRL REUS W/TWL XL LVL3 (GOWN DISPOSABLE) ×1
MANIFOLD NEPTUNE II (INSTRUMENTS) ×2 IMPLANT
MAT PREVALON FULL STRYKER (MISCELLANEOUS) ×2 IMPLANT
NEEDLE HYPO 22GX1.5 SAFETY (NEEDLE) ×2 IMPLANT
NS IRRIG 1000ML POUR BTL (IV SOLUTION) ×2 IMPLANT
PACK C SECTION AR (MISCELLANEOUS) ×2 IMPLANT
PAD OB MATERNITY 4.3X12.25 (PERSONAL CARE ITEMS) ×2 IMPLANT
PAD PREP 24X41 OB/GYN DISP (PERSONAL CARE ITEMS) ×2 IMPLANT
SCRUB CHG 4% DYNA-HEX 4OZ (MISCELLANEOUS) ×2 IMPLANT
STRAP SAFETY 5IN WIDE (MISCELLANEOUS) ×2 IMPLANT
SUCT VACUUM KIWI BELL (SUCTIONS) IMPLANT
SUT CHROMIC 1 CTX 36 (SUTURE) ×6 IMPLANT
SUT PLAIN GUT 0 (SUTURE) ×4 IMPLANT
SUT VIC AB 0 CT1 36 (SUTURE) ×4 IMPLANT
SYR 30ML LL (SYRINGE) ×4 IMPLANT
TAPE SURG TRANSPORE 1 IN (GAUZE/BANDAGES/DRESSINGS) IMPLANT
TAPE SURGICAL TRANSPORE 1 IN (GAUZE/BANDAGES/DRESSINGS) ×1
TRAP FLUID SMOKE EVACUATOR (MISCELLANEOUS) ×2 IMPLANT
WATER STERILE IRR 500ML POUR (IV SOLUTION) ×2 IMPLANT

## 2022-01-15 NOTE — Lactation Note (Signed)
This note was copied from a baby's chart. Lactation Consultation Note  Patient Name: [name] Today's Date: 01/15/2022 Reason for consult: L&D Initial assessment;Primapara;1st time breastfeeding;Term;Other (Comment) (LGA, elective c-section) Age:33 hours  Maternal Data Has patient been taught Hand Expression?: Yes Does the patient have breastfeeding experience prior to this delivery?: No  First time mom delivered via elective c-section to [place] baby.  Mom desires to breastfeed.  Feeding Mother's Current Feeding Choice: Breast Milk and Formula  LC at bedside for first feeding attempt and support.  LATCH Score Latch: Repeated attempts needed to sustain latch, nipple held in mouth throughout feeding, stimulation needed to elicit sucking reflex.  Audible Swallowing: A few with stimulation  Type of Nipple: Everted at rest and after stimulation  Comfort (Breast/Nipple): Soft / non-tender  Hold (Positioning): Assistance needed to correctly position infant at breast and maintain latch.  LATCH Score: 7  Baby transitioned into football hold on L breast. Mom has well everted nipples and compressible breast tissue. After a couple of attempts baby latched and sustained latch at the breast for 12+ minutes. Occasional swallow heard, breast massage/compression used throughout the feeding to help maximize transfer.  Lactation Tools Discussed/Used    Interventions Interventions: Breast feeding basics reviewed;Assisted with latch;Hand express;Adjust position;Support pillows;Position options;Education  Reviewed position/latch, early cues, feeding on demand, breast massage and compression benefits while feeding, tips for keeping baby awake, and how to tell that baby is latched well and getting enough.  Discharge    Consult Status Consult Status: Follow-up from L&D    Lavonia Drafts 01/15/2022, 10:38 AM

## 2022-01-15 NOTE — Progress Notes (Signed)
Pt is ready for primary LTCS . Labs reviewed . All questions answered . Proceed

## 2022-01-15 NOTE — Discharge Summary (Cosign Needed Addendum)
Obstetrical Discharge Summary  Patient Name: [name] DOB: 03-03-1991 MRN: [id number]  Date of Admission: 01/15/2022 Date of Delivery: 01/15/2022 Delivered by: Huel Cote MD Date of Discharge:01/18/2022   Primary OB: [facility]   LMP:No LMP recorded (lmp unknown). EDC Estimated Date of Delivery: 01/16/22 Gestational Age at Delivery: [redacted]w[redacted]d   Antepartum complications: pelvic floor dysfunction Admitting Diagnosis: infant LGA Secondary Diagnosis: Patient Active Problem List   Diagnosis Date Noted   Acute blood loss anemia 01/18/2022   Delivery by elective cesarean section 01/15/2022   Pelvic pain 03/26/2020   Elevated cholesterol 11/21/2019   Prothrombin G20210A mutation ([place]) 07/05/2019   Anxiety and depression 12/30/2017   PCOS (polycystic ovarian syndrome) 12/30/2017   Insomnia 12/30/2017   Rash 09/18/2016   History of UTI 06/17/2016   Heel pain 01/15/2016    Augmentation: N/A Complications: None Intrapartum complications/course:  Date of Delivery: 01/18/2022  Delivered By: Huel Cote MD Delivery Type: primary cesarean section, low transverse incision Anesthesia: epidural Placenta: spontaneous Laceration:  Episiotomy: none Newborn Data: Live born female Birth Weight:  4210 gm APGAR: , 8/9  Newborn Delivery   Birth date/time:  Delivery type:         Postpartum Procedures: Venofer  Edinburgh:     01/15/2022    7:45 PM  Edinburgh Postnatal Depression Scale Screening Tool  I have been able to laugh and see the funny side of things. 0  I have looked forward with enjoyment to things. 0  I have blamed myself unnecessarily when things went wrong. 1  I have been anxious or worried for no good reason. 1  I have felt scared or panicky for no good reason. 2  Things have been getting on top of me. 2  I have been so unhappy that I have had difficulty sleeping. 1  I have felt sad or miserable. 1  I have been so unhappy that I have been crying. 1   The thought of harming myself has occurred to me. 0  Edinburgh Postnatal Depression Scale Total 9    Post partum course:  Patient had an uncomplicated postpartum course.  By time of discharge on POD#3, her pain was controlled on oral pain medications; she had appropriate lochia and was ambulating, voiding without difficulty, tolerating regular diet and passing flatus.   She was deemed stable for discharge to home.    Discharge Physical Exam:  BP 115/75 (BP Location: Left Arm)   Pulse 67   Temp 97.8 F (36.6 C)   Resp 20   Ht 5\' 7"  (1.702 m)   Wt 90.7 kg   LMP  (LMP Unknown)   SpO2 100%   Breastfeeding Unknown   BMI 31.32 kg/m   General: NAD CV: RRR Pulm: CTABL, nl effort ABD: s/nd/nt, fundus firm and below the umbilicus Lochia: moderate Incision: c/d/i DVT Evaluation: LE non-ttp, no evidence of DVT on exam.  Hemoglobin  Date Value Ref Range Status  01/16/2022 8.8 (L) 12.0 - 15.0 g/dL Final    Comment:    REPEATED TO VERIFY   HCT  Date Value Ref Range Status  01/16/2022 26.7 (L) 36.0 - 46.0 % Final   Risk assessment for postpartum VTE and prophylactic treatment: Very high risk factors: None High risk factors: None Moderate risk factors: Cesarean delivery  and BMI 30-40 kg/m2  Postpartum VTE prophylaxis with LMWH not indicated   Disposition: stable, discharge to home. Baby Feeding: breastmilk Baby Disposition: home with mom  Rh Immune globulin given: O  pos Rubella vaccine given: Immune Tdap vaccine given in AP or PP setting: Declined in AP setting Flu vaccine given in AP or PP setting: to be offered PP  Contraception: TBD  Prenatal Labs:   ABO, Rh:  O+ Antibody:  neg Rubella:  Imm , VZ Imm RPR:   NR HBsAg:   neg , neg Hep C HIV:   neg GBS:   neg  Plan:  Ulysees Barns was discharged to home in good condition. Follow-up appointment with delivering provider in 6 weeks.  Discharge Medications: Allergies as of 01/18/2022       Reactions   Aspirin  Anaphylaxis   Levaquin [levofloxacin In D5w] Hives   Sulfa Antibiotics Other (See Comments)        Medication List     TAKE these medications    acetaminophen 500 MG tablet Commonly known as: TYLENOL Take 1 tablet (500 mg total) by mouth every 6 (six) hours as needed.   buPROPion 150 MG 24 hr tablet Commonly known as: WELLBUTRIN XL Take 1 tablet (150 mg total) by mouth every morning.   enoxaparin 60 MG/0.6ML injection Commonly known as: LOVENOX Inject 0.45 mLs (45 mg total) into the skin daily. Start taking on: January 19, 2022   Eszopiclone 3 MG Tabs Take 1 tablet (3 mg total) by mouth at bedtime. Take immediately before bedtime   ferrous sulfate 325 (65 FE) MG tablet Take 1 tablet (325 mg total) by mouth 2 (two) times daily with a meal.   ibuprofen 600 MG tablet Commonly known as: ADVIL Take 1 tablet (600 mg total) by mouth every 6 (six) hours as needed.   oxyCODONE 5 MG immediate release tablet Commonly known as: Oxy IR/ROXICODONE Take 1-2 tablets (5-10 mg total) by mouth every 6 (six) hours as needed for moderate pain.   sertraline 50 MG tablet Commonly known as: ZOLOFT Take 1 tablet (50 mg total) by mouth at bedtime.         Follow-up Information     Schermerhorn, Ihor Austin, MD. Schedule an appointment as soon as possible for a visit in 2 week(s).   Specialty: Obstetrics and Gynecology Why: for postop appointment Contact information: [address] [place] [address] [phone]                 Signed: Cyril Mourning 01/18/2022 12:43 PM

## 2022-01-15 NOTE — Anesthesia Procedure Notes (Signed)
Spinal  Patient location during procedure: OR Start time: 01/15/2022 8:35 AM End time: 01/15/2022 8:42 AM Reason for block: surgical anesthesia Staffing Performed: resident/CRNA and other anesthesia staff  Anesthesiologist: Iran Ouch, MD Resident/CRNA: Tollie Eth, CRNA Other anesthesia staff: Everardo Pacific, RN Performed by: Tollie Eth, CRNA Authorized by: Tollie Eth, CRNA   Preanesthetic Checklist Completed: patient identified, IV checked, site marked, risks and benefits discussed, surgical consent, monitors and equipment checked, pre-op evaluation and timeout performed Spinal Block Patient position: sitting Prep: ChloraPrep Patient monitoring: heart rate, continuous pulse ox, blood pressure and cardiac monitor Approach: midline Location: L3-4 Injection technique: single-shot Needle Needle type: Whitacre and Introducer  Needle gauge: 24 G Needle length: 9 cm Assessment Sensory level: T4 Events: CSF return Additional Notes Negative paresthesia. Negative blood return. Positive free-flowing CSF. Expiration date of kit checked and confirmed. Patient tolerated procedure well, without complications. Placed by Laurence Spates.

## 2022-01-15 NOTE — Brief Op Note (Signed)
01/15/2022  9:28 AM  PATIENT:  [name]  31 y.o. female  PRE-OPERATIVE DIAGNOSIS:  elective  cesarean ,LGA  POST-OPERATIVE DIAGNOSIS:  same as above  PROCEDURE:  Procedure(s): CESAREAN SECTION (N/A) LTCS  SURGEON:  Surgeon(s) and Role:    * Zuzanna Maroney, Gwen Her, MD - Primary  PHYSICIAN ASSISTANT: Avelino Leeds, cnm  ASSISTANTS: PA student   ANESTHESIA:   spinal  EBL:  QBL : 450cc IOF 1200 cc uo 50 cc   BLOOD ADMINISTERED:none  DRAINS: Urinary Catheter (Foley)   LOCAL MEDICATIONS USED:  MARCAINE     SPECIMEN:  No Specimen  DISPOSITION OF SPECIMEN:  N/A  COUNTS:  YES  TOURNIQUET:  * No tourniquets in log *  DICTATION: .Other Dictation: Dictation Number verbal  PLAN OF CARE: Admit to inpatient   PATIENT DISPOSITION:  PACU - hemodynamically stable.   Delay start of Pharmacological VTE agent (>24hrs) due to surgical blood loss or risk of bleeding: not applicable

## 2022-01-15 NOTE — Anesthesia Preprocedure Evaluation (Addendum)
Anesthesia Evaluation  Patient identified by MRN, date of birth, ID band Patient awake    Reviewed: Allergy & Precautions, NPO status , Patient's Chart, lab work & pertinent test results  Airway Mallampati: III  TM Distance: >3 FB Neck ROM: full    Dental no notable dental hx.    Pulmonary asthma (childhood) ,    Pulmonary exam normal        Cardiovascular Exercise Tolerance: Good negative cardio ROS Normal cardiovascular exam     Neuro/Psych PSYCHIATRIC DISORDERS Anxiety    GI/Hepatic negative GI ROS,   Endo/Other    Renal/GU   negative genitourinary   Musculoskeletal   Abdominal   Peds  Hematology Prothrombin G20210A mutation    Anesthesia Other Findings Past Medical History: No date: Asthma No date: PCOS (polycystic ovarian syndrome) No date: UTI (urinary tract infection)  Past Surgical History: 2012: BREAST ENHANCEMENT SURGERY No date: SHOULDER SURGERY No date: TONSILLECTOMY  BMI    Body Mass Index: 31.32 kg/m      Reproductive/Obstetrics (+) Pregnancy                            Anesthesia Physical Anesthesia Plan  ASA: 2  Anesthesia Plan: Spinal   Post-op Pain Management: Regional block*, Tylenol PO (pre-op)* and Gabapentin PO (pre-op)*   Induction: Intravenous  PONV Risk Score and Plan:   Airway Management Planned: Natural Airway  Additional Equipment:   Intra-op Plan:   Post-operative Plan:   Informed Consent: I have reviewed the patients History and Physical, chart, labs and discussed the procedure including the risks, benefits and alternatives for the proposed anesthesia with the patient or authorized representative who has indicated his/her understanding and acceptance.     Dental Advisory Given  Plan Discussed with: Anesthesiologist  Anesthesia Plan Comments:        Anesthesia Quick Evaluation

## 2022-01-15 NOTE — Transfer of Care (Signed)
Immediate Anesthesia Transfer of Care Note  Patient: [name]  Procedure(s) Performed: CESAREAN SECTION  Patient Location: Mother/Baby  Anesthesia Type:Spinal  Level of Consciousness: awake, alert  and oriented  Airway & Oxygen Therapy: Patient Spontanous Breathing  Post-op Assessment: Report given to RN and Post -op Vital signs reviewed and stable  Post vital signs: Reviewed and stable  Last Vitals:  Vitals Value Taken Time  BP 94/71   Temp    Pulse 86 01/15/22 0942  Resp 23 01/15/22 0942  SpO2 97 % 01/15/22 0942  Vitals shown include unvalidated device data.  Last Pain:  Vitals:   01/15/22 0554  TempSrc: Oral  PainSc: 0-No pain         Complications: No notable events documented.

## 2022-01-15 NOTE — Op Note (Signed)
NAMEKELSE, [name] MEDICAL RECORD NO: [id number] ACCOUNT NO: [id number] DATE OF BIRTH: 10-28-1990 FACILITY: [facility] LOCATION: [facility] PHYSICIAN: Boykin Nearing, MD  Operative Report   PREOPERATIVE DIAGNOSES: 1.  39+6 weeks' estimated gestational age. 2.  Infant large for gestational age. 3.  Pelvic floor dysfunction. 4.  Elective primary cesarean section.  POSTOPERATIVE DIAGNOSES: 1.  39+6 weeks' estimated gestational age. 2.  Infant large for gestational age. 3.  Pelvic floor dysfunction. 4.  Elective primary cesarean section. 5.  Vigorous female, delivered.  PROCEDURE:  Primary low transverse cesarean section.  SURGEON:  Boykin Nearing, MD  FIRST ASSISTANT: Avelino Leeds, certified nurse midwife.  SECOND ASSISTANT:  PA student.  ANESTHESIA:  Spinal.  INDICATIONS:  A 31 year old gravida 1, para 0 patient at 39+6 weeks' estimated gestational age with an ultrasound 2 days prior to the surgery.  Estimated fetal weight to be 4200 grams.  The patient has a history of pelvic floor dysfunction, has undergone  pelvic floor physical therapy and she elects for primary cesarean section.  The patient has been counseled for the pros and cons of elective cesarean section versus trial of labor.  DESCRIPTION OF PROCEDURE:  After adequate spinal anesthesia, the patient was placed in dorsal supine position, hip roll on the right side.  The patient did receive 2 grams of IV Ancef prior to commencement of the case.  The patient's abdomen was prepped  and draped in normal sterile fashion.  Timeout was performed.  A Pfannenstiel incision was made 2 fingerbreadths above the symphysis pubis.  Sharp dissection was used to identify the fascia.  Fascia was opened in the midline and opened in a transverse  fashion. The superior aspect of the fascia was grasped with Kocher clamps and the recti muscles were dissected free.  Inferior aspect of the fascia was grasped with Kocher clamps and  pyramidalis muscles dissected free.  Peritoneal cavity was accomplished  sharply.  Vesicouterine peritoneal fold was identified and the bladder was reflected inferiorly.  A low transverse uterine incision was made.  Upon entry into the endometrial cavity, clear fluid resulted.  The uterine incision was extended with blunt  transverse traction.  Fetal head was brought to the incision.  A Kiwi bell vacuum was applied to the occiput and with 1 pull, the head was delivered and the vacuum was removed.  Large fetal head was then brought up through the incision and a loose nuchal  cord was reduced.  The shoulders and the body were delivered through the incision without difficulty.  Large vigorous female was then dried on the mother's abdomen for 60 seconds and cord was doubly clamped and infant was passed to nursery staff who  assigned Apgar scores of 8 and 9, fetal weight 4210 grams. The placenta was then manually delivered and the uterus was exteriorized.  Endometrial cavity was wiped clean with laparotomy tape and the uterine incision was closed with 1 chromic suture in a  running locking fashion, good approximation of edges.  Two additional figure-of-eight sutures were required for hemostasis.  Fallopian tubes and ovaries appeared normal.  Posterior cul-de-sac was irrigated and suctioned and the uterus was placed back  into the abdominal cavity.  The pericolic gutters were wiped clean with laparotomy tape.  Uterine incision again appeared hemostatic and Interceed was placed over the uterine incision in a T-shaped fashion.  Fascia was then closed with 0 Vicryl suture in  a running nonlocking fashion.  Good approximation of edges.  The fascial edges  were then injected with a solution of 60 mL of 0.5% Marcaine plus 20 mL normal saline.  Approximately 50 mL of solution was injected.  Subcutaneous tissues were irrigated and  bovied for hemostasis and the skin was reapproximated with staples.  Good cosmetic effect.   Additional 30 mL of Marcaine was injected beneath the skin.  There were no complications.  QUANTITATIVE BLOOD LOSS:  450 mL.  INTRAOPERATIVE FLUIDS:  1200 mL.  URINE OUTPUT:  50 mL.  The patient received 30 mg Toradol at the end of the procedure.  DISPOSITION:  She was taken to recovery room in good condition.   NIK D: 01/15/2022 9:42:27 am T: 01/15/2022 10:14:00 am  JOB: [id number]/ [id number]

## 2022-01-16 ENCOUNTER — Encounter: Payer: Self-pay | Admitting: Obstetrics and Gynecology

## 2022-01-16 LAB — CBC
HCT: 26.7 % — ABNORMAL LOW (ref 36.0–46.0)
Hemoglobin: 8.8 g/dL — ABNORMAL LOW (ref 12.0–15.0)
MCH: 32.5 pg (ref 26.0–34.0)
MCHC: 33 g/dL (ref 30.0–36.0)
MCV: 98.5 fL (ref 80.0–100.0)
Platelets: 187 10*3/uL (ref 150–400)
RBC: 2.71 MIL/uL — ABNORMAL LOW (ref 3.87–5.11)
RDW: 14 % (ref 11.5–15.5)
WBC: 13.3 10*3/uL — ABNORMAL HIGH (ref 4.0–10.5)
nRBC: 0 % (ref 0.0–0.2)

## 2022-01-16 MED ORDER — SODIUM CHLORIDE 0.9 % IV SOLN: Freq: Once | INTRAVENOUS | Status: AC

## 2022-01-16 MED ORDER — SODIUM CHLORIDE 0.9 % IV SOLN
300.0000 mg | Freq: Once | INTRAVENOUS | Status: AC
  Administered 2022-01-16: 300 mg via INTRAVENOUS
  Filled 2022-01-16: qty 300

## 2022-01-16 MED ORDER — SODIUM CHLORIDE 0.9 % IV SOLN: INTRAVENOUS | Status: DC | PRN

## 2022-01-16 NOTE — Anesthesia Postprocedure Evaluation (Signed)
Anesthesia Post Note  Patient: Marcie Mowers  Procedure(s) Performed: [place]  Patient location during evaluation: Mother Baby Anesthesia Type: Spinal Level of consciousness: oriented and awake and alert Pain management: pain level controlled Vital Signs Assessment: post-procedure vital signs reviewed and stable Respiratory status: spontaneous breathing and respiratory function stable Cardiovascular status: blood pressure returned to baseline and stable Postop Assessment: no headache, no backache, no apparent nausea or vomiting and able to ambulate Anesthetic complications: no   No notable events documented.   Last Vitals:  Vitals:   01/16/22 0300 01/16/22 0500  BP:    Pulse: 66 73  Resp:    Temp:    SpO2: 95% 100%    Last Pain:  Vitals:   01/15/22 2329  TempSrc: Oral  PainSc:                  Estill Batten

## 2022-01-16 NOTE — Clinical Social Work Maternal (Signed)
  CLINICAL SOCIAL WORK MATERNAL/CHILD NOTE  Patient Details  Name: [name] MRN: [id number] Date of Birth: Jan 08, 1991  Date:  01/16/2022  Clinical Social Worker Initiating Note:  Doran Clay RN BSN Case Manager Date/Time: Initiated:  01/16/22/0920     Child's Name:  [name]   Biological Parents:  Mother, Father   Need for Interpreter:  None   Reason for Referral:  Behavioral Health Concerns   Address:  [place] [address]    Phone number:  [phone] (home)     Additional phone number: NA  Household Members/Support Persons (HM/SP):   Household Member/Support Person 1   HM/SP Name Relationship DOB or Age  HM/SP -[place] husband    HM/SP -2        HM/SP -3        HM/SP -4        HM/SP -5        HM/SP -6        HM/SP -7        HM/SP -8          Natural Supports (not living in the home):  Parent   Professional Supports:     Employment:     Type of Work:     Education:      Homebound arranged:    Pensions consultant:  Multimedia programmer    Other Resources:      Cultural/Religious Considerations Which May Impact Care:  NA  Strengths:  Ability to meet basic needs  , Compliance with medical plan  , Home prepared for child  , Pediatrician chosen   Psychotropic Medications:         Pediatrician:    [employer]  Pediatrician List:   [facility]      Pediatrician Fax Number:    Risk Factors/Current Problems:  Mental Health Concerns     Cognitive State:  Alert  , Insightful  , Able to Concentrate     Mood/Affect:  Calm  , Happy     CSW Assessment: TOC consult received for [place] score of 9 and patient has a history of anxiety and depression.  RNCM met with patient at the bedside, she is currently breastfeeding infant, husband is at the bedside.  They live in [place], this is their first baby.   Mother  reports that they are doing well. She has a history of anxiety and depression and is taking Zoloft and Wellbutrin, during her pregnancy they went down on her dosage and she says that she could tell a difference with increased depression but she was able to manage.  Now that the baby has been born she will follow up with her PCP Dr. Vidal Schwalbe in November and they will discuss going back up on her medication dosage.  Patient understands that if her symptoms exacerbate that she may need to follow up sooner.  Provided patient with a Postpartum Depression Progress Sheet to keep tract of symptoms.    She reports that they have everything they need for infant at home, car seat, crib, diapers, clothes. Pediatrician chosen is [facility].     CSW Plan/Description:  No Further Intervention Required/No Barriers to Discharge    Shelbie Hutching, RN 01/16/2022, 1:20 PM

## 2022-01-16 NOTE — Lactation Note (Signed)
This note was copied from a baby's chart. Lactation Consultation Note  Patient Name: [name] EAVWU'J Date: 01/16/2022 Reason for consult: Follow-up assessment;Primapara;Term;Other (Comment) (elective c-section, LGA) Age:33 hours  Maternal Data Has patient been taught Hand Expression?: Yes Does the patient have breastfeeding experience prior to this delivery?: No  Lactation follow-up. Mom has been independent with feedings. She does report difficulties with baby accepting the L breast. We discussed positional changes that keep baby lying on the same side, hand expression prior to latching for flow/reward, and catching baby early.  Feeding Mother's Current Feeding Choice: Breast Milk and Formula  Baby voiding and stooling well.   LATCH Score   Lactation Tools Discussed/Used    Interventions Interventions: Breast feeding basics reviewed;Hand express;Position options;Education (tips for breast acceptance on both sides, pumping alternate breast if needed, keeping baby awake/alert at the breast)  Mom plans to attempt independently on L breast with tips/strategies given and will call for assistance if needed. LC did suggest possible need to express breast if baby continues to have difficulty on that side. Mom open to all information.  Discharge Pump: Personal  Consult Status Consult Status: Follow-up    Lavonia Drafts 01/16/2022, 11:18 AM

## 2022-01-16 NOTE — Progress Notes (Signed)
Postop Day 1  Subjective: Doing well, no complaints.  Tolerating regular diet, pain with PO meds, voiding and ambulating without difficulty.  No CP SOB Fever,Chills, N/V or leg pain; denies nipple or breast pain; no HA change of vision, RUQ/epigastric pain  Objective: BP 118/79 (BP Location: Right Arm)   Pulse 75   Temp 98.2 F (36.8 C) (Oral)   Resp 18   Ht 5\' 7"  (1.702 m)   Wt 90.7 kg   LMP  (LMP Unknown)   SpO2 100%   Breastfeeding Unknown   BMI 31.32 kg/m    Physical Exam:  General: NAD Breasts: soft/nontender CV: RRR Pulm: nl effort, CTABL Abdomen: soft, NT, BS x 4 Incision: Dsg CDI  Lochia: small Uterine Fundus: fundus firm and 1 fb below umbilicus DVT Evaluation: no cords, ttp LEs   Recent Labs    01/15/22 0534 01/16/22 0514  HGB 12.2 8.8*  HCT 36.8 26.7*  WBC 11.2* 13.3*  PLT 217 187    Assessment/Plan: 31 y.o. G1P1001 postpartum day # 1  - Continue routine PP care, encouraged ambulation, will remove dressing in shower and have honeycomb dsg placed.  - Lactation consult - Discussed contraceptive options including implant, IUDs hormonal and non-hormonal, injection, pills/ring/patch, condoms, and NFP.  - Acute blood loss anemia - hemodynamically stable and asymptomatic; start po ferrous sulfate BID with stool softeners; Venofer ordered.  - Immunization status: declined Tdap    Disposition: Does not desire Dc home today.     Francetta Found, CNM 01/16/2022  10:04 AM

## 2022-01-16 NOTE — Anesthesia Post-op Follow-up Note (Signed)
  Anesthesia Pain Follow-up Note  Patient: [name]  Day #: 1  Date of Follow-up: 01/16/2022 Time: 7:51 AM  Last Vitals:  Vitals:   01/16/22 0300 01/16/22 0500  BP:    Pulse: 66 73  Resp:    Temp:    SpO2: 95% 100%    Level of Consciousness: alert  Pain: mild   Side Effects:None  Catheter Site Exam:clean, dry, no drainage  Anti-Coag Meds (From admission, onward)   Start     Dose/Rate Route Frequency Ordered Stop   01/16/22 0900  enoxaparin (LOVENOX) injection 45 mg        0.5 mg/kg  90.7 kg Subcutaneous Every 24 hours 01/15/22 1020         Plan: D/C from anesthesia care at surgeon's request  Marcellus Pulliam,  Clearnce Sorrel

## 2022-01-16 NOTE — Lactation Note (Signed)
This note was copied from a baby's chart. Lactation Consultation Note  Patient Name: [name] AQTMA'U Date: 01/16/2022 Reason for consult: Follow-up assessment;Mother's request;Primapara;Term;Breast augmentation Age:33 hours  Maternal Data Has patient been taught Hand Expression?: Yes Does the patient have breastfeeding experience prior to this delivery?: No  Mom has hx of breast augmentation; half behind the muscle/half in front of muscle. Incision under breast; healed well.  Feeding Mother's Current Feeding Choice: Breast Milk and Formula  Baby has had feeding side preference of R. Multiple attempts to have baby accept left breast- changes in position, additional support pillows, calming, swaddling... Baby did not accept and sustain latch for any period of time. Hand expression on this side proved difficult.  LATCH Score Latch: Repeated attempts needed to sustain latch, nipple held in mouth throughout feeding, stimulation needed to elicit sucking reflex.  Audible Swallowing: A few with stimulation  Type of Nipple: Everted at rest and after stimulation  Comfort (Breast/Nipple): Soft / non-tender  Hold (Positioning): Assistance needed to correctly position infant at breast and maintain latch.  LATCH Score: 7  After diaper change (void/stool) baby was transitioned to R breast, arm placed under the breast, tummy turned in, sandwiched tissue and baby latched after 1-2 attempts; sustained latch, no discomfort noted, swallow heard.  Lactation Tools Discussed/Used   Mom has [employer]; advised to call for EBP to assist with breast stimulation and milk removal while baby is having feeding difficulties on the L breast. Manual pump provided.  Interventions Interventions: Breast feeding basics reviewed;Assisted with latch;Hand express;Breast compression;Support pillows;Position options;Adjust position;Education  Education given on positional changes, feeding with early  cues, importance of attempts on L breast, use of hand pump post attempts/feedings for extra stimulation and insurance of milk removal.   Discharge Pump: Advised to call insurance company;Manual  Consult Status Consult Status: Follow-up    Lavonia Drafts 01/16/2022, 2:48 PM

## 2022-01-17 NOTE — Progress Notes (Signed)
Post Partum Day 2 Subjective: Doing well, no complaints.  Tolerating regular diet, pain with PO meds, voiding and ambulating without difficulty.  No CP SOB Fever,Chills, N/V or leg pain; denies nipple or breast pain, no HA change of vision, RUQ/epigastric pain  Objective: BP 103/68 (BP Location: Right Arm)   Pulse 81   Temp 98.2 F (36.8 C) (Oral)   Resp 18   Ht 5\' 7"  (1.702 m)   Wt 90.7 kg   LMP  (LMP Unknown)   SpO2 98%   Breastfeeding Unknown   BMI 31.32 kg/m    Physical Exam:  General: NAD Breasts: soft/nontender CV: RRR Pulm: nl effort, CTABL Abdomen: soft, NT, BS x 4 Incision: Dsg CDI/Honeycomb dressing in place, intact/no erythema or drainage Lochia: moderate Uterine Fundus: fundus firm and 1 fb below umbilicus DVT Evaluation: no cords, ttp LEs   Recent Labs    01/15/22 0534 01/16/22 0514  HGB 12.2 8.8*  HCT 36.8 26.7*  WBC 11.2* 13.3*  PLT 217 187    Assessment/Plan: 31 y.o. G1P1001 postpartum day # 2  - Continue routine PP care - Lactation consult - Discussed contraceptive options including implant, IUDs hormonal and non-hormonal, injection, pills/ring/patch, condoms, and NFP.  - Acute blood loss anemia - hemodynamically stable and asymptomatic; start po ferrous sulfate BID with stool softeners, received IV venofer  - Immunization status: all Imms up to date  Disposition: Does not desire Dc home today. Desiring to remain inpatient to continue with breastfeeding assistance.  Gertie Fey, CNM 01/17/2022 9:14 AM

## 2022-01-17 NOTE — Lactation Note (Signed)
This note was copied from a baby's chart. Lactation Consultation Note  Patient Name: [name] Today's Date: 01/17/2022   Age:33 hours  Maternal Data    Feeding  Mom stated that she was able to breastfeed baby with SNS by herself and that she was encouraged by this, I was able to see the Beginning of the feeding on second breast with dad assisting, baby was alert and nursing well.  [facility] Score                    Lactation Tools Discussed/Used    Interventions    Discharge    Consult Status      Ferol Luz 01/17/2022, 7:30 PM

## 2022-01-17 NOTE — Lactation Note (Addendum)
This note was copied from a baby's chart. Lactation Consultation Note  Patient Name: [name] KZSWF'U Date: 01/17/2022 Reason for consult: Follow-up assessment;Primapara;Term Age:33 hours  Maternal Data Has patient been taught Hand Expression?: Yes Does the patient have breastfeeding experience prior to this delivery?: No  Feeding Mother's Current Feeding Choice: Breast Milk and Donor Milk Mom assisted with latching baby to left breast in football hold as baby has not nursed well on this side and has also had 8% wt loss, mom unable to hand express any colostrum and has had breast augmentation.  Baby would latch  but would suck a few times then stop, needing much stimulation, baby switched to right breast with SNS and 10 cc donor milk, baby latched well and then was able to actively nurse taking the 10 cc slowly and with little stimulation. As mom and baby will most likely be d/c'd tomorrow, mom shown also how to pace feed 10cc donor milk in bottle with slow flow nipple after baby came off breast.  Baby did well with paced bottle feeding.  Mom prefers SNS, but I felt with mom's history of depression that she may need ways to feed baby if use of SNS becomes frustrating.  Mom pumped breast with DEBP after to stimulate milk production, no drops were noted in flanges.  ENcouraged mom to pump breasts after every other feeding to not tire her as all of these steps can be very tiring and frustrating, but to offer some extra stimulation to increase supply.    LATCH Score Latch: Grasps breast easily, tongue down, lips flanged, rhythmical sucking.  Audible Swallowing: A few with stimulation (a few with breast only, audible swallows with SNS on left)  Type of Nipple: Everted at rest and after stimulation  Comfort (Breast/Nipple): Filling, red/small blisters or bruises, mild/mod discomfort  Hold (Positioning): Assistance needed to correctly position infant at breast and maintain latch.  LATCH  Score: 7   Lactation Tools Discussed/Used Tools: Pump Breast pump type: Double-Electric Breast Pump Pump Education: Setup, frequency, and cleaning;Milk Storage Reason for Pumping: to increase milk supply by stimulation Pumping frequency: at least after every other feeding Pumped volume: 0 mL LC name and no written on white board Interventions Interventions: Assisted with latch;Skin to skin;Hand express;Support pillows;Education  Discharge Pump: DEBP;Manual;Advised to call insurance company [facility] Program: No  Consult Status Consult Status: Follow-up Date: 01/17/22 Follow-up type: In-patient    Ferol Luz 01/17/2022, 12:42 PM

## 2022-01-18 ENCOUNTER — Ambulatory Visit: Payer: Self-pay

## 2022-01-18 DIAGNOSIS — D62 Acute posthemorrhagic anemia: Secondary | ICD-10-CM

## 2022-01-18 MED ORDER — ENOXAPARIN SODIUM 60 MG/0.6ML IJ SOSY: 0.5000 mg/kg | PREFILLED_SYRINGE | INTRAMUSCULAR | 0 refills | Status: DC

## 2022-01-18 MED ORDER — ACETAMINOPHEN 500 MG PO TABS: 500.0000 mg | ORAL_TABLET | Freq: Four times a day (QID) | ORAL | 0 refills | Status: DC | PRN

## 2022-01-18 MED ORDER — IBUPROFEN 600 MG PO TABS: 600.0000 mg | ORAL_TABLET | Freq: Four times a day (QID) | ORAL | Status: AC | PRN

## 2022-01-18 MED ORDER — OXYCODONE HCL 5 MG PO TABS: 5.0000 mg | ORAL_TABLET | Freq: Four times a day (QID) | ORAL | 0 refills | Status: DC | PRN

## 2022-01-18 MED ORDER — FERROUS SULFATE 325 (65 FE) MG PO TABS: 325.0000 mg | ORAL_TABLET | Freq: Two times a day (BID) | ORAL | Status: DC

## 2022-01-18 NOTE — Lactation Note (Signed)
This note was copied from a baby's chart. Lactation Consultation Note  Patient Name: [name] KWIOX'B Date: 01/18/2022 Reason for consult: Follow-up assessment;Primapara;Term;RN request;Breastfeeding assistance Age:33 hours  Maternal Data This is mom's first baby, C/S vacuum assisted delivery. Mom with history of PCOS, breast enhancement 2012(per mom implants were put in under the muscle and areola /nipple was not altered), anxiety, depression. Feeding plan which included SNS/slow flow bottle and donor milk supplementation in addition to breastfeeding was initiated yesterday. Today, mom is breastfeeding and dad offers supplemental milk via slow flow bottle. Care nurse has instructed parents on paced bottle feeding. Mom has pumped a few times post breastfeeding.  Has patient been taught Hand Expression?: Yes Does the patient have breastfeeding experience prior to this delivery?: No  Feeding Mother's Current Feeding Choice: Breast Milk and Formula  Assisted mom with maximizing position and latch technique. Baby noted to roll in bottom lip, slight rolling in of upper lip. Mom instructed how to correct latch. Mom was able to independently correct latch. Multiple audible swallows noted. Until mom's milk supply is increased mom has chosen to supplement baby with formula after speaking with Pediatrician. Instructed mom on handing and use of formula.  LATCH Score Latch: Grasps breast easily, tongue down, lips flanged, rhythmical sucking. (Baby with lips rolled inward on. Instructed mom how to flange baby's lips outward on to areola.)  Audible Swallowing: A few with stimulation  Type of Nipple: Everted at rest and after stimulation  Comfort (Breast/Nipple): Filling, red/small blisters or bruises, mild/mod discomfort  Hold (Positioning): No assistance needed to correctly position infant at breast.  LATCH Score: 8   Interventions Interventions: Breast feeding basics reviewed;Assisted  with latch;Breast massage;Support pillows;Position options;Education;Adjust position Reviewed with parents what to expect when baby is breastfeeding in the first two weeks of baby's life.  Discharge Pump: Rented (Mom rented Howard from [facility]  Consult Status  PRN  Update provided to care nurse.  [name] 01/18/2022, 1:52 PM

## 2022-01-18 NOTE — Progress Notes (Signed)
D/C home/wheelchair out/verb understanding of D/C instru

## 2022-01-29 DIAGNOSIS — N898 Other specified noninflammatory disorders of vagina: Secondary | ICD-10-CM | POA: Diagnosis not present

## 2022-02-05 ENCOUNTER — Ambulatory Visit: Payer: BC Managed Care – PPO | Admitting: Family

## 2022-02-05 ENCOUNTER — Encounter: Payer: Self-pay | Admitting: Family

## 2022-02-11 NOTE — Telephone Encounter (Signed)
LVM to call back and reschedule appt

## 2022-02-17 ENCOUNTER — Ambulatory Visit: Payer: BC Managed Care – PPO | Admitting: Family

## 2022-02-24 ENCOUNTER — Encounter: Payer: Self-pay | Admitting: Family

## 2022-02-24 ENCOUNTER — Ambulatory Visit: Payer: BC Managed Care – PPO | Admitting: Family

## 2022-02-24 VITALS — BP 118/66 | HR 76 | Temp 98.8°F | Ht 67.0 in | Wt 171.2 lb

## 2022-02-24 DIAGNOSIS — E7800 Pure hypercholesterolemia, unspecified: Secondary | ICD-10-CM | POA: Diagnosis not present

## 2022-02-24 DIAGNOSIS — G4700 Insomnia, unspecified: Secondary | ICD-10-CM | POA: Diagnosis not present

## 2022-02-24 DIAGNOSIS — F32A Depression, unspecified: Secondary | ICD-10-CM | POA: Diagnosis not present

## 2022-02-24 DIAGNOSIS — F419 Anxiety disorder, unspecified: Secondary | ICD-10-CM | POA: Diagnosis not present

## 2022-02-24 MED ORDER — ESZOPICLONE 3 MG PO TABS: 3.0000 mg | ORAL_TABLET | Freq: Every day | ORAL | 2 refills | Status: DC

## 2022-02-24 MED ORDER — SERTRALINE HCL 100 MG PO TABS: 100.0000 mg | ORAL_TABLET | Freq: Every evening | ORAL | 3 refills | Status: DC

## 2022-02-24 MED ORDER — BUPROPION HCL ER (XL) 300 MG PO TB24: 300.0000 mg | ORAL_TABLET | Freq: Every morning | ORAL | 2 refills | Status: DC

## 2022-02-24 NOTE — Patient Instructions (Addendum)
Start zoloft 100mg  for a couple of weeks and then increase to zoloft 150mg   Start wellbutrin 300mg  for a couple of weeks and then increase to wellbutrin 450mg   As discussed, you may resume Lunesta 3 mg after you have returned to previous doses of Zoloft and Wellbutrin and tolerating everything well.

## 2022-02-24 NOTE — Progress Notes (Signed)
Subjective:    Patient ID: [name], female    DOB: 09-05-1990, 31 y.o.   MRN: [id number]  CC: [name] is a 31 y.o. female who presents today for follow up.   HPI: She would like to return to previous doses. She is not breastfeeding        Currently she is on Zoloft 50 mg, previously been on 150 mg.  Currently she is on Wellbutrin 150 mg, previously had been on 450 mg.  She would like to resume Lunesta.  She has trouble staying asleep.  She tolerated this medication regimen very well .   no thoughts of hurting herself or anyone else.  She had a cesarean section,  01/15/22.  Healthy baby boy  No alcohol use drug use.  No history of anorexia or bulimia or seizure.  Previously followed with Dr Modesta Messing, for MDD, insomnia.  last appointment 05/23/21 She was on zoloft 150mg  qpm, wellbutrin 450mg  and eszopiclone 3mg  as managed by dr hisada.    HISTORY:  Past Medical History:  Diagnosis Date   Asthma    PCOS (polycystic ovarian syndrome)    UTI (urinary tract infection)    Past Surgical History:  Procedure Laterality Date   BREAST ENHANCEMENT SURGERY  2012   CESAREAN SECTION N/A 01/15/2022   Procedure: CESAREAN SECTION;  Surgeon: Ouida Sills, Gwen Her, MD;  Location: [facility] ORS;  Service: Obstetrics;  Laterality: N/A;   SHOULDER SURGERY     TONSILLECTOMY     Family History  Problem Relation Age of Onset   Arthritis Mother    Heart disease Mother    Stroke Mother    Hypertension Mother    Mental illness Mother    Diabetes Mother    Factor V Leiden deficiency Mother    Sleep apnea Mother    Depression Mother    Drug abuse Mother    Mental illness Father    Diabetes Father    CAD Father    Sleep apnea Father    Bleeding Disorder Father        prothrombin gene mutuation   Bipolar disorder Father    Crohn's disease Brother    Kidney failure Paternal Grandfather    Suicidality Maternal Grandfather    Bipolar disorder Paternal Aunt    Clotting disorder  Paternal Aunt    Clotting disorder Paternal Grandmother    Breast cancer Neg Hx    Ovarian cancer Neg Hx    Colon cancer Neg Hx     Allergies: Aspirin, Levaquin [levofloxacin in d5w], and Sulfa antibiotics Current Outpatient Medications on File Prior to Visit  Medication Sig Dispense Refill   acetaminophen (TYLENOL) 500 MG tablet Take 1 tablet (500 mg total) by mouth every 6 (six) hours as needed. 30 tablet 0   buPROPion (WELLBUTRIN XL) 150 MG 24 hr tablet Take 1 tablet (150 mg total) by mouth every morning. 90 tablet 1   ibuprofen (ADVIL) 600 MG tablet Take 1 tablet (600 mg total) by mouth every 6 (six) hours as needed.     sertraline (ZOLOFT) 50 MG tablet Take 1 tablet (50 mg total) by mouth at bedtime. 90 tablet 1   No current facility-administered medications on file prior to visit.    Social History   Tobacco Use   Smoking status: Never   Smokeless tobacco: Never  Vaping Use   Vaping Use: Never used  Substance Use Topics   Alcohol use: Not Currently   Drug use: No  Review of Systems  Constitutional:  Negative for chills and fever.  Respiratory:  Negative for cough.   Cardiovascular:  Negative for chest pain and palpitations.  Gastrointestinal:  Negative for nausea and vomiting.  Psychiatric/Behavioral:  Positive for sleep disturbance. Negative for suicidal ideas.       Objective:    BP 118/66 (BP Location: Left Arm, Patient Position: Sitting, Cuff Size: Normal)   Pulse 76   Temp 98.8 F (37.1 C) (Oral)   Ht 5\' 7"  (1.702 m)   Wt 171 lb 3.2 oz (77.7 kg)   SpO2 99%   Breastfeeding No   BMI 26.81 kg/m  BP Readings from Last 3 Encounters:  02/24/22 118/66  01/18/22 115/75  12/06/21 118/70   Wt Readings from Last 3 Encounters:  02/24/22 171 lb 3.2 oz (77.7 kg)  01/15/22 200 lb (90.7 kg)  12/06/21 189 lb 9.6 oz (86 kg)    Physical Exam Vitals reviewed.  Constitutional:      Appearance: She is well-developed.  Eyes:     Conjunctiva/sclera: Conjunctivae  normal.  Cardiovascular:     Rate and Rhythm: Normal rate and regular rhythm.     Pulses: Normal pulses.     Heart sounds: Normal heart sounds.  Pulmonary:     Effort: Pulmonary effort is normal.     Breath sounds: Normal breath sounds. No wheezing, rhonchi or rales.  Skin:    General: Skin is warm and dry.  Neurological:     Mental Status: She is alert.  Psychiatric:        Speech: Speech normal.        Behavior: Behavior normal.        Thought Content: Thought content normal.        Assessment & Plan:   Problem List Items Addressed This Visit       Other   Anxiety and depression - Primary    Chronic, suboptimal control.  We will titrate to resume previous doses.  Start Zoloft 150 mg, Wellbutrin 450 mg however patient is very aware to titrate slowly.  She will resume Lunesta 3 mg once of Zoloft and Wellbutrin titration complete.  I looked up patient on [place] Controlled Substances Reporting System PMP AWARE and saw no activity that raised concern of inappropriate use.  Follow-up in 3 months time      Relevant Medications   buPROPion (WELLBUTRIN XL) 300 MG 24 hr tablet   sertraline (ZOLOFT) 100 MG tablet   Insomnia   Relevant Medications   Eszopiclone 3 MG TABS   Other Visit Diagnoses     Elevated LDL cholesterol level       Relevant Orders   Lipid panel        I have discontinued [name]'s oxyCODONE, ferrous sulfate, and enoxaparin. I am also having her start on buPROPion and sertraline. Additionally, I am having her maintain her buPROPion, sertraline, acetaminophen, ibuprofen, and Eszopiclone.   Meds ordered this encounter  Medications   buPROPion (WELLBUTRIN XL) 300 MG 24 hr tablet    Sig: Take 1 tablet (300 mg total) by mouth every morning. Take with wellbutrin 150mg  tablet    Dispense:  90 tablet    Refill:  2    Order Specific Question:   Supervising Provider    Answer:   02/05/22 L [[id number]]   sertraline (ZOLOFT) 100 MG tablet    Sig: Take 1  tablet (100 mg total) by mouth every evening. Take with 50mg  zoloft tablet.  Dispense:  90 tablet    Refill:  3    Order Specific Question:   Supervising Provider    Answer:   Duncan Dull L [[id number]]   Eszopiclone 3 MG TABS    Sig: Take 1 tablet (3 mg total) by mouth at bedtime. Take immediately before bedtime    Dispense:  30 tablet    Refill:  2    Order Specific Question:   Supervising Provider    Answer:   Sherlene Shams [[id number]]    Return precautions given.   Risks, benefits, and alternatives of the medications and treatment plan prescribed today were discussed, and patient expressed understanding.   Education regarding symptom management and diagnosis given to patient on AVS.  Continue to follow with [name], FNP for routine health maintenance.   Ulysees Barns and I agreed with plan.   Rennie Plowman, FNP

## 2022-02-24 NOTE — Assessment & Plan Note (Signed)
Chronic, suboptimal control.  We will titrate to resume previous doses.  Start Zoloft 150 mg, Wellbutrin 450 mg however patient is very aware to titrate slowly.  She will resume Lunesta 3 mg once of Zoloft and Wellbutrin titration complete.  I looked up patient on [place] Controlled Substances Reporting System PMP AWARE and saw no activity that raised concern of inappropriate use.  Follow-up in 3 months time

## 2022-02-28 DIAGNOSIS — Z1332 Encounter for screening for maternal depression: Secondary | ICD-10-CM | POA: Diagnosis not present

## 2022-03-03 DIAGNOSIS — J069 Acute upper respiratory infection, unspecified: Secondary | ICD-10-CM | POA: Diagnosis not present

## 2022-03-03 DIAGNOSIS — Z20822 Contact with and (suspected) exposure to covid-19: Secondary | ICD-10-CM | POA: Diagnosis not present

## 2022-03-03 DIAGNOSIS — Z6827 Body mass index (BMI) 27.0-27.9, adult: Secondary | ICD-10-CM | POA: Diagnosis not present

## 2022-03-03 DIAGNOSIS — Z03818 Encounter for observation for suspected exposure to other biological agents ruled out: Secondary | ICD-10-CM | POA: Diagnosis not present

## 2022-04-30 ENCOUNTER — Ambulatory Visit: Payer: BC Managed Care – PPO

## 2022-05-06 ENCOUNTER — Encounter: Payer: Self-pay | Admitting: Physical Therapy

## 2022-05-06 ENCOUNTER — Ambulatory Visit: Payer: BC Managed Care – PPO | Attending: Family | Admitting: Physical Therapy

## 2022-05-06 DIAGNOSIS — M5459 Other low back pain: Secondary | ICD-10-CM | POA: Diagnosis not present

## 2022-05-06 DIAGNOSIS — M533 Sacrococcygeal disorders, not elsewhere classified: Secondary | ICD-10-CM | POA: Diagnosis not present

## 2022-05-06 DIAGNOSIS — M6208 Separation of muscle (nontraumatic), other site: Secondary | ICD-10-CM | POA: Insufficient documentation

## 2022-05-06 DIAGNOSIS — R278 Other lack of coordination: Secondary | ICD-10-CM | POA: Insufficient documentation

## 2022-05-06 NOTE — Patient Instructions (Signed)
  Lengthen Back rib by L  shoulder ( winging)    Lie on R  side , pillow between knees and under head  Pull  L arm overhead over mattress, grab the edge of mattress,pull it upward, drawing elbow away from ears  Breathing 10 reps  Brushing arm with 3/4 turn onto pillow behind back  Lying on R  side ,Pillow/ Block between knees     dragging top forearm across ribs below breast rotating 3/4 turn,  rotating  _L_ only this week ,  relax onto the pillow behind the back  and then back to other palm , maintain top palm on body whole top and not lift shoulder  ___ 

## 2022-05-06 NOTE — Therapy (Signed)
OUTPATIENT PHYSICAL THERAPY EVALUATION   Patient Name: [name] MRN: [id number] DOB:[birth year], 32 y.o., female Today's Date: 05/06/2022   PT End of Session - 05/06/22 1653     Visit Number 1    Number of Visits 10    Date for PT Re-Evaluation 07/15/22    PT Start Time 1645    PT Stop Time 1715    PT Time Calculation (min) 30 min             Past Medical History:  Diagnosis Date   Asthma    PCOS (polycystic ovarian syndrome)    UTI (urinary tract infection)    Past Surgical History:  Procedure Laterality Date   BREAST ENHANCEMENT SURGERY  2012   CESAREAN SECTION N/A 01/15/2022   Procedure: CESAREAN SECTION;  Surgeon: Schermerhorn, Ihor Austin, MD;  Location: [facility] ORS;  Service: Obstetrics;  Laterality: N/A;   SHOULDER SURGERY     TONSILLECTOMY     Patient Active Problem List   Diagnosis Date Noted   Acute blood loss anemia 01/18/2022   Delivery by elective cesarean section 01/15/2022   Pelvic pain 03/26/2020   Elevated cholesterol 11/21/2019   Prothrombin G20210A mutation ([facility]) 07/05/2019   Anxiety and depression 12/30/2017   PCOS (polycystic ovarian syndrome) 12/30/2017   Insomnia 12/30/2017   Rash 09/18/2016   History of UTI 06/17/2016   Heel pain 01/15/2016    PCP: Jason Coop  REFERRING PROVIDER: Schmerhorn  REFERRING DIAG: Diagnosis  N94.2 (ICD-10-CM) - Vaginismus    Rationale for Evaluation and Treatment Rehabilitation  THERAPY DIAG:  Sacrococcygeal disorders, not elsewhere classified  Other lack of coordination  ONSET DATE:   SUBJECTIVE:                                                                                                                                                                                           SUBJECTIVE STATEMENT: 1) Pubic bone : Pt had her first baby 3 months ago and experienced during the pregnancy at the pubic area. Pt also had swollen joints. Pt had a C-section birth with 9 lb 5 oz, requiring vacuum to get  him out.  Pubic pain during sex on top and internally  with pelvic exams which occurred prior and after pregnancy.  3-4/10 at rest, worst 6-7/10   2) LBP that occurred after the pregnancy , with carrying laundry up stairs, carrying baby Irving Copas. At worst 8-9/10. Non radiating.   3) Diastasis recti with pain when being pressed on by pants.   4) constipation : every other day to every 2 days , Sometimes pt has to take stool softeners. Pelvic  floor issues make it harder. Pt has been on anxiety medication and she is not sure if it has side effects. Pt is drinking 16 fl oz. water.  Pt would like to get back to walking 1-2 miles.   PERTINENT HISTORY:    PAIN:  Are you having pain? See above  PRECAUTIONS: None  WEIGHT BEARING RESTRICTIONS: No  FALLS:  Has patient fallen in last 6 months? No  LIVING ENVIRONMENT: Lives with: lives with their family Lives in: House/apartment Stairs: 2 story    OCCUPATION: Asset manage, sitting all day at a computers , using 3 monitors   PLOF: Independent  PATIENT GOALS:   Feel stronger and have less pain   OBJECTIVE:    [facility] PT Assessment - 05/06/22 1707       Posture/Postural Control   Posture Comments adducted knees      Strength   Overall Strength Comments PF MMT R 3 reps , L reps without UE support, BLE 4+/5 , B hip abd 5/5,.      Palpation   Spinal mobility no reproduction of LBP with 4 directions of spine, thoracic hypomobile T7-10 L  ,    SI assessment  levelled iliac crest and shoulders , FADIR on L SIJ with more hypomobility/ pain    Palpation comment restricted C section with tenderness at supra pubic area ,             [facility] Adult PT Treatment/Exercise - 05/06/22 1707       Therapeutic Activites    Therapeutic Activities Other Therapeutic Activities    Other Therapeutic Activities FOTO and HEP      Neuro Re-ed    Neuro Re-ed Details  cued for thoracic mobility      Manual Therapy   Manual therapy comments STM/MWM at  L thoracic to promote mobilty                  HOME EXERCISE PROGRAM: See pt instruction section    ASSESSMENT:  CLINICAL IMPRESSION:              Pt is a  32 yo  who presents with  CLBP, constipation, pubic pain, and diastasis recti  which impact QOL, ADL, fitness, and community activities.   Pt's musculoskeletal assessment revealed limited spinal /pelvic/ SIJ  mobility, dyscoordination and strength of pelvic floor mm, PF weakness, adducted knees, poor body mechanics which places strain on the abdominal/pelvic floor mm. These are deficits that indicate an ineffective intraabdominal pressure system associated with increased risk for pt's Sx.   Pt was provided education on etiology of Sx with anatomy, physiology explanation with images along with the benefits of customized pelvic PT Tx based on pt's medical conditions and musculoskeletal deficits.  Explained the physiology of deep core mm coordination and roles of pelvic floor function in urination, defecation, sexual function, and postural control with deep core mm system.   Regional interdependent approaches will yield greater benefits in pt's POC. Pain science education will be helpful as well.   Following Tx today which pt tolerated without complaints, pt demo'd increased spinal mobility. Plan to continue with increasing SIJ mobility at next session and work on lower kinetic chain to minimize adducted knees which will help with minimizing pelvic floor tightness.   Pt benefits from skilled PT.  Session was abbreviated due to late arrival.    OBJECTIVE IMPAIRMENTS decreased activity tolerance, decreased coordination, decreased endurance, decreased mobility, difficulty walking, decreased ROM, decreased strength, decreased safety awareness,  hypomobility, increased muscle spasms, impaired flexibility, improper body mechanics, postural dysfunction, and pain. scar restrictions   ACTIVITY LIMITATIONS  self-care, sleep, home chores,  work tasks    PARTICIPATION LIMITATIONS:  community, gym activities    [place]     are also affecting patient's functional outcome.    REHAB POTENTIAL: Good   CLINICAL DECISION MAKING: Evolving/moderate complexity   EVALUATION COMPLEXITY: Moderate    PATIENT EDUCATION:    Education details: Showed pt anatomy images. Explained muscles attachments/ connection, physiology of deep core system/ spinal- thoracic-pelvis-lower kinetic chain as they relate to pt's presentation, Sx, and past Hx. Explained what and how these areas of deficits need to be restored to balance and function    See Therapeutic activity / neuromuscular re-education section  Answered pt's questions.   Person educated: Patient Education method: Explanation, Demonstration, Tactile cues, Verbal cues, and Handouts Education comprehension: verbalized understanding, returned demonstration, verbal cues required, tactile cues required, and needs further education     PLAN: PT FREQUENCY: 1x/week   PT DURATION: 10 weeks   PLANNED INTERVENTIONS: Therapeutic exercises, Therapeutic activity, Neuromuscular re-education, Balance training, Gait training, Patient/Family education, Self Care, Joint mobilization, Spinal mobilization, Moist heat, Taping, and Manual therapy, dry needling.   PLAN FOR NEXT SESSION: See clinical impression for plan     GOALS: Goals reviewed with patient? Yes  SHORT TERM GOALS: Target date: 06/03/2022    Pt will demo IND with HEP                    Baseline: Not IND            Goal status: INITIAL   LONG TERM GOALS: Target date: 07/15/2022    1.Pt will demo proper deep core coordination without chest breathing and optimal excursion of diaphragm/pelvic floor in order to promote spinal stability and pelvic floor function  Baseline: dyscoordination Goal status: INITIAL  2.  Pt will demo > 5 pt change on FOTO  to improve QOL and function   Pelvic Pain baseline - 17   Bowel   constipation baseline - 49 pts   Lumber baseline  -   Goal status: INITIAL  3.  Pt will demo proper body mechanics in against gravity tasks and ADLs  work tasks, fitness  to minimize straining pelvic floor / back                  Baseline: not IND, improper form that places strain on pelvic floor                Goal status: INITIAL    4. Pt will demo less fingers width < 2 in order to improve DRA and IAP system to promote spinal / pelvic stability for pick up baby/ carrying laundry up stairs  Baseline: 3 fingers width Goal status: INITIAL    5. Pt will demo decreased C section scar restrictions in order to tolerate tight pants and progress to deep core HEP  Baseline: restricted scars  Goal status: INITIAL   6. Pt will demo proper lengthening of pelvic floor mm to enjoy sex and undergo pelvic exams with speculum at OB-GYN appts  Baseline: pain with these activities  Goal status: INITIAL    Jerl Mina, PT 05/06/2022, 4:55 PM

## 2022-05-13 ENCOUNTER — Ambulatory Visit: Payer: BC Managed Care – PPO | Admitting: Physical Therapy

## 2022-05-20 ENCOUNTER — Ambulatory Visit: Payer: BC Managed Care – PPO | Admitting: Physical Therapy

## 2022-05-27 ENCOUNTER — Ambulatory Visit: Payer: BC Managed Care – PPO | Admitting: Physical Therapy

## 2022-05-27 DIAGNOSIS — M6208 Separation of muscle (nontraumatic), other site: Secondary | ICD-10-CM

## 2022-05-27 DIAGNOSIS — M5459 Other low back pain: Secondary | ICD-10-CM

## 2022-05-27 DIAGNOSIS — M533 Sacrococcygeal disorders, not elsewhere classified: Secondary | ICD-10-CM | POA: Diagnosis not present

## 2022-05-27 DIAGNOSIS — R278 Other lack of coordination: Secondary | ICD-10-CM | POA: Diagnosis not present

## 2022-05-27 NOTE — Therapy (Signed)
OUTPATIENT PHYSICAL THERAPY Treatment   Patient Name: [name] MRN: [id number] DOB:August 06, 1990, 32 y.o., female Today's Date: 05/27/2022   PT End of Session - 05/27/22 1643     Visit Number 2    Number of Visits 10    Date for PT Re-Evaluation 07/15/22    PT Start Time 1640    PT Stop Time 1720    PT Time Calculation (min) 40 min    Activity Tolerance Patient tolerated treatment well    Behavior During Therapy [facility] for tasks assessed/performed             Past Medical History:  Diagnosis Date   Asthma    PCOS (polycystic ovarian syndrome)    UTI (urinary tract infection)    Past Surgical History:  Procedure Laterality Date   BREAST ENHANCEMENT SURGERY  2012   CESAREAN SECTION N/A 01/15/2022   Procedure: CESAREAN SECTION;  Surgeon: Ouida Sills, Gwen Her, MD;  Location: [facility] ORS;  Service: Obstetrics;  Laterality: N/A;   SHOULDER SURGERY     TONSILLECTOMY     Patient Active Problem List   Diagnosis Date Noted   Acute blood loss anemia 01/18/2022   Delivery by elective cesarean section 01/15/2022   Pelvic pain 03/26/2020   Elevated cholesterol 11/21/2019   Prothrombin G20210A mutation ([place]) 07/05/2019   Anxiety and depression 12/30/2017   PCOS (polycystic ovarian syndrome) 12/30/2017   Insomnia 12/30/2017   Rash 09/18/2016   History of UTI 06/17/2016   Heel pain 01/15/2016    PCP: Vidal Schwalbe  REFERRING PROVIDER: Schmerhorn  REFERRING DIAG: Diagnosis  N94.2 (ICD-10-CM) - Vaginismus    Rationale for Evaluation and Treatment Rehabilitation  THERAPY DIAG:  Sacrococcygeal disorders, not elsewhere classified  Other lack of coordination  Other low back pain  Rectus diastasis  ONSET DATE:   SUBJECTIVE:     SUBJECTIVE STATEMENT TODAY:  Pt reports LBP with changing diapers. Pt notices L hip pain with stairs.                                                                                                                                                                                          SUBJECTIVE STATEMENT ON EVAL : 1) Pubic bone : Pt had her first baby 3 months ago and experienced during the pregnancy at the pubic area. Pt also had swollen joints. Pt had a C-section birth with 9 lb 5 oz, requiring vacuum to get him out.  Pubic pain during sex on top and internally  with pelvic exams which occurred prior and after pregnancy.  3-4/10 at rest, worst 6-7/10   2) LBP that occurred after the  pregnancy , with carrying laundry up stairs, carrying baby [place]fin Ganja. At worst 8-9/10. Non radiating.   3) Diastasis recti with pain when being pressed on by pants.   4) constipation : every other day to every 2 days , Sometimes pt has to take stool softeners. Pelvic floor issues make it harder. Pt has been on anxiety medication and she is not sure if it has side effects. Pt is drinking 16 fl oz. water.  Pt would like to get back to walking 1-2 miles.   PERTINENT HISTORY:    PAIN:  Are you having pain? See above  PRECAUTIONS: None  WEIGHT BEARING RESTRICTIONS: No  FALLS:  Has patient fallen in last 6 months? No  LIVING ENVIRONMENT: Lives with: lives with their family Lives in: House/apartment Stairs: 2 story    OCCUPATION: Asset manage, sitting all day at a computers , using 3 monitors   PLOF: Independent  PATIENT GOALS:   Feel stronger and have less pain   OBJECTIVE:    [facility] PT Assessment - 05/27/22 1643       Other:   Other/Comments simulated positions with changing table/ t/f to floor, changing diaper on couch ( poor mechanics)      Strength   Overall Strength Comments FADDIR L tightness ( post Tx: improved)      Palpation   SI assessment  levelled iliac crest and shoulders    Palpation comment tightness/ tenderness at ITBand, glut, lateral leg and L foot between big toe and 2nd toe             [facility] Adult PT Treatment/Exercise - 05/27/22 1718       Therapeutic Activites    Other Therapeutic Activities body  mechanics ( see pt instructions)      Neuro Re-ed    Neuro Re-ed Details  cued for IT band, body mechanics for minimizing LBP      Manual Therapy   Manual therapy comments distraction at LLE, STM/MWM at ITBand, glut, lateral leg and L foot between big toe and 2nd toe                HOME EXERCISE PROGRAM: See pt instruction section    ASSESSMENT:  CLINICAL IMPRESSION:              Pt showed levelled pelvic girdle today but required manual Tx to increase L SIJ / ITband, glut, leg mobility. Pt reported less pain at L hip post Tx. Required cues for proper mechanics with changing diapers at changing table, on couch to minimize LBP.   Plan to continue with increasing SIJ mobility at next session and work on lower kinetic chain to minimize adducted knees which will help with minimizing pelvic floor tightness.   Pt benefits from skilled PT.  Session was abbreviated due to late arrival.    OBJECTIVE IMPAIRMENTS decreased activity tolerance, decreased coordination, decreased endurance, decreased mobility, difficulty walking, decreased ROM, decreased strength, decreased safety awareness, hypomobility, increased muscle spasms, impaired flexibility, improper body mechanics, postural dysfunction, and pain. scar restrictions   ACTIVITY LIMITATIONS  self-care, sleep, home chores, work tasks    PARTICIPATION LIMITATIONS:  community, gym activities    [place]     are also affecting patient's functional outcome.    REHAB POTENTIAL: Good   CLINICAL DECISION MAKING: Evolving/moderate complexity   EVALUATION COMPLEXITY: Moderate    PATIENT EDUCATION:    Education details: Showed pt anatomy images. Explained muscles attachments/ connection, physiology of deep core system/ spinal- thoracic-pelvis-lower  kinetic chain as they relate to pt's presentation, Sx, and past Hx. Explained what and how these areas of deficits need to be restored to balance and function    See Therapeutic  activity / neuromuscular re-education section  Answered pt's questions.   Person educated: Patient Education method: Explanation, Demonstration, Tactile cues, Verbal cues, and Handouts Education comprehension: verbalized understanding, returned demonstration, verbal cues required, tactile cues required, and needs further education     PLAN: PT FREQUENCY: 1x/week   PT DURATION: 10 weeks   PLANNED INTERVENTIONS: Therapeutic exercises, Therapeutic activity, Neuromuscular re-education, Balance training, Gait training, Patient/Family education, Self Care, Joint mobilization, Spinal mobilization, Moist heat, Taping, and Manual therapy, dry needling.   PLAN FOR NEXT SESSION: See clinical impression for plan     GOALS: Goals reviewed with patient? Yes  SHORT TERM GOALS: Target date: 06/03/2022    Pt will demo IND with HEP                    Baseline: Not IND            Goal status: INITIAL   LONG TERM GOALS: Target date: 07/15/2022    1.Pt will demo proper deep core coordination without chest breathing and optimal excursion of diaphragm/pelvic floor in order to promote spinal stability and pelvic floor function  Baseline: dyscoordination Goal status: INITIAL  2.  Pt will demo > 5 pt change on FOTO  to improve QOL and function   Pelvic Pain baseline - 17   Bowel  constipation baseline - 49 pts   Lumber baseline  -   Goal status: INITIAL  3.  Pt will demo proper body mechanics in against gravity tasks and ADLs  work tasks, fitness  to minimize straining pelvic floor / back                  Baseline: not IND, improper form that places strain on pelvic floor                Goal status: INITIAL    4. Pt will demo less fingers width < 2 in order to improve DRA and IAP system to promote spinal / pelvic stability for pick up baby/ carrying laundry up stairs  Baseline: 3 fingers width Goal status: INITIAL    5. Pt will demo decreased C section scar restrictions in order to  tolerate tight pants and progress to deep core HEP  Baseline: restricted scars  Goal status: INITIAL   6. Pt will demo proper lengthening of pelvic floor mm to enjoy sex and undergo pelvic exams with speculum at OB-GYN appts  Baseline: pain with these activities  Goal status: INITIAL    Jerl Mina, PT 05/27/2022, 5:28 PM

## 2022-05-27 NOTE — Patient Instructions (Addendum)
Body mechanics training:  Lifting: Squat or ski-track standing (one foot forward, shift weight into back leg as you lift)  Stand at an angle to keep her close to your center    Stair-climbing:  45 deg turned to rail, step to   Changing table:  Standing with ski track stance 45 deg to table instead of forward bending.   Bending down into stroller: Standing with ski track stance 45 deg to table instead of forward bending.   __  Floor to rise with baby      Baby is parallel to you or under arms, kneeling over toes, untucked Scoop baby under forearms as you squeeze shoulder blades back and down to bring baby to you    Tuck toes under, move knees and body towards ottoman/bench Place baby with your body as close to ottoman as possible  Transition to wide minisquat  Exhale to life baby up   __  Scoot hips over to L,  R hand on L thigh/ knee and stretch the IT band

## 2022-06-03 ENCOUNTER — Ambulatory Visit: Payer: BC Managed Care – PPO | Attending: Family | Admitting: Physical Therapy

## 2022-06-03 DIAGNOSIS — M5459 Other low back pain: Secondary | ICD-10-CM | POA: Diagnosis not present

## 2022-06-03 DIAGNOSIS — R278 Other lack of coordination: Secondary | ICD-10-CM | POA: Diagnosis not present

## 2022-06-03 DIAGNOSIS — M533 Sacrococcygeal disorders, not elsewhere classified: Secondary | ICD-10-CM | POA: Diagnosis not present

## 2022-06-03 DIAGNOSIS — M6208 Separation of muscle (nontraumatic), other site: Secondary | ICD-10-CM | POA: Insufficient documentation

## 2022-06-03 NOTE — Therapy (Signed)
OUTPATIENT PHYSICAL THERAPY Treatment   Patient Name: [name] MRN: [id number] DOB:16-Jun-1990, 32 y.o., female Today's Date: 06/03/2022   PT End of Session - 06/03/22 1647     Visit Number 3    Number of Visits 10    Date for PT Re-Evaluation 07/15/22    PT Start Time 1634    PT Stop Time 1715    PT Time Calculation (min) 41 min    Activity Tolerance Patient tolerated treatment well    Behavior During Therapy [facility] for tasks assessed/performed             Past Medical History:  Diagnosis Date   Asthma    PCOS (polycystic ovarian syndrome)    UTI (urinary tract infection)    Past Surgical History:  Procedure Laterality Date   BREAST ENHANCEMENT SURGERY  2012   CESAREAN SECTION N/A 01/15/2022   Procedure: CESAREAN SECTION;  Surgeon: Ouida Sills, Gwen Her, MD;  Location: [facility] ORS;  Service: Obstetrics;  Laterality: N/A;   SHOULDER SURGERY     TONSILLECTOMY     Patient Active Problem List   Diagnosis Date Noted   Acute blood loss anemia 01/18/2022   Delivery by elective cesarean section 01/15/2022   Pelvic pain 03/26/2020   Elevated cholesterol 11/21/2019   Prothrombin G20210A mutation ([place]) 07/05/2019   Anxiety and depression 12/30/2017   PCOS (polycystic ovarian syndrome) 12/30/2017   Insomnia 12/30/2017   Rash 09/18/2016   History of UTI 06/17/2016   Heel pain 01/15/2016    PCP: Vidal Schwalbe  REFERRING PROVIDER: Schmerhorn  REFERRING DIAG: Diagnosis  N94.2 (ICD-10-CM) - Vaginismus    Rationale for Evaluation and Treatment Rehabilitation  THERAPY DIAG:  Sacrococcygeal disorders, not elsewhere classified  Other lack of coordination  Other low back pain  Rectus diastasis  ONSET DATE:   SUBJECTIVE:     SUBJECTIVE STATEMENT TODAY:  Pt reports LBP settled down after 2 days post Tx last week. Pt only felt it when picking up baby from his bath which is on the kitchen counter. Pt notices popping along R back of knee and hip with climbing stairs.  Crackling of ankle on the way down on RLE                                                                                                                                                                                        SUBJECTIVE STATEMENT ON EVAL : 1) Pubic bone : Pt had her first baby 3 months ago and experienced during the pregnancy at the pubic area. Pt also had swollen joints. Pt had a C-section birth with 9 lb 5 oz, requiring vacuum  to get him out.  Pubic pain during sex on top and internally  with pelvic exams which occurred prior and after pregnancy.  3-4/10 at rest, worst 6-7/10   2) LBP that occurred after the pregnancy , with carrying laundry up stairs, carrying baby [employer]. At worst 8-9/10. Non radiating.   3) Diastasis recti with pain when being pressed on by pants.   4) constipation : every other day to every 2 days , Sometimes pt has to take stool softeners. Pelvic floor issues make it harder. Pt has been on anxiety medication and she is not sure if it has side effects. Pt is drinking 16 fl oz. water.  Pt would like to get back to walking 1-2 miles.   PERTINENT HISTORY:    PAIN:  Are you having pain? See above  PRECAUTIONS: None  WEIGHT BEARING RESTRICTIONS: No  FALLS:  Has patient fallen in last 6 months? No  LIVING ENVIRONMENT: Lives with: lives with their family Lives in: House/apartment Stairs: 2 story    OCCUPATION: Asset manage, sitting all day at a computers , using 3 monitors   PLOF: Independent  PATIENT GOALS:   Feel stronger and have less pain   OBJECTIVE:    [facility] PT Assessment - 06/03/22 1723       Palpation   Spinal mobility deviated C5-6 to R with pain, wincing pain at along palpation at interspinal/ paraspinal mm R)   ( Pt reported past injury o R bicep tendon with neck injury 2012)    SI assessment  R SIJ hypomobile (FADDIR tightness)             [facility] Adult PT Treatment/Exercise - 06/03/22 1720       Therapeutic Activites     Other Therapeutic Activities cued for body mechanics with bathing son at kitchen counter  to minimize back pain      Neuro Re-ed    Neuro Re-ed Details  cued for neck stretches and less upper trap overuse, cued for work nexck stretches ,      Modalities   Modalities Moist Heat      Moist Heat Therapy   Number Minutes Moist Heat 10 Minutes    Moist Heat Location --   cervica / sacrum ( during manual Tx)     Manual Therapy   Manual therapy comments distraction at C5-6, medial glide at C5-6 R , STM/MWM  interspinal/ paraspinal along R thoracic / cervical               HOME EXERCISE PROGRAM: See pt instruction section    ASSESSMENT:  CLINICAL IMPRESSION:              Pt continued to require body mechanics with bathing baby in a tub on top of kitchen counter to minimize LBP. R SIJ hypomobility was addressed today as L SIJ mobility was improved.   Pt also required manual Tx to address increased paraspinal/ interspinal mm along R spine to cervical C5-6 level where segments were deviated ( hx of neck and shoulder injury).  Pt demo'd improved scapular mm and cervical mm mobility with less tendency for upper trap overuse.    Plan to continue with increasing SIJ mobility at next session and work on lower kinetic chain to minimize adducted knees which will help with minimizing pelvic floor tightness.   Pt benefits from skilled PT.      OBJECTIVE IMPAIRMENTS decreased activity tolerance, decreased coordination, decreased endurance, decreased mobility, difficulty walking, decreased ROM, decreased  strength, decreased safety awareness, hypomobility, increased muscle spasms, impaired flexibility, improper body mechanics, postural dysfunction, and pain. scar restrictions   ACTIVITY LIMITATIONS  self-care, sleep, home chores, work tasks    PARTICIPATION LIMITATIONS:  community, gym activities    [place]     are also affecting patient's functional outcome.    REHAB POTENTIAL:  Good   CLINICAL DECISION MAKING: Evolving/moderate complexity   EVALUATION COMPLEXITY: Moderate    PATIENT EDUCATION:    Education details: Showed pt anatomy images. Explained muscles attachments/ connection, physiology of deep core system/ spinal- thoracic-pelvis-lower kinetic chain as they relate to pt's presentation, Sx, and past Hx. Explained what and how these areas of deficits need to be restored to balance and function    See Therapeutic activity / neuromuscular re-education section  Answered pt's questions.   Person educated: Patient Education method: Explanation, Demonstration, Tactile cues, Verbal cues, and Handouts Education comprehension: verbalized understanding, returned demonstration, verbal cues required, tactile cues required, and needs further education     PLAN: PT FREQUENCY: 1x/week   PT DURATION: 10 weeks   PLANNED INTERVENTIONS: Therapeutic exercises, Therapeutic activity, Neuromuscular re-education, Balance training, Gait training, Patient/Family education, Self Care, Joint mobilization, Spinal mobilization, Moist heat, Taping, and Manual therapy, dry needling.   PLAN FOR NEXT SESSION: See clinical impression for plan     GOALS: Goals reviewed with patient? Yes  SHORT TERM GOALS: Target date: 06/03/2022    Pt will demo IND with HEP                    Baseline: Not IND            Goal status: INITIAL   LONG TERM GOALS: Target date: 07/15/2022    1.Pt will demo proper deep core coordination without chest breathing and optimal excursion of diaphragm/pelvic floor in order to promote spinal stability and pelvic floor function  Baseline: dyscoordination Goal status: INITIAL  2.  Pt will demo > 5 pt change on FOTO  to improve QOL and function   Pelvic Pain baseline - 17   Bowel  constipation baseline - 49 pts   Lumber baseline  -   Goal status: INITIAL  3.  Pt will demo proper body mechanics in against gravity tasks and ADLs  work tasks,  fitness  to minimize straining pelvic floor / back                  Baseline: not IND, improper form that places strain on pelvic floor                Goal status: INITIAL    4. Pt will demo less fingers width < 2 in order to improve DRA and IAP system to promote spinal / pelvic stability for pick up baby/ carrying laundry up stairs  Baseline: 3 fingers width Goal status: INITIAL    5. Pt will demo decreased C section scar restrictions in order to tolerate tight pants and progress to deep core HEP  Baseline: restricted scars  Goal status: INITIAL   6. Pt will demo proper lengthening of pelvic floor mm to enjoy sex and undergo pelvic exams with speculum at OB-GYN appts  Baseline: pain with these activities  Goal status: INITIAL    Jerl Mina, PT 06/03/2022, 4:47 PM

## 2022-06-03 NOTE — Patient Instructions (Signed)
Stretches :   Neck / shoulder stretches:    Lying on back - small sushi roll towel under neck     6 directions of neck,  Chin tuck, pull shoulders blades down, chin up look at sky   5 x   Chin tuck, , pull shoulders blades down,  ear to shoulder,   5 x   Chin tuck, look over shoulder   5 x   _angel wings, lower elbows down , keep arms touching bed  10 reps    ___ _wall stretch Clock stretch with head turns :  stand perpendicular to the wall, L side to the wall Tilt head to wall  Place L palm at 7 o clock Chin tuck like you are looking into armpit Look at "[place] on giant map  Swivel head with chin tucked to look in upper corner of ceiling as if you are look at  [place] on giant map "   5 reps   Switch direction, R palm on wall at 5 o 'clock   Chin tuck like you are looking into armpit Look at "[place]  on giant map  Swivel head with chin tucked to look in upper corner of ceiling as if you are look at  [facility] on giant map "   5 reps

## 2022-06-10 ENCOUNTER — Ambulatory Visit: Payer: BC Managed Care – PPO | Admitting: Physical Therapy

## 2022-06-13 DIAGNOSIS — R051 Acute cough: Secondary | ICD-10-CM | POA: Diagnosis not present

## 2022-06-13 DIAGNOSIS — Z6826 Body mass index (BMI) 26.0-26.9, adult: Secondary | ICD-10-CM | POA: Diagnosis not present

## 2022-06-17 ENCOUNTER — Ambulatory Visit: Payer: BC Managed Care – PPO | Admitting: Physical Therapy

## 2022-06-20 ENCOUNTER — Ambulatory Visit: Payer: BC Managed Care – PPO | Admitting: Physical Therapy

## 2022-06-20 DIAGNOSIS — M5459 Other low back pain: Secondary | ICD-10-CM | POA: Diagnosis not present

## 2022-06-20 DIAGNOSIS — M533 Sacrococcygeal disorders, not elsewhere classified: Secondary | ICD-10-CM

## 2022-06-20 DIAGNOSIS — R278 Other lack of coordination: Secondary | ICD-10-CM

## 2022-06-20 DIAGNOSIS — M6208 Separation of muscle (nontraumatic), other site: Secondary | ICD-10-CM | POA: Diagnosis not present

## 2022-06-20 NOTE — Patient Instructions (Addendum)
ALL FOUR POINTS OF FEET     Minisquat: Scoot buttocks back slight, hinge like you are looking at your reflection on a pond  Knees behind toes,  Inhale to "smell flowers"  Exhale on the rise "like rocket"  Do not lock knees, have more weight across ballmounds of feet, toes relaxed and spread them, not grip them   10 reps x 3 x day   __  Feet care :  Self -feet massage   Handshake : fingers between toes, moving ballmounds/toes back and forth several times while other hand anchors at arch. Do the same at the hind/mid foot.  Heel to toes upward to a letter Big Letter T strokes to spread ballmounds and toes, several times, pinch between webs of toes  Run finger tips along top of foot between long bones "comb between the bones"    Wiggle toes and spread them out when relaxing   __  BEND WITH MINI SQUAT NOT [place]   __    kitchen counter stretches  Hands on kitchen counter,   Palms shoulder width apart  Minisquat postion Trunk is parallel to floor  A) Pull buttocks back to lengthen spine, knees bent  3 breaths   B) Bring R hand to the L, and stretch the R side trunk  3 breaths   Brings hands to center again Do the same to the L side stretch by placing L hand on top of R   D) Modified thread the needle R hand on L thigh, L  thigh pushing out slightly as the R hands pull in,  elbow bent and pulls to theR,  Look under L armpit   Do the same to other side  ____

## 2022-06-20 NOTE — Therapy (Signed)
OUTPATIENT PHYSICAL THERAPY Treatment   Patient Name: [name] MRN: [id number] DOB:03-Aug-1990, 32 y.o., female Today's Date: 06/20/2022   PT End of Session - 06/20/22 1025     Visit Number 4    Number of Visits 10    Date for PT Re-Evaluation 07/15/22    PT Start Time 1020    PT Stop Time 1100    PT Time Calculation (min) 40 min    Activity Tolerance Patient tolerated treatment well    Behavior During Therapy [facility] for tasks assessed/performed             Past Medical History:  Diagnosis Date   Asthma    PCOS (polycystic ovarian syndrome)    UTI (urinary tract infection)    Past Surgical History:  Procedure Laterality Date   BREAST ENHANCEMENT SURGERY  2012   CESAREAN SECTION N/A 01/15/2022   Procedure: CESAREAN SECTION;  Surgeon: Ouida Sills, Gwen Her, MD;  Location: [facility] ORS;  Service: Obstetrics;  Laterality: N/A;   SHOULDER SURGERY     TONSILLECTOMY     Patient Active Problem List   Diagnosis Date Noted   Acute blood loss anemia 01/18/2022   Delivery by elective cesarean section 01/15/2022   Pelvic pain 03/26/2020   Elevated cholesterol 11/21/2019   Prothrombin G20210A mutation ([place]) 07/05/2019   Anxiety and depression 12/30/2017   PCOS (polycystic ovarian syndrome) 12/30/2017   Insomnia 12/30/2017   Rash 09/18/2016   History of UTI 06/17/2016   Heel pain 01/15/2016    PCP: Vidal Schwalbe  REFERRING PROVIDER: Schmerhorn  REFERRING DIAG: Diagnosis  N94.2 (ICD-10-CM) - Vaginismus    Rationale for Evaluation and Treatment Rehabilitation  THERAPY DIAG:  Sacrococcygeal disorders, not elsewhere classified  Other low back pain  Rectus diastasis  Other lack of coordination  ONSET DATE:   SUBJECTIVE:     SUBJECTIVE STATEMENT TODAY:  Pt reports LBP when bending. 7/10 sitting still, 9/10 with activities and picking baby                                                                                                                                                                                           SUBJECTIVE STATEMENT ON EVAL : 1) Pubic bone : Pt had her first baby 3 months ago and experienced during the pregnancy at the pubic area. Pt also had swollen joints. Pt had a C-section birth with 9 lb 5 oz, requiring vacuum to get him out.  Pubic pain during sex on top and internally  with pelvic exams which occurred prior and after pregnancy.  3-4/10 at rest, worst 6-7/10   2) LBP that occurred  after the pregnancy , with carrying laundry up stairs, carrying baby [employer]. At worst 8-9/10. Non radiating.   3) Diastasis recti with pain when being pressed on by pants.   4) constipation : every other day to every 2 days , Sometimes pt has to take stool softeners. Pelvic floor issues make it harder. Pt has been on anxiety medication and she is not sure if it has side effects. Pt is drinking 16 fl oz. water.  Pt would like to get back to walking 1-2 miles.   PERTINENT HISTORY:    PAIN:  Are you having pain? See above  PRECAUTIONS: None  WEIGHT BEARING RESTRICTIONS: No  FALLS:  Has patient fallen in last 6 months? No  LIVING ENVIRONMENT: Lives with: lives with their family Lives in: House/apartment Stairs: 2 story    OCCUPATION: Asset manage, sitting all day at a computers , using 3 monitors   PLOF: Independent  PATIENT GOALS:   Feel stronger and have less pain   OBJECTIVE:     [facility] PT Assessment - 06/20/22 1027       Squat   Comments genu valgus, medial collapse of arches and posterior WBing in heels      Other:   Other/Comments bending with hyperextended knees, squat with knees anterior      Palpation   Spinal mobility LBP nonradiating with R sideflexion and R rotation    SI assessment  R PSIS more anterior,    Palpation comment tightness along B feet, midfoot joints             [facility] Adult PT Treatment/Exercise - 06/20/22 1142       Therapeutic Activites    Other Therapeutic Activities simulated picking  baby with proper squat with 15 lb kettle bell      Neuro Re-ed    Neuro Re-ed Details  excessive cues for proper mini squat for bending and picking up baby, lower knietic chain alignment and awareness , cued for stretches and feet massage      Modalities   Modalities Moist Heat      Moist Heat Therapy   Number Minutes Moist Heat 5 Minutes    Moist Heat Location --   prone , at sacrum ( unbilled)     Manual Therapy   Manual therapy comments STM/MWM at feet B to promote toe abduction/ toe extensions/ DF/EV              HOME EXERCISE PROGRAM: See pt instruction section    ASSESSMENT:  CLINICAL IMPRESSION:               R SIJ hypomobility was addressed today after which pt showed improved mobility and reported less pain with R sideflexion/rotation.   Pt also required manual Tx to address hypomobile B feet which are related to her genu valgus.   Pt required cues for proper alignment and technique for bending without pain and to avoid hyperextended knees which caused her LBP.    Plan to continue with neuromuscular re-education with lower kinetic chain to minimize adducted knees which will help with minimizing pelvic floor tightness/ LBP.   Pt benefits from skilled PT.      OBJECTIVE IMPAIRMENTS decreased activity tolerance, decreased coordination, decreased endurance, decreased mobility, difficulty walking, decreased ROM, decreased strength, decreased safety awareness, hypomobility, increased muscle spasms, impaired flexibility, improper body mechanics, postural dysfunction, and pain. scar restrictions   ACTIVITY LIMITATIONS  self-care, sleep, home chores, work tasks    PARTICIPATION LIMITATIONS:  community, gym activities    PERSONAL FACTORS     are also affecting patient's functional outcome.    REHAB POTENTIAL: Good   CLINICAL DECISION MAKING: Evolving/moderate complexity   EVALUATION COMPLEXITY: Moderate    PATIENT EDUCATION:    Education details: Showed pt  anatomy images. Explained muscles attachments/ connection, physiology of deep core system/ spinal- thoracic-pelvis-lower kinetic chain as they relate to pt's presentation, Sx, and past Hx. Explained what and how these areas of deficits need to be restored to balance and function    See Therapeutic activity / neuromuscular re-education section  Answered pt's questions.   Person educated: Patient Education method: Explanation, Demonstration, Tactile cues, Verbal cues, and Handouts Education comprehension: verbalized understanding, returned demonstration, verbal cues required, tactile cues required, and needs further education     PLAN: PT FREQUENCY: 1x/week   PT DURATION: 10 weeks   PLANNED INTERVENTIONS: Therapeutic exercises, Therapeutic activity, Neuromuscular re-education, Balance training, Gait training, Patient/Family education, Self Care, Joint mobilization, Spinal mobilization, Moist heat, Taping, and Manual therapy, dry needling.   PLAN FOR NEXT SESSION: See clinical impression for plan     GOALS: Goals reviewed with patient? Yes  SHORT TERM GOALS: Target date: 06/03/2022    Pt will demo IND with HEP                    Baseline: Not IND            Goal status: INITIAL   LONG TERM GOALS: Target date: 07/15/2022    1.Pt will demo proper deep core coordination without chest breathing and optimal excursion of diaphragm/pelvic floor in order to promote spinal stability and pelvic floor function  Baseline: dyscoordination Goal status: INITIAL  2.  Pt will demo > 5 pt change on FOTO  to improve QOL and function   Pelvic Pain baseline - 17   Bowel  constipation baseline - 49 pts   Lumber baseline  -   Goal status: INITIAL  3.  Pt will demo proper body mechanics in against gravity tasks and ADLs  work tasks, fitness  to minimize straining pelvic floor / back                  Baseline: not IND, improper form that places strain on pelvic floor                Goal  status: INITIAL    4. Pt will demo less fingers width < 2 in order to improve DRA and IAP system to promote spinal / pelvic stability for pick up baby/ carrying laundry up stairs  Baseline: 3 fingers width Goal status: INITIAL    5. Pt will demo decreased C section scar restrictions in order to tolerate tight pants and progress to deep core HEP  Baseline: restricted scars  Goal status: INITIAL   6. Pt will demo proper lengthening of pelvic floor mm to enjoy sex and undergo pelvic exams with speculum at OB-GYN appts  Baseline: pain with these activities  Goal status: INITIAL    Jerl Mina, PT 06/20/2022, 10:26 AM

## 2022-07-04 ENCOUNTER — Ambulatory Visit: Payer: BC Managed Care – PPO | Attending: Obstetrics and Gynecology | Admitting: Physical Therapy

## 2022-07-23 ENCOUNTER — Other Ambulatory Visit: Payer: Self-pay | Admitting: Family

## 2022-09-02 DIAGNOSIS — S39012A Strain of muscle, fascia and tendon of lower back, initial encounter: Secondary | ICD-10-CM | POA: Diagnosis not present

## 2022-09-19 ENCOUNTER — Ambulatory Visit: Payer: BC Managed Care – PPO | Admitting: Family

## 2022-09-23 ENCOUNTER — Other Ambulatory Visit: Payer: Self-pay | Admitting: Family

## 2022-09-23 DIAGNOSIS — G4700 Insomnia, unspecified: Secondary | ICD-10-CM

## 2022-09-26 ENCOUNTER — Ambulatory Visit: Payer: BC Managed Care – PPO | Admitting: Family

## 2022-09-26 ENCOUNTER — Encounter: Payer: Self-pay | Admitting: Family

## 2022-09-26 VITALS — BP 100/64 | HR 107 | Temp 98.3°F | Ht 67.0 in | Wt 174.4 lb

## 2022-09-26 DIAGNOSIS — R635 Abnormal weight gain: Secondary | ICD-10-CM | POA: Diagnosis not present

## 2022-09-26 DIAGNOSIS — G4700 Insomnia, unspecified: Secondary | ICD-10-CM

## 2022-09-26 DIAGNOSIS — F32A Depression, unspecified: Secondary | ICD-10-CM | POA: Diagnosis not present

## 2022-09-26 DIAGNOSIS — F419 Anxiety disorder, unspecified: Secondary | ICD-10-CM

## 2022-09-26 DIAGNOSIS — F410 Panic disorder [episodic paroxysmal anxiety] without agoraphobia: Secondary | ICD-10-CM | POA: Diagnosis not present

## 2022-09-26 MED ORDER — METFORMIN HCL ER 500 MG PO TB24: 500.0000 mg | ORAL_TABLET | Freq: Every evening | ORAL | 2 refills | Status: DC

## 2022-09-26 MED ORDER — SERTRALINE HCL 50 MG PO TABS: 50.0000 mg | ORAL_TABLET | Freq: Every day | ORAL | 3 refills | Status: DC

## 2022-09-26 MED ORDER — BUPROPION HCL ER (XL) 300 MG PO TB24: 300.0000 mg | ORAL_TABLET | Freq: Every morning | ORAL | 2 refills | Status: DC

## 2022-09-26 NOTE — Assessment & Plan Note (Addendum)
Discussed low glycemic diet.  Patient willing to retrial metformin.  She previously been on metformin for PCOS.  Counseled on side effects, how to titrate.  Right side of thyroid more prominent, pending ultrasound, labs.  Discussed back pain.  Patient politely declines physical therapy referral at this time due to time constraints.  Provided patient with exercises to start with.  She will let me know how she is doing.

## 2022-09-26 NOTE — Progress Notes (Signed)
Assessment & Plan:  Weight gain Assessment & Plan: Discussed low glycemic diet.  Patient willing to retrial metformin.  She previously been on metformin for PCOS.  Counseled on side effects, how to titrate.  Right side of thyroid more prominent, pending ultrasound, labs.  Discussed back pain.  Patient politely declines physical therapy referral at this time due to time constraints.  Provided patient with exercises to start with.  She will let me know how she is doing.   Orders: -     Hemoglobin A1c -     Insulin, random -     US THYROID; Future  Anxiety and depression Assessment & Plan: Chronic, stable . Continue Zoloft 150 mg, Wellbutrin 450 mg , Lunesta 3 mg qpm  Orders: -     buPROPion HCl ER (XL); Take 1 tablet (300 mg total) by mouth every morning. Take with wellbutrin 150mg  tablet  Dispense: 90 tablet; Refill: 2 -     Comprehensive metabolic panel -     TSH -     CBC with Differential/Platelet  Panic disorder -     Sertraline HCl; Take 1 tablet (50 mg total) by mouth at bedtime.  Dispense: 90 tablet; Refill: 3  Insomnia, unspecified type  Other orders -     metFORMIN HCl ER; Take 1 tablet (500 mg total) by mouth every evening.  Dispense: 90 tablet; Refill: 2     Return precautions given.   Risks, benefits, and alternatives of the medications and treatment plan prescribed today were discussed, and patient expressed understanding.   Education regarding symptom management and diagnosis given to patient on AVS either electronically or printed.  No follow-ups on file.  Rennie Plowman, FNP  Subjective:    Patient ID: [name], female    DOB: [birth year], 32 y.o.   MRN: [id number]  CC: [name] is a 32 y.o. female who presents today for follow up.   HPI: Complains of mid low back pain for several months, onset during and after pregnancy  Comes and goes. Occasional she will posterior numbness left leg to left knee  Going up stairs can be difficult.   She  will feel spasm on occasion.  Flexeril was not helpful  Taking tylenol , ibuprofen prn with relief.   She saw [facility]  09/02/22.  She states x-rays were done.  Recommend PT. She had considered MRI lumbar spine however cost was prohibitive  No saddle anesthesia, urinary or fecal incontinence.   H/o PCOS and treated with metformin    HIV negative 12/19/21  06/25/21 NIL, negative HPV Compliant with Lunesta, Zoloft and Wellbutrin Allergies: Aspirin, Levaquin [levofloxacin in d5w], and Sulfa antibiotics Current Outpatient Medications on File Prior to Visit  Medication Sig Dispense Refill   acetaminophen (TYLENOL) 500 MG tablet Take 1 tablet (500 mg total) by mouth every 6 (six) hours as needed. 30 tablet 0   buPROPion (WELLBUTRIN XL) 150 MG 24 hr tablet TAKE 1 TABLET(150 MG) BY MOUTH EVERY MORNING 90 tablet 1   Eszopiclone 3 MG TABS TAKE 1 TABLET(3 MG) BY MOUTH AT BEDTIME 30 tablet 2   ibuprofen (ADVIL) 600 MG tablet Take 1 tablet (600 mg total) by mouth every 6 (six) hours as needed.     sertraline (ZOLOFT) 100 MG tablet Take 1 tablet (100 mg total) by mouth every evening. Take with 50mg  zoloft tablet. 90 tablet 3   No current facility-administered medications on file prior to visit.    Review of Systems  Constitutional:  Negative for chills and fever.  HENT:  Negative for trouble swallowing.   Respiratory:  Negative for cough.   Cardiovascular:  Negative for chest pain and palpitations.  Gastrointestinal:  Negative for nausea and vomiting.  Musculoskeletal:  Positive for back pain.  Neurological:  Positive for numbness.      Objective:    BP 100/64   Pulse (!) 107   Temp 98.3 F (36.8 C) (Oral)   Ht 5\' 7"  (1.702 m)   Wt 174 lb 6.4 oz (79.1 kg)   SpO2 97%   BMI 27.31 kg/m  BP Readings from Last 3 Encounters:  09/26/22 100/64  02/24/22 118/66  01/18/22 115/75   Wt Readings from Last 3 Encounters:  09/26/22 174 lb 6.4 oz (79.1 kg)  02/24/22 171 lb 3.2 oz (77.7 kg)   01/15/22 200 lb (90.7 kg)    Physical Exam Vitals reviewed.  Constitutional:      Appearance: She is well-developed.  Eyes:     Conjunctiva/sclera: Conjunctivae normal.  Neck:     Thyroid: No thyroid mass, thyromegaly or thyroid tenderness.     Comments: Right side of thyroid gland more prominent.  No palpable mass Cardiovascular:     Rate and Rhythm: Normal rate and regular rhythm.     Pulses: Normal pulses.     Heart sounds: Normal heart sounds.  Pulmonary:     Effort: Pulmonary effort is normal.     Breath sounds: Normal breath sounds. No wheezing, rhonchi or rales.  Skin:    General: Skin is warm and dry.  Neurological:     Mental Status: She is alert.  Psychiatric:        Speech: Speech normal.        Behavior: Behavior normal.        Thought Content: Thought content normal.

## 2022-09-26 NOTE — Assessment & Plan Note (Addendum)
Chronic, stable . Continue Zoloft 150 mg, Wellbutrin 450 mg , Lunesta 3 mg qpm

## 2022-09-26 NOTE — Patient Instructions (Addendum)
Metformin is used in prediabetes, diabetes, and also for weight loss by decreasing calorie consumption.   It works in a couple of ways by decreasing liver glucose production, decreases intestinal absorption of glucose and improves insulin sensitivity (increases peripheral glucose uptake and utilization).     Please make sure that you titrate per below so not to cause any GI upset.  The instructions on the metformin bottle however say metformin 1000mg  twice daily so that you do not run out of medication when you are on maximum dose but you will need to titrate to get there.    Lets trial metformin  Start metformin XR with one 500mg  tablet at night. After one week, you may increase to two tablets at night ( total of 1000mg ) . The third week, you may take take two tablets at night and one tablet in the morning.  The fourth week, you may take two tablets in the morning ( 1000mg  total) and two tablets at night (1000mg  total). This will bring you to a maximum daily dose of 2000mg /day which is maximum dose.  So you are aware,  you may take ALL 4 tablets of metformin together at the same time if preferable and doesn't cause GI upset. You may take metformin 2000mg  ( four of the 500mg  tablets) together in the morning or at night if you prefer.   Along the way, if you want to increase more slowly, please do as this medication can cause GI discomfort and loose stools which usually get better with time , however some patients find that they can only tolerate a certain dose and cannot increase to maximum dose.    Please download Myfitness Pal App ( free). You may log every thing you eat for even 2-3 days to get a better of idea of true daily calories. To loose weight, you have to use more calories than than consumed and essentially create caloric deficit to loose weight. The goal is 1-2 lbs per week of weight loss.   You review below from [facility]    [email or web address]  Calorie counting made easy  Eat less, exercise more. If only it were that simple! As most dieters know, losing weight can be very challenging. As this report details, a range of influences can affect how people gain and lose weight. But a basic understanding of how to tip your energy balance in favor of weight loss is a good place to start.  Start by determining how many calories you should consume each day. To do so, you need to know how many calories you need to maintain your current weight. Doing this requires a few simple calculations.  First, multiply your current weight by 15 -- that's roughly the number of calories per pound of body weight needed to maintain your current weight if you are moderately active. Moderately active means getting at least 30 minutes of physical activity a day in the form of exercise (walking at a brisk pace, climbing stairs, or active gardening). Let's say you're a woman who is 5 feet, 4 inches tall and weighs 155 pounds, and you need to lose about 15 pounds to put you in a healthy weight range. If you multiply 155 by 15, you will get 2,325, which is the number of calories per day that you need in order to maintain your current weight (weight-maintenance calories). To lose weight, you will need to get below that total.  For example, to lose 1 to 2 pounds a week -- a  rate that experts consider safe -- your food consumption should provide 500 to 1,000 calories less than your total weight-maintenance calories. If you need 2,325 calories a day to maintain your current weight, reduce your daily calories to between 1,325 and 1,825. If you are sedentary, you will also need to build more activity into your day. In order to lose at least a pound a week, try to do at least 30 minutes of physical activity on most days, and reduce your daily calorie intake by at least 500 calories. However, calorie intake should  not fall below 1,200 a day in women or 1,500 a day in men, except under the supervision of a health professional. Eating too few calories can endanger your health by depriving you of needed nutrients.  Meeting your calorie target How can you meet your daily calorie target? One approach is to add up the number of calories per serving of all the foods that you eat, and then plan your menus accordingly. You can buy books that list calories per serving for many foods. In addition, the nutrition labels on all packaged foods and beverages provide calories per serving information. Make a point of reading the labels of the foods and drinks you use, noting the number of calories and the serving sizes. Many recipes published in cookbooks, newspapers, and magazines provide similar information.  If you hate counting calories, a different approach is to restrict how much and how often you eat, and to eat meals that are low in calories. Dietary guidelines issued by the American Heart Association stress common sense in choosing your foods rather than focusing strictly on numbers, such as total calories or calories from fat. Whichever method you choose, research shows that a regular eating schedule -- with meals and snacks planned for certain times each day -- makes for the most successful approach. The same applies after you have lost weight and want to keep it off. Sticking with an eating schedule increases your chance of maintaining your new weight.    This is  Dr. Melina Schools  ( an amazing physician in my office!)  example of a  "Low GI"  Diet:  It will allow you to lose 4 to 8  lbs  per month if you follow it carefully.  Your goal with exercise is a minimum of 30 minutes of aerobic exercise 5 days per week (Walking does not count once it becomes easy!)    All of the foods can be found at grocery stores and in bulk at [employer].  The Atkins protein bars and shakes are available in more varieties at Target, [employer] and  Lowe's Foods.     7 AM Breakfast:  Choose from the following:  Low carbohydrate Protein  Shakes (I recommend the  Premier Protein chocolate shakes,  EAS AdvantEdge "Carb Control" shakes  Or the Atkins shakes all are under 3 net carbs)     a scrambled egg/bacon/cheese burrito made with Mission's "carb balance" whole wheat tortilla  (about 10 net carbs )  Medical laboratory scientific officer (basically a quiche without the pastry crust) that is eaten cold and very convenient way to get your eggs.  8 carbs)  If you make your own protein shakes, avoid bananas and pineapple,  And use low carb greek yogurt or original /unsweetened almond or soy milk    Avoid cereal and bananas, oatmeal and cream of wheat and grits. They are loaded with carbohydrates!   10 AM: high protein snack:  Protein bar by Atkins (the snack size, under 200 cal, usually < 6 net carbs).    A stick of cheese:  Around 1 carb,  100 cal     Dannon Light n Fit [place] Yogurt  (80 cal, 8 carbs)  Other so called "protein bars" and Greek yogurts tend to be loaded with carbohydrates.  Remember, in food advertising, the word "energy" is synonymous for " carbohydrate."  Lunch:   A Sandwich using the bread choices listed, Can use any  Eggs,  lunchmeat, grilled meat or canned tuna), avocado, regular mayo/mustard  and cheese.  A Salad using blue cheese, ranch,  Goddess or vinagrette,  Avoid taco shells, croutons or "confetti" and no "candied nuts" but regular nuts OK.   No pretzels, nabs  or chips.  Pickles and miniature sweet peppers are a good low carb alternative that provide a "crunch"  The bread is the only source of carbohydrate in a sandwich and  can be decreased by trying some of the attached alternatives to traditional loaf bread   Avoid "Low fat dressings, as well as Reyne Dumas and [employer] dressings They are loaded with sugar!   3 PM/ Mid day  Snack:  Consider  1 ounce of  almonds, walnuts, pistachios, pecans, peanuts,   Macadamia nuts or a nut medley.  Avoid "granola and granola bars "  Mixed nuts are ok in moderation as long as there are no raisins,  cranberries or dried fruit.   KIND bars are OK if you get the low glycemic index variety   Try the prosciutto/mozzarella cheese sticks by [employer]  In deli /backery section   High protein      6 PM  Dinner:     Meat/fowl/fish with a green salad, and either broccoli, cauliflower, green beans, spinach, brussel sprouts or  Lima beans. DO NOT BREAD THE PROTEIN!!      There is a low carb pasta by Dreamfield's that is acceptable and tastes great: only 5 digestible carbs/serving.( All grocery stores but BJs carry it ) Several ready made meals are available low carb:   Try Michel Angelo's chicken piccata or chicken or eggplant parm over low carb pasta.(Lowes and BJs)   Clifton Custard Sanchez's "Carnitas" (pulled pork, no sauce,  0 carbs) or his beef pot roast to make a dinner burrito (at [employer]J's)  Pesto over low carb pasta (bj's sells a good quality pesto in the center refrigerated section of the deli   Try satueeing  Roosvelt Harps with mushroooms as a good side   [employer] Giant makes a mashed cauliflower that tastes like mashed potatoes  Whole wheat pasta is still full of digestible carbs and  Not as low in glycemic index as Dreamfield's.   Brown rice is still rice,  So skip the rice and noodles if you eat [place] or [place] (or at least limit to 1/2 cup)  9 PM snack :   Breyer's "low carb" fudgsicle or  ice cream bar (Carb Smart line), or  Weight Watcher's ice cream bar , or another "no sugar added" ice cream;  a serving of fresh berries/cherries with whipped cream   Cheese or DANNON'S LlGHT N FIT GREEK YOGURT  8 ounces of [place] unsweetened almond/cococunut milk    Treat yourself to a parfait made with whipped cream blueberiies, walnuts and vanilla greek yogurt  Avoid bananas, pineapple, grapes  and watermelon on a regular basis because they are high in sugar.  THINK OF THEM AS  DESSERT  Remember that snack Substitutions should be less than 10 NET carbs per serving and meals < 20 carbs. Remember to subtract fiber grams to get the "net carbs."  Low Back Sprain or Strain Rehab Ask your health care provider which exercises are safe for you. Do exercises exactly as told by your health care provider and adjust them as directed. It is normal to feel mild stretching, pulling, tightness, or discomfort as you do these exercises. Stop right away if you feel sudden pain or your pain gets worse. Do not begin these exercises until told by your health care provider. Stretching and range-of-motion exercises These exercises warm up your muscles and joints and improve the movement and flexibility of your back. These exercises also help to relieve pain, numbness, and tingling. Lumbar rotation  Lie on your back on a firm bed or the floor with your knees bent. Straighten your arms out to your sides so each arm forms a 90-degree angle (right angle) with a side of your body. Slowly move (rotate) both of your knees to one side of your body until you feel a stretch in your lower back (lumbar). Try not to let your shoulders lift off the floor. Hold this position for __________ seconds. Tense your abdominal muscles and slowly move your knees back to the starting position. Repeat this exercise on the other side of your body. Repeat __________ times. Complete this exercise __________ times a day. Single knee to chest  Lie on your back on a firm bed or the floor with both legs straight. Bend one of your knees. Use your hands to move your knee up toward your chest until you feel a gentle stretch in your lower back and buttock. Hold your leg in this position by holding on to the front of your knee. Keep your other leg as straight as possible. Hold this position for __________ seconds. Slowly return to the starting position. Repeat with your other leg. Repeat __________ times. Complete this  exercise __________ times a day. Prone extension on elbows  Lie on your abdomen on a firm bed or the floor (prone position). Prop yourself up on your elbows. Use your arms to help lift your chest up until you feel a gentle stretch in your abdomen and your lower back. This will place some of your body weight on your elbows. If this is uncomfortable, try stacking pillows under your chest. Your hips should stay down, against the surface that you are lying on. Keep your hip and back muscles relaxed. Hold this position for __________ seconds. Slowly relax your upper body and return to the starting position. Repeat __________ times. Complete this exercise __________ times a day. Strengthening exercises These exercises build strength and endurance in your back. Endurance is the ability to use your muscles for a long time, even after they get tired. Pelvic tilt This exercise strengthens the muscles that lie deep in the abdomen. Lie on your back on a firm bed or the floor with your legs extended. Bend your knees so they are pointing toward the ceiling and your feet are flat on the floor. Tighten your lower abdominal muscles to press your lower back against the floor. This motion will tilt your pelvis so your tailbone points up toward the ceiling instead of pointing to your feet or the floor. To help with this exercise, you may place a small towel under your lower back and try to push your back into the towel. Hold this position for __________ seconds. Let  your muscles relax completely before you repeat this exercise. Repeat __________ times. Complete this exercise __________ times a day. Alternating arm and leg raises  Get on your hands and knees on a firm surface. If you are on a hard floor, you may want to use padding, such as an exercise mat, to cushion your knees. Line up your arms and legs. Your hands should be directly below your shoulders, and your knees should be directly below your  hips. Lift your left leg behind you. At the same time, raise your right arm and straighten it in front of you. Do not lift your leg higher than your hip. Do not lift your arm higher than your shoulder. Keep your abdominal and back muscles tight. Keep your hips facing the ground. Do not arch your back. Keep your balance carefully, and do not hold your breath. Hold this position for __________ seconds. Slowly return to the starting position. Repeat with your right leg and your left arm. Repeat __________ times. Complete this exercise __________ times a day. Abdominal set with straight leg raise  Lie on your back on a firm bed or the floor. Bend one of your knees and keep your other leg straight. Tense your abdominal muscles and lift your straight leg up, 4-6 inches (10-15 cm) off the ground. Keep your abdominal muscles tight and hold this position for __________ seconds. Do not hold your breath. Do not arch your back. Keep it flat against the ground. Keep your abdominal muscles tense as you slowly lower your leg back to the starting position. Repeat with your other leg. Repeat __________ times. Complete this exercise __________ times a day. Single leg lower with bent knees Lie on your back on a firm bed or the floor. Tense your abdominal muscles and lift your feet off the floor, one foot at a time, so your knees and hips are bent in 90-degree angles (right angles). Your knees should be over your hips and your lower legs should be parallel to the floor. Keeping your abdominal muscles tense and your knee bent, slowly lower one of your legs so your toe touches the ground. Lift your leg back up to return to the starting position. Do not hold your breath. Do not let your back arch. Keep your back flat against the ground. Repeat with your other leg. Repeat __________ times. Complete this exercise __________ times a day. Posture and body mechanics Good posture and healthy body mechanics can  help to relieve stress in your body's tissues and joints. Body mechanics refers to the movements and positions of your body while you do your daily activities. Posture is part of body mechanics. Good posture means: Your spine is in its natural S-curve position (neutral). Your shoulders are pulled back slightly. Your head is not tipped forward (neutral). Follow these guidelines to improve your posture and body mechanics in your everyday activities. Standing  When standing, keep your spine neutral and your feet about hip-width apart. Keep a slight bend in your knees. Your ears, shoulders, and hips should line up. When you do a task in which you stand in one place for a long time, place one foot up on a stable object that is 2-4 inches (5-10 cm) high, such as a footstool. This helps keep your spine neutral. Sitting  When sitting, keep your spine neutral and keep your feet flat on the floor. Use a footrest, if necessary, and keep your thighs parallel to the floor. Avoid rounding your shoulders, and avoid  tilting your head forward. When working at a desk or a computer, keep your desk at a height where your hands are slightly lower than your elbows. Slide your chair under your desk so you are close enough to maintain good posture. When working at a computer, place your monitor at a height where you are looking straight ahead and you do not have to tilt your head forward or downward to look at the screen. Resting When lying down and resting, avoid positions that are most painful for you. If you have pain with activities such as sitting, bending, stooping, or squatting, lie in a position in which your body does not bend very much. For example, avoid curling up on your side with your arms and knees near your chest (fetal position). If you have pain with activities such as standing for a long time or reaching with your arms, lie with your spine in a neutral position and bend your knees slightly. Try the  following positions: Lying on your side with a pillow between your knees. Lying on your back with a pillow under your knees. Lifting  When lifting objects, keep your feet at least shoulder-width apart and tighten your abdominal muscles. Bend your knees and hips and keep your spine neutral. It is important to lift using the strength of your legs, not your back. Do not lock your knees straight out. Always ask for help to lift heavy or awkward objects. This information is not intended to replace advice given to you by your health care provider. Make sure you discuss any questions you have with your health care provider. Document Revised: 07/02/2020 Document Reviewed: 07/02/2020 Elsevier Patient Education  2024 [employer].

## 2022-09-27 LAB — HEMOGLOBIN A1C: Hgb A1c MFr Bld: 5.4 % of total Hgb (ref <5.7)

## 2022-09-29 LAB — CBC WITH DIFFERENTIAL/PLATELET
Absolute Monocytes: 665 cells/uL (ref 200–950)
Basophils Absolute: 49 cells/uL (ref 0–200)
Basophils Relative: 0.7 %
Eosinophils Absolute: 91 cells/uL (ref 15–500)
Eosinophils Relative: 1.3 %
HCT: 41.4 % (ref 35.0–45.0)
Hemoglobin: 14.1 g/dL (ref 11.7–15.5)
Lymphs Abs: 2093 cells/uL (ref 850–3900)
MCH: 31.7 pg (ref 27.0–33.0)
MCHC: 34.1 g/dL (ref 32.0–36.0)
MCV: 93 fL (ref 80.0–100.0)
MPV: 9.2 fL (ref 7.5–12.5)
Monocytes Relative: 9.5 %
Neutro Abs: 4102 cells/uL (ref 1500–7800)
Neutrophils Relative %: 58.6 %
Platelets: 343 10*3/uL (ref 140–400)
RBC: 4.45 10*6/uL (ref 3.80–5.10)
RDW: 12.5 % (ref 11.0–15.0)
Total Lymphocyte: 29.9 %
WBC: 7 10*3/uL (ref 3.8–10.8)

## 2022-09-29 LAB — COMPREHENSIVE METABOLIC PANEL
AG Ratio: 1.8 (calc) (ref 1.0–2.5)
ALT: 20 U/L (ref 6–29)
AST: 13 U/L (ref 10–30)
Albumin: 4.2 g/dL (ref 3.6–5.1)
Alkaline phosphatase (APISO): 73 U/L (ref 31–125)
BUN: 10 mg/dL (ref 7–25)
CO2: 25 mmol/L (ref 20–32)
Calcium: 9.4 mg/dL (ref 8.6–10.2)
Chloride: 104 mmol/L (ref 98–110)
Creat: 0.88 mg/dL (ref 0.50–0.97)
Globulin: 2.3 g/dL (calc) (ref 1.9–3.7)
Glucose, Bld: 100 mg/dL — ABNORMAL HIGH (ref 65–99)
Potassium: 3.8 mmol/L (ref 3.5–5.3)
Sodium: 140 mmol/L (ref 135–146)
Total Bilirubin: 0.4 mg/dL (ref 0.2–1.2)
Total Protein: 6.5 g/dL (ref 6.1–8.1)

## 2022-09-29 LAB — HEMOGLOBIN A1C
Mean Plasma Glucose: 108 mg/dL
eAG (mmol/L): 6 mmol/L

## 2022-09-29 LAB — INSULIN, RANDOM: Insulin: 18.1 u[IU]/mL

## 2022-09-29 LAB — TSH: TSH: 0.93 mIU/L

## 2022-10-10 ENCOUNTER — Ambulatory Visit: Payer: BC Managed Care – PPO

## 2022-11-28 ENCOUNTER — Telehealth: Payer: Self-pay

## 2022-11-28 NOTE — Telephone Encounter (Signed)
[name], FNP  [facility] Can we get [facility] notes and xray from 09/02/22?   E fax has been sent requesting notes at [phone]

## 2022-12-04 DIAGNOSIS — H1089 Other conjunctivitis: Secondary | ICD-10-CM | POA: Diagnosis not present

## 2022-12-04 DIAGNOSIS — J069 Acute upper respiratory infection, unspecified: Secondary | ICD-10-CM | POA: Diagnosis not present

## 2023-01-05 ENCOUNTER — Other Ambulatory Visit: Payer: Self-pay | Admitting: Family

## 2023-01-05 DIAGNOSIS — F419 Anxiety disorder, unspecified: Secondary | ICD-10-CM

## 2023-01-05 DIAGNOSIS — G4700 Insomnia, unspecified: Secondary | ICD-10-CM

## 2023-01-06 NOTE — Telephone Encounter (Signed)
Was unable to lvm vm is full

## 2023-01-08 ENCOUNTER — Encounter: Payer: Self-pay | Admitting: *Deleted

## 2023-02-03 DIAGNOSIS — R051 Acute cough: Secondary | ICD-10-CM | POA: Diagnosis not present

## 2023-02-03 DIAGNOSIS — H66003 Acute suppurative otitis media without spontaneous rupture of ear drum, bilateral: Secondary | ICD-10-CM | POA: Diagnosis not present

## 2023-02-03 DIAGNOSIS — J029 Acute pharyngitis, unspecified: Secondary | ICD-10-CM | POA: Diagnosis not present

## 2023-02-03 DIAGNOSIS — Z6826 Body mass index (BMI) 26.0-26.9, adult: Secondary | ICD-10-CM | POA: Diagnosis not present

## 2023-02-11 DIAGNOSIS — Z6826 Body mass index (BMI) 26.0-26.9, adult: Secondary | ICD-10-CM | POA: Diagnosis not present

## 2023-02-11 DIAGNOSIS — R051 Acute cough: Secondary | ICD-10-CM | POA: Diagnosis not present

## 2023-03-09 ENCOUNTER — Ambulatory Visit: Payer: BC Managed Care – PPO | Admitting: Family

## 2023-03-09 ENCOUNTER — Encounter: Payer: Self-pay | Admitting: Family

## 2023-03-09 VITALS — BP 108/68 | HR 95 | Temp 97.9°F | Ht 67.0 in | Wt 173.4 lb

## 2023-03-09 DIAGNOSIS — F32A Depression, unspecified: Secondary | ICD-10-CM | POA: Diagnosis not present

## 2023-03-09 DIAGNOSIS — F419 Anxiety disorder, unspecified: Secondary | ICD-10-CM | POA: Diagnosis not present

## 2023-03-09 DIAGNOSIS — R635 Abnormal weight gain: Secondary | ICD-10-CM | POA: Diagnosis not present

## 2023-03-09 DIAGNOSIS — Z8709 Personal history of other diseases of the respiratory system: Secondary | ICD-10-CM | POA: Diagnosis not present

## 2023-03-09 MED ORDER — ALBUTEROL SULFATE HFA 108 (90 BASE) MCG/ACT IN AERS
2.0000 | INHALATION_SPRAY | Freq: Four times a day (QID) | RESPIRATORY_TRACT | 0 refills | Status: DC | PRN
Start: 2023-03-09 — End: 2023-08-21

## 2023-03-09 MED ORDER — BUDESONIDE-FORMOTEROL FUMARATE 80-4.5 MCG/ACT IN AERO
2.0000 | INHALATION_SPRAY | Freq: Two times a day (BID) | RESPIRATORY_TRACT | 1 refills | Status: AC
Start: 2023-03-09 — End: ?

## 2023-03-09 NOTE — Assessment & Plan Note (Addendum)
Chronic, suboptimal control.  Primarily she is concerned with her weight.  She declines making any medication adjustments to Wellbutrin 450mg  or Zoloft 150mg .

## 2023-03-09 NOTE — Progress Notes (Unsigned)
Assessment & Plan:  Weight gain Assessment & Plan: Discussed conservative therapy and we agreed nutritionist referral appropriate.   Orders: -     Amb ref to Medical Nutrition Therapy-MNT  Anxiety and depression Assessment & Plan: Chronic, suboptimal control.  Primarily she is concerned with her weight.  She declines making any medication adjustments to Wellbutrin 450mg  or Zoloft 150mg .    History of asthma Assessment & Plan: Reported history of asthma. Benign exam.  I did provide patient with albuterol and Symbicort and we discussed using with persistent URI symptoms.  Anticipate for her to be able to use as needed.  If symptoms persist or do not respond to therapy, we will arrange pulmonology consult for formal evaluation.   Orders: -     Budesonide-Formoterol Fumarate; Inhale 2 puffs into the lungs 2 (two) times daily.  Dispense: 1 each; Refill: 1 -     Albuterol Sulfate HFA; Inhale 2 puffs into the lungs every 6 (six) hours as needed for wheezing or shortness of breath.  Dispense: 8 g; Refill: 0     Return precautions given.   Risks, benefits, and alternatives of the medications and treatment plan prescribed today were discussed, and patient expressed understanding.   Education regarding symptom management and diagnosis given to patient on AVS either electronically or printed.  Return in about 6 months (around 09/06/2023).  Rennie Plowman, FNP  Subjective:    Patient ID: Ulysees Barns, female    DOB: June 23, 1990, 32 y.o.   MRN: [id number]  CC: WILLINE SPRAGG is a 32 y.o. female who presents today for follow up.   HPI: She is frustrating by weight.  Metformin gave her GI discomfort.  No weight loss on metformin. Interested in seeing nutritionist.  No personal or family history of thyroid cancer, multiple endocrine neoplasia. She also reports she has a history of childhood asthma.  She has a young son at daycare.  She frequently gets a cough and symptoms seem to last  longer for her.  Denies fever, cough, wheezing or shortness of breath at this time.  Previously she had been on inhaler as a child.   Metformin was not helpful.  Compliant with  Zoloft 150 mg, Wellbutrin 450 mg , Lunesta 3 mg qpm   Insulin 18.1 5 months ago  Allergies: Aspirin, Levaquin [levofloxacin in d5w], Metformin and related, and Sulfa antibiotics Current Outpatient Medications on File Prior to Visit  Medication Sig Dispense Refill   acetaminophen (TYLENOL) 500 MG tablet Take 1 tablet (500 mg total) by mouth every 6 (six) hours as needed. 30 tablet 0   buPROPion (WELLBUTRIN XL) 150 MG 24 hr tablet TAKE 1 TABLET(150 MG) BY MOUTH EVERY MORNING 90 tablet 1   buPROPion (WELLBUTRIN XL) 300 MG 24 hr tablet Take 1 tablet (300 mg total) by mouth every morning. Take with wellbutrin 150mg  tablet 90 tablet 2   Eszopiclone 3 MG TABS TAKE 1 TABLET(3 MG) BY MOUTH AT BEDTIME 30 tablet 2   ibuprofen (ADVIL) 600 MG tablet Take 1 tablet (600 mg total) by mouth every 6 (six) hours as needed.     sertraline (ZOLOFT) 100 MG tablet TAKE 1 TABLET BY MOUTH EVERY EVENING. TAKE WITH 50MG  ZOLOFT. 90 tablet 3   sertraline (ZOLOFT) 50 MG tablet Take 1 tablet (50 mg total) by mouth at bedtime. 90 tablet 3   No current facility-administered medications on file prior to visit.    Review of Systems  Constitutional:  Negative for chills and  fever.  Respiratory:  Negative for cough (resolved) and shortness of breath.   Cardiovascular:  Negative for chest pain and palpitations.  Gastrointestinal:  Negative for nausea and vomiting.      Objective:    BP 108/68   Pulse 95   Temp 97.9 F (36.6 C) (Oral)   Ht 5\' 7"  (1.702 m)   Wt 173 lb 6.4 oz (78.7 kg)   LMP  (LMP Unknown)   SpO2 97%   BMI 27.16 kg/m  BP Readings from Last 3 Encounters:  03/09/23 108/68  09/26/22 100/64  02/24/22 118/66   Wt Readings from Last 3 Encounters:  03/09/23 173 lb 6.4 oz (78.7 kg)  09/26/22 174 lb 6.4 oz (79.1 kg)   02/24/22 171 lb 3.2 oz (77.7 kg)      03/09/2023   11:04 AM 09/26/2022    2:01 PM 02/24/2022    2:49 PM  Depression screen PHQ 2/9  Decreased Interest 1 0 1  Down, Depressed, Hopeless 1 0 1  PHQ - 2 Score 2 0 2  Altered sleeping 1 1 2   Tired, decreased energy 1 2 2   Change in appetite 3 0 3  Feeling bad or failure about yourself  0 0 0  Trouble concentrating 1 1 1   Moving slowly or fidgety/restless 0 0 0  Suicidal thoughts 0 0 0  PHQ-9 Score 8 4 10   Difficult doing work/chores Somewhat difficult Somewhat difficult Somewhat difficult    Physical Exam Vitals reviewed.  Constitutional:      Appearance: She is well-developed.  Eyes:     Conjunctiva/sclera: Conjunctivae normal.  Cardiovascular:     Rate and Rhythm: Normal rate and regular rhythm.     Pulses: Normal pulses.     Heart sounds: Normal heart sounds.  Pulmonary:     Effort: Pulmonary effort is normal.     Breath sounds: Normal breath sounds. No wheezing, rhonchi or rales.  Skin:    General: Skin is warm and dry.  Neurological:     Mental Status: She is alert.  Psychiatric:        Speech: Speech normal.        Behavior: Behavior normal.        Thought Content: Thought content normal.

## 2023-03-09 NOTE — Patient Instructions (Signed)
I provided you with albuterol inhaler which is a rescue inhaler for wheezing shortness of breath, long coughing spells. I also provided you with Symbicort which has a low-dose of steroid to use for prolonged cough.  Rinse your mouth out after use.   please let me know how you are doing in this regard.    Referral to nutritionist as well.

## 2023-03-10 NOTE — Assessment & Plan Note (Signed)
Discussed conservative therapy and we agreed nutritionist referral appropriate.

## 2023-03-10 NOTE — Assessment & Plan Note (Signed)
Reported history of asthma. Benign exam.  I did provide patient with albuterol and Symbicort and we discussed using with persistent URI symptoms.  Anticipate for her to be able to use as needed.  If symptoms persist or do not respond to therapy, we will arrange pulmonology consult for formal evaluation.

## 2023-03-11 ENCOUNTER — Other Ambulatory Visit: Payer: Self-pay | Admitting: Family

## 2023-04-05 ENCOUNTER — Other Ambulatory Visit: Payer: Self-pay | Admitting: Family

## 2023-04-05 DIAGNOSIS — G4700 Insomnia, unspecified: Secondary | ICD-10-CM

## 2023-04-27 ENCOUNTER — Encounter: Payer: Self-pay | Admitting: Dietician

## 2023-04-27 ENCOUNTER — Encounter: Payer: Self-pay | Attending: Family | Admitting: Dietician

## 2023-04-27 VITALS — Ht 67.0 in | Wt 172.8 lb

## 2023-04-27 DIAGNOSIS — Z6827 Body mass index (BMI) 27.0-27.9, adult: Secondary | ICD-10-CM | POA: Insufficient documentation

## 2023-04-27 DIAGNOSIS — E7800 Pure hypercholesterolemia, unspecified: Secondary | ICD-10-CM

## 2023-04-27 DIAGNOSIS — R635 Abnormal weight gain: Secondary | ICD-10-CM | POA: Insufficient documentation

## 2023-04-27 NOTE — Patient Instructions (Signed)
Add low carb veggies in with meals. Plan for each meal to have at least 1 veggie or fruit, or both. Plan to eat a lunch meal daily; pre-prep the night before, or at least weekly.  Begin some walking/ active indoor activity and/or play time with family. Start with short duration and gradually increase as able.

## 2023-04-27 NOTE — Progress Notes (Signed)
Medical Nutrition Therapy: Visit start time: 1030  end time: 1130  Assessment:   Referral Diagnosis: weight gain after pregnancy Other medical history/ diagnoses: hyperlipidemia Psychosocial issues/ stress concerns: history of anxiety, depression, taking medications to treat  Medications, supplements: reconciled list in medical record   Current weight: 172.8lbs Height: 5'7" BMI: 27.06 Patient's personal weight goal: 130-135lbs  Progress and evaluation:  Reports about 2 periods of rapid weight loss since teen years, once due to abdominal pain with red meat. Stopped the red meat and lost down to 125lbs. Then became pregnant and gained 70lbs during pregnancy. She has had difficulty losing weight since then. Patient reports lack of knowledge regarding nutrition and meal planning.  Works full time and has toddler in the home, so time for cooking is limited, needs simple and quick meals Available lab results indicate elevated cholesterol  08/2020 total cholesterol 206, HDL 56.5, LDL 135, Triglycerides 75; HbA1C  5.4 09/26/22. Food allergies: broccoli; history of GI distress after eating fried foods; red meat -- currently resolved Special diet practices: lactos free; husband is intolerant Patient seeks help with weight management and healthier eating due to family history of multiple chronic health issues    Dietary Intake:  Usual eating pattern includes 1-3 meals and 2-3 snacks per day. Dining out frequency: 6-8 meals per week. Who plans meals/ buys groceries? self Who prepares meals? self  Breakfast: when off/ weekends -- toast/ waffles/ pancakes/ oatmeal/ grits; most weekdays coffee only due to lack of time Snack: pkg cheese or peanut butter crackers; fruit leather; veggie straw chips; nuts; Kind bars Lunch: often skips during week; sandwich/ Ramen/ fast food on weekends Snack: same as am Supper: premade meals from grocery store (meat loaf and potatoes; lasagna; similar) fast foods/ quick,  easy meals + dessert -- ice cream/ cookies/ honey bun Snack: (dessert) Beverages: coffee throughout day with coffeemate creamer  Physical activity: ADLs -- caring for toddler; planning to increase walking   Intervention:   Nutrition Care Education:   Basic nutrition: basic food groups; appropriate nutrient balance; appropriate meal and snack schedule; general nutrition guidelines    Weight control: identifying healthy weight; determining reasonable weight loss rate; importance of low sugar and low fat choices; healthy food portions; estimated energy needs for weight loss at 1400 kcal, provided guidance for 45% CHO, 25% pro, and 30% fat; discussed importance of healthy balance of macronutrients, sustainable eating pattern; role of exercise Advanced nutrition:  cooking techniques-- pre-prep and portioning of meals/ snacks; food label reading Hyperlipidemia: healthy and unhealthy fats; role of fiber Other:  encouraged inclusion of fiber containing foods to prevent constipation  Other intervention notes: Patient voices understanding of balanced meals, and reports readiness for change. Established specific goals with direction from patient.   Nutritional Diagnosis:  [place]-3.4 Unintentional weight gain As related to excess calories, inadequate physical activity.  As evidenced by patient reported weight gain with current BMI at 27.   Education Materials given:  Designer, industrial/product with food lists, sample meal pattern Sample menus Snacking handout Visit summary with goals/ instructions to be viewed via patient portal   Learner/ who was taught:  Patient   Level of understanding: Verbalizes/ demonstrates competency  Demonstrated degree of understanding via:   Teach back Learning barriers: None  Willingness to learn/ readiness for change: Acceptance, ready for change  Monitoring and Evaluation:  Dietary intake, exercise, and body weight      follow up:  06/08/23 at 11:00am

## 2023-06-08 ENCOUNTER — Ambulatory Visit: Payer: Self-pay | Admitting: Dietician

## 2023-06-30 DIAGNOSIS — R110 Nausea: Secondary | ICD-10-CM | POA: Diagnosis not present

## 2023-06-30 DIAGNOSIS — R197 Diarrhea, unspecified: Secondary | ICD-10-CM | POA: Diagnosis not present

## 2023-07-16 ENCOUNTER — Encounter: Payer: Self-pay | Admitting: Dietician

## 2023-07-16 NOTE — Progress Notes (Signed)
 Patient cancelled her appt for 06/08/23 and has not yet rescheduled. Sent notification to referring provider.

## 2023-07-26 ENCOUNTER — Other Ambulatory Visit: Payer: Self-pay | Admitting: Family

## 2023-07-26 DIAGNOSIS — G4700 Insomnia, unspecified: Secondary | ICD-10-CM

## 2023-08-21 ENCOUNTER — Encounter: Payer: Self-pay | Admitting: Family

## 2023-08-21 ENCOUNTER — Ambulatory Visit (INDEPENDENT_AMBULATORY_CARE_PROVIDER_SITE_OTHER): Payer: Self-pay | Admitting: Family

## 2023-08-21 VITALS — BP 112/70 | HR 76 | Temp 98.3°F | Ht 67.0 in | Wt 174.8 lb

## 2023-08-21 DIAGNOSIS — H9193 Unspecified hearing loss, bilateral: Secondary | ICD-10-CM | POA: Diagnosis not present

## 2023-08-21 DIAGNOSIS — Z8709 Personal history of other diseases of the respiratory system: Secondary | ICD-10-CM

## 2023-08-21 DIAGNOSIS — H9190 Unspecified hearing loss, unspecified ear: Secondary | ICD-10-CM | POA: Insufficient documentation

## 2023-08-21 DIAGNOSIS — F32A Depression, unspecified: Secondary | ICD-10-CM | POA: Diagnosis not present

## 2023-08-21 DIAGNOSIS — G4700 Insomnia, unspecified: Secondary | ICD-10-CM

## 2023-08-21 DIAGNOSIS — F410 Panic disorder [episodic paroxysmal anxiety] without agoraphobia: Secondary | ICD-10-CM | POA: Diagnosis not present

## 2023-08-21 DIAGNOSIS — F419 Anxiety disorder, unspecified: Secondary | ICD-10-CM | POA: Diagnosis not present

## 2023-08-21 LAB — CBC WITH DIFFERENTIAL/PLATELET
Basophils Absolute: 0 10*3/uL (ref 0.0–0.1)
Basophils Relative: 0.5 % (ref 0.0–3.0)
Eosinophils Absolute: 0.1 10*3/uL (ref 0.0–0.7)
Eosinophils Relative: 1.3 % (ref 0.0–5.0)
HCT: 41.1 % (ref 36.0–46.0)
Hemoglobin: 13.8 g/dL (ref 12.0–15.0)
Lymphocytes Relative: 32.3 % (ref 12.0–46.0)
Lymphs Abs: 2.4 10*3/uL (ref 0.7–4.0)
MCHC: 33.7 g/dL (ref 30.0–36.0)
MCV: 93.9 fl (ref 78.0–100.0)
Monocytes Absolute: 0.6 10*3/uL (ref 0.1–1.0)
Monocytes Relative: 8.2 % (ref 3.0–12.0)
Neutro Abs: 4.3 10*3/uL (ref 1.4–7.7)
Neutrophils Relative %: 57.7 % (ref 43.0–77.0)
Platelets: 329 10*3/uL (ref 150.0–400.0)
RBC: 4.38 Mil/uL (ref 3.87–5.11)
RDW: 14.8 % (ref 11.5–15.5)
WBC: 7.4 10*3/uL (ref 4.0–10.5)

## 2023-08-21 LAB — COMPREHENSIVE METABOLIC PANEL WITH GFR
ALT: 12 U/L (ref 0–35)
AST: 11 U/L (ref 0–37)
Albumin: 4.3 g/dL (ref 3.5–5.2)
Alkaline Phosphatase: 67 U/L (ref 39–117)
BUN: 8 mg/dL (ref 6–23)
CO2: 28 meq/L (ref 19–32)
Calcium: 9.2 mg/dL (ref 8.4–10.5)
Chloride: 104 meq/L (ref 96–112)
Creatinine, Ser: 0.72 mg/dL (ref 0.40–1.20)
GFR: 108.71 mL/min (ref >60.00)
Glucose, Bld: 86 mg/dL (ref 70–99)
Potassium: 4 meq/L (ref 3.5–5.1)
Sodium: 139 meq/L (ref 135–145)
Total Bilirubin: 0.3 mg/dL (ref 0.2–1.2)
Total Protein: 6.6 g/dL (ref 6.0–8.3)

## 2023-08-21 LAB — HEMOGLOBIN A1C: Hgb A1c MFr Bld: 5.4 % (ref 4.6–6.5)

## 2023-08-21 LAB — LIPID PANEL
Cholesterol: 255 mg/dL — ABNORMAL HIGH (ref 0–200)
HDL: 50.7 mg/dL (ref >39.00)
LDL Cholesterol: 179 mg/dL — ABNORMAL HIGH (ref 0–99)
NonHDL: 204.58
Total CHOL/HDL Ratio: 5
Triglycerides: 130 mg/dL (ref 0.0–149.0)
VLDL: 26 mg/dL (ref 0.0–40.0)

## 2023-08-21 LAB — TSH: TSH: 0.97 u[IU]/mL (ref 0.35–5.50)

## 2023-08-21 LAB — VITAMIN D 25 HYDROXY (VIT D DEFICIENCY, FRACTURES): VITD: 33.68 ng/mL (ref 30.00–100.00)

## 2023-08-21 MED ORDER — SERTRALINE HCL 50 MG PO TABS: 50.0000 mg | ORAL_TABLET | Freq: Every day | ORAL | 3 refills | Status: DC

## 2023-08-21 MED ORDER — ALBUTEROL SULFATE HFA 108 (90 BASE) MCG/ACT IN AERS: 2.0000 | INHALATION_SPRAY | Freq: Four times a day (QID) | RESPIRATORY_TRACT | 0 refills | Status: AC | PRN

## 2023-08-21 MED ORDER — BUPROPION HCL ER (XL) 150 MG PO TB24: ORAL_TABLET | ORAL | 3 refills | Status: DC

## 2023-08-21 MED ORDER — ESZOPICLONE 3 MG PO TABS: 3.0000 mg | ORAL_TABLET | Freq: Every day | ORAL | 2 refills | Status: DC

## 2023-08-21 MED ORDER — SERTRALINE HCL 100 MG PO TABS: 100.0000 mg | ORAL_TABLET | Freq: Every day | ORAL | 3 refills | Status: DC

## 2023-08-21 NOTE — Progress Notes (Signed)
 Assessment & Plan:  Anxiety and depression Assessment & Plan: Chronic, stable. Continue Wellbutrin  450mg  or Zoloft  150mg .   Orders: -     VITAMIN D  25 Hydroxy (Vit-D Deficiency, Fractures) -     Hemoglobin A1c -     CBC with Differential/Platelet -     Comprehensive metabolic panel with GFR -     Lipid panel -     TSH -     buPROPion  HCl ER (XL); Take one  (150 mg ) tablet by mouth every morning  Dispense: 90 tablet; Refill: 3 -     Sertraline  HCl; Take 1 tablet (100 mg total) by mouth at bedtime.  Dispense: 90 tablet; Refill: 3  History of asthma -     Albuterol  Sulfate HFA; Inhale 2 puffs into the lungs every 6 (six) hours as needed for wheezing or shortness of breath.  Dispense: 8 g; Refill: 0  Bilateral hearing loss, unspecified hearing loss type Assessment & Plan: Referral placed to ENT  Orders: -     Ambulatory referral to ENT  Panic disorder -     Sertraline  HCl; Take 1 tablet (50 mg total) by mouth at bedtime.  Dispense: 90 tablet; Refill: 3  Insomnia, unspecified type -     Eszopiclone ; Take 1 tablet (3 mg total) by mouth at bedtime. Take immediately before bedtime  Dispense: 30 tablet; Refill: 2     Return precautions given.   Risks, benefits, and alternatives of the medications and treatment plan prescribed today were discussed, and patient expressed understanding.   Education regarding symptom management and diagnosis given to patient on AVS either electronically or printed.  No follow-ups on file.  Bascom Bossier, FNP  Subjective:    Patient ID: [name], female    DOB: Apr 01, 1991, 33 y.o.   MRN: [id number]  CC: [name] is a 33 y.o. female who presents today for follow up.   HPI: She has been promoted at work.  She feels regimen is working well ; compliant with Wellbutrin  450mg  , Zoloft  150mg   She is concerned with BL hearing  loss.  Very occasional tinnitus.  No known hearing damage No ear pain, sinus congestion    Allergies:  Aspirin, Broccoli [brassica oleracea], Levaquin [levofloxacin in d5w], Metformin  and related, and Sulfa  antibiotics Current Outpatient Medications on File Prior to Visit  Medication Sig Dispense Refill   B Complex-C (SUPER B COMPLEX PO) Take by mouth.     buPROPion  (WELLBUTRIN  XL) 300 MG 24 hr tablet Take 1 tablet (300 mg total) by mouth every morning. Take with wellbutrin  150mg  tablet 90 tablet 2   Cholecalciferol (VITAMIN D3) 125 MCG (5000 UT) CAPS Take by mouth.     ibuprofen  (ADVIL ) 600 MG tablet Take 1 tablet (600 mg total) by mouth every 6 (six) hours as needed.     Magnesium 200 MG TABS Take by mouth.     budesonide -formoterol  (SYMBICORT ) 80-4.5 MCG/ACT inhaler Inhale 2 puffs into the lungs 2 (two) times daily. (Patient not taking: Reported on 08/21/2023) 1 each 1   No current facility-administered medications on file prior to visit.    Review of Systems  Constitutional:  Negative for chills and fever.  HENT:  Positive for hearing loss. Negative for postnasal drip, rhinorrhea and sinus pressure.   Respiratory:  Negative for cough.   Cardiovascular:  Negative for chest pain and palpitations.  Gastrointestinal:  Negative for nausea and vomiting.      Objective:    BP  112/70   Pulse 76   Temp 98.3 F (36.8 C) (Oral)   Ht 5\' 7"  (1.702 m)   Wt 174 lb 12.8 oz (79.3 kg)   LMP 08/21/2023 (Exact Date)   SpO2 99%   BMI 27.38 kg/m  BP Readings from Last 3 Encounters:  08/21/23 112/70  03/09/23 108/68  09/26/22 100/64   Wt Readings from Last 3 Encounters:  08/21/23 174 lb 12.8 oz (79.3 kg)  04/27/23 172 lb 12.8 oz (78.4 kg)  03/09/23 173 lb 6.4 oz (78.7 kg)      08/21/2023   10:28 AM 04/27/2023   10:51 AM 03/09/2023   11:04 AM  Depression screen PHQ 2/9  Decreased Interest 0 0 1  Down, Depressed, Hopeless 0 0 1  PHQ - 2 Score 0 0 2  Altered sleeping   1  Tired, decreased energy   1  Change in appetite   3  Feeling bad or failure about yourself    0  Trouble  concentrating   1  Moving slowly or fidgety/restless   0  Suicidal thoughts   0  PHQ-9 Score   8  Difficult doing work/chores   Somewhat difficult     Physical Exam Vitals reviewed.  Constitutional:      Appearance: She is well-developed.  HENT:     Ears:     Comments: Able to hear finger rub test bilaterally.  Eyes:     Conjunctiva/sclera: Conjunctivae normal.  Cardiovascular:     Rate and Rhythm: Normal rate and regular rhythm.     Pulses: Normal pulses.     Heart sounds: Normal heart sounds.  Pulmonary:     Effort: Pulmonary effort is normal.     Breath sounds: Normal breath sounds. No wheezing, rhonchi or rales.  Skin:    General: Skin is warm and dry.  Neurological:     Mental Status: She is alert.  Psychiatric:        Speech: Speech normal.        Behavior: Behavior normal.        Thought Content: Thought content normal.

## 2023-08-22 ENCOUNTER — Encounter: Payer: Self-pay | Admitting: Family

## 2023-08-22 ENCOUNTER — Other Ambulatory Visit: Payer: Self-pay | Admitting: Family

## 2023-08-27 NOTE — Assessment & Plan Note (Signed)
 Chronic, stable. Continue Wellbutrin  450mg  or Zoloft  150mg .

## 2023-08-27 NOTE — Assessment & Plan Note (Signed)
 Referral placed to ENT

## 2023-11-06 ENCOUNTER — Ambulatory Visit: Admitting: Family

## 2023-11-06 ENCOUNTER — Encounter: Payer: Self-pay | Admitting: Family

## 2023-11-06 VITALS — BP 116/70 | HR 70 | Temp 98.0°F | Ht 67.0 in | Wt 176.6 lb

## 2023-11-06 DIAGNOSIS — R635 Abnormal weight gain: Secondary | ICD-10-CM

## 2023-11-06 DIAGNOSIS — E282 Polycystic ovarian syndrome: Secondary | ICD-10-CM

## 2023-11-06 NOTE — Assessment & Plan Note (Signed)
 Discussed constellation of symptoms and no straightforward etiology at this time.  Agree that extensive vitamin and hormone evaluation is appropriate next step.  She would be interested in medication for weight loss after labs. We discussed constipation and my trepidation with starting GLP-1 agonist, if that is about we proceed with, in setting of constipation.  Patient will work on regulating bowels with over-the-counter MiraLAX.  We discussed weight gain as it relates to low back , knee pain.  Provided exercises on handout.  We consider physical therapy and follow-up.

## 2023-11-06 NOTE — Patient Instructions (Addendum)
 Exercises Ask your health care provider which exercises are safe for you. Do exercises exactly as told by your health care provider and adjust them as directed. It is normal to feel mild stretching, pulling, tightness, or discomfort as you do these exercises. Stop right away if you feel sudden pain or your pain gets worse. Do not begin these exercises until told by your health care provider. Stretching and range-of-motion exercises These exercises warm up your muscles and joints and improve the movement and flexibility of your knee. These exercises also help to relieve pain and swelling. Knee extension, prone  Lie on your abdomen (prone position) on a bed. Place your left / right knee just beyond the edge of the surface so your knee is not on the bed. You can put a towel under your left / right thigh just above your kneecap for comfort. Relax your leg muscles and allow gravity to straighten your knee (extension). You should feel a stretch behind your left / right knee. Hold this position for __________ seconds. Scoot up so your knee is supported between repetitions. Repeat __________ times. Complete this exercise __________ times a day. Knee flexion, active  Lie on your back with both legs straight. If this causes back discomfort, bend your left / right knee so your foot is flat on the floor. Slowly slide your left / right heel back toward your buttocks. Stop when you feel a gentle stretch in the front of your knee or thigh (flexion). Hold this position for __________ seconds. Slowly slide your left / right heel back to the starting position. Repeat __________ times. Complete this exercise __________ times a day. Quadriceps stretch, prone  Lie on your abdomen on a firm surface, such as a bed or padded floor. Bend your left / right knee and hold your ankle. If you cannot reach your ankle or pant leg, loop a belt around your foot and grab the belt instead. Gently pull your heel toward your  buttocks. Your knee should not slide out to the side. You should feel a stretch in the front of your thigh and knee (quadriceps). Hold this position for __________ seconds. Repeat __________ times. Complete this exercise __________ times a day. Hamstring, supine  Lie on your back (supine position). Loop a belt or towel over the ball of your left / right foot. The ball of your foot is on the walking surface, right under your toes. Straighten your left / right knee and slowly pull on the belt to raise your leg until you feel a gentle stretch behind your knee (hamstring). Do not let your knee bend while you do this. Keep your other leg flat on the floor. Hold this position for __________ seconds. Repeat __________ times. Complete this exercise __________ times a day. Strengthening exercises These exercises build strength and endurance in your knee. Endurance is the ability to use your muscles for a long time, even after they get tired. Quadriceps, isometric This exercise strengthens the muscles in front of your thigh (quadriceps) without moving your knee joint (isometric). Lie on your back with your left / right leg extended and your other knee bent. Put a rolled towel or small pillow under your knee if told by your health care provider. Slowly tense the muscles in the front of your left / right thigh. You should see your kneecap slide up toward your hip or see increased dimpling just above the knee. This motion will push the back of the knee toward the floor. For  __________ seconds, hold the muscle as tight as you can without increasing your pain. Relax the muscles slowly and completely. Repeat __________ times. Complete this exercise __________ times a day. Straight leg raises This exercise strengthens the muscles in front of your thigh (quadriceps) and the muscles that move your hips (hip flexors). Lie on your back with your left / right leg extended and your other knee bent. Tense the  muscles in the front of your left / right thigh. You should see your kneecap slide up or see increased dimpling just above the knee. Your thigh may even shake a bit. Keep these muscles tight as you raise your leg 4-6 inches (10-15 cm) off the floor. Do not let your knee bend. Hold this position for __________ seconds. Keep these muscles tense as you lower your leg. Relax your muscles slowly and completely after each repetition. Repeat __________ times. Complete this exercise __________ times a day. Hamstring, isometric  Lie on your back on a firm surface. Bend your left / right knee about __________ degrees. Dig your left / right heel into the surface as if you are trying to pull it toward your buttocks. Tighten the muscles in the back of your thighs (hamstring) to dig as hard as you can without increasing any pain. Hold this position for __________ seconds. Release the tension gradually and allow your muscles to relax completely for __________ seconds after each repetition. Repeat __________ times. Complete this exercise __________ times a day. Hamstring curls If told by your health care provider, do this exercise while wearing ankle weights. Begin with __________lb / kg weights. Then increase the weight by 1 lb (0.5 kg) increments. Do not wear ankle weights that are more than __________lb / kg. Lie on your abdomen with your legs straight. Bend your left / right knee as far as you can without feeling pain. Keep your hips flat against the floor. Hold this position for __________ seconds. Slowly lower your leg to the starting position. Repeat __________ times. Complete this exercise __________ times a day. Squats This exercise strengthens the muscles in front of your thigh and knee (quadriceps). Stand in front of a table, with your feet and knees pointing straight ahead. You may rest your hands on the table for balance but not for support. Slowly bend your knees and lower your hips like you  are going to sit in a chair. Keep your weight over your heels, not over your toes. Keep your lower legs upright so they are parallel with the table legs. Do not let your hips go lower than your knees. Do not bend lower than told by your health care provider. If your knee pain increases, do not bend as low. Hold the squat position for __________ seconds. Slowly push with your legs to return to standing. Do not use your hands to pull yourself to standing. Repeat __________ times. Complete this exercise __________ times a day. Wall slides This exercise strengthens the muscles in front of your thigh and knee (quadriceps). Lean your back against a smooth wall or door, and walk your feet out 18-24 inches (46-61 cm) from it. Place your feet hip-width apart. Slowly slide down the wall or door until your knees bend __________ degrees. Keep your knees over your heels, not over your toes. Keep your knees in line with your hips. Hold this position for __________ seconds. Repeat __________ times. Complete this exercise __________ times a day. Straight leg raises, side-lying This exercise strengthens the muscles that rotate the  leg at the hip and move it away from your body (hip abductors). Lie on your side with your left / right leg in the top position. Lie so your head, shoulder, knee, and hip line up. You may bend your bottom knee to help you keep your balance. Roll your hips slightly forward so your hips are stacked directly over each other and your left / right knee is facing forward. Leading with your heel, lift your top leg 4-6 inches (10-15 cm). You should feel the muscles in your outer hip lifting. Do not let your foot drift forward. Do not let your knee roll toward the ceiling. Hold this position for __________ seconds. Slowly return your leg to the starting position. Let your muscles relax completely after each repetition. Repeat __________ times. Complete this exercise __________ times a  day. Straight leg raises, prone This exercise stretches the muscles that move your hips away from the front of the pelvis (hip extensors). Lie on your abdomen on a firm surface. You can put a pillow under your hips if that is more comfortable. Tense the muscles in your buttocks and lift your left / right leg about 4-6 inches (10-15 cm). Keep your knee straight as you lift your leg. Hold this position for __________ seconds. Slowly lower your leg to the starting position. Let your leg relax completely after each repetition. Repeat __________ times. Complete this exercise __________ times a day. This information is not intended to replace advice given to you by your health care provider. Make sure you discuss any questions you have with your health care provider. Document Revised: 12/25/2020 Document Reviewed: 12/25/2020 [employer] Patient Education  2024 [employer]  Low Back Sprain or Strain Rehab Ask your health care provider which exercises are safe for you. Do exercises exactly as told by your health care provider and adjust them as directed. It is normal to feel mild stretching, pulling, tightness, or discomfort as you do these exercises. Stop right away if you feel sudden pain or your pain gets worse. Do not begin these exercises until told by your health care provider. Stretching and range-of-motion exercises These exercises warm up your muscles and joints and improve the movement and flexibility of your back. These exercises also help to relieve pain, numbness, and tingling. Lumbar rotation  Lie on your back on a firm bed or the floor with your knees bent. Straighten your arms out to your sides so each arm forms a 90-degree angle (right angle) with a side of your body. Slowly move (rotate) both of your knees to one side of your body until you feel a stretch in your lower back (lumbar). Try not to let your shoulders lift off the floor. Hold this position for __________ seconds. Tense  your abdominal muscles and slowly move your knees back to the starting position. Repeat this exercise on the other side of your body. Repeat __________ times. Complete this exercise __________ times a day. Single knee to chest  Lie on your back on a firm bed or the floor with both legs straight. Bend one of your knees. Use your hands to move your knee up toward your chest until you feel a gentle stretch in your lower back and buttock. Hold your leg in this position by holding on to the front of your knee. Keep your other leg as straight as possible. Hold this position for __________ seconds. Slowly return to the starting position. Repeat with your other leg. Repeat __________ times. Complete this exercise  __________ times a day. Prone extension on elbows  Lie on your abdomen on a firm bed or the floor (prone position). Prop yourself up on your elbows. Use your arms to help lift your chest up until you feel a gentle stretch in your abdomen and your lower back. This will place some of your body weight on your elbows. If this is uncomfortable, try stacking pillows under your chest. Your hips should stay down, against the surface that you are lying on. Keep your hip and back muscles relaxed. Hold this position for __________ seconds. Slowly relax your upper body and return to the starting position. Repeat __________ times. Complete this exercise __________ times a day. Strengthening exercises These exercises build strength and endurance in your back. Endurance is the ability to use your muscles for a long time, even after they get tired. Pelvic tilt This exercise strengthens the muscles that lie deep in the abdomen. Lie on your back on a firm bed or the floor with your legs extended. Bend your knees so they are pointing toward the ceiling and your feet are flat on the floor. Tighten your lower abdominal muscles to press your lower back against the floor. This motion will tilt your pelvis so  your tailbone points up toward the ceiling instead of pointing to your feet or the floor. To help with this exercise, you may place a small towel under your lower back and try to push your back into the towel. Hold this position for __________ seconds. Let your muscles relax completely before you repeat this exercise. Repeat __________ times. Complete this exercise __________ times a day. Alternating arm and leg raises  Get on your hands and knees on a firm surface. If you are on a hard floor, you may want to use padding, such as an exercise mat, to cushion your knees. Line up your arms and legs. Your hands should be directly below your shoulders, and your knees should be directly below your hips. Lift your left leg behind you. At the same time, raise your right arm and straighten it in front of you. Do not lift your leg higher than your hip. Do not lift your arm higher than your shoulder. Keep your abdominal and back muscles tight. Keep your hips facing the ground. Do not arch your back. Keep your balance carefully, and do not hold your breath. Hold this position for __________ seconds. Slowly return to the starting position. Repeat with your right leg and your left arm. Repeat __________ times. Complete this exercise __________ times a day. Abdominal set with straight leg raise  Lie on your back on a firm bed or the floor. Bend one of your knees and keep your other leg straight. Tense your abdominal muscles and lift your straight leg up, 4-6 inches (10-15 cm) off the ground. Keep your abdominal muscles tight and hold this position for __________ seconds. Do not hold your breath. Do not arch your back. Keep it flat against the ground. Keep your abdominal muscles tense as you slowly lower your leg back to the starting position. Repeat with your other leg. Repeat __________ times. Complete this exercise __________ times a day. Single leg lower with bent knees Lie on your back on a firm  bed or the floor. Tense your abdominal muscles and lift your feet off the floor, one foot at a time, so your knees and hips are bent in 90-degree angles (right angles). Your knees should be over your hips and your lower legs  should be parallel to the floor. Keeping your abdominal muscles tense and your knee bent, slowly lower one of your legs so your toe touches the ground. Lift your leg back up to return to the starting position. Do not hold your breath. Do not let your back arch. Keep your back flat against the ground. Repeat with your other leg. Repeat __________ times. Complete this exercise __________ times a day. Posture and body mechanics Good posture and healthy body mechanics can help to relieve stress in your body's tissues and joints. Body mechanics refers to the movements and positions of your body while you do your daily activities. Posture is part of body mechanics. Good posture means: Your spine is in its natural S-curve position (neutral). Your shoulders are pulled back slightly. Your head is not tipped forward (neutral). Follow these guidelines to improve your posture and body mechanics in your everyday activities. Standing  When standing, keep your spine neutral and your feet about hip-width apart. Keep a slight bend in your knees. Your ears, shoulders, and hips should line up. When you do a task in which you stand in one place for a long time, place one foot up on a stable object that is 2-4 inches (5-10 cm) high, such as a footstool. This helps keep your spine neutral. Sitting  When sitting, keep your spine neutral and keep your feet flat on the floor. Use a footrest, if necessary, and keep your thighs parallel to the floor. Avoid rounding your shoulders, and avoid tilting your head forward. When working at a desk or a computer, keep your desk at a height where your hands are slightly lower than your elbows. Slide your chair under your desk so you are close enough to  maintain good posture. When working at a computer, place your monitor at a height where you are looking straight ahead and you do not have to tilt your head forward or downward to look at the screen. Resting When lying down and resting, avoid positions that are most painful for you. If you have pain with activities such as sitting, bending, stooping, or squatting, lie in a position in which your body does not bend very much. For example, avoid curling up on your side with your arms and knees near your chest (fetal position). If you have pain with activities such as standing for a long time or reaching with your arms, lie with your spine in a neutral position and bend your knees slightly. Try the following positions: Lying on your side with a pillow between your knees. Lying on your back with a pillow under your knees. Lifting  When lifting objects, keep your feet at least shoulder-width apart and tighten your abdominal muscles. Bend your knees and hips and keep your spine neutral. It is important to lift using the strength of your legs, not your back. Do not lock your knees straight out. Always ask for help to lift heavy or awkward objects. This information is not intended to replace advice given to you by your health care provider. Make sure you discuss any questions you have with your health care provider. Document Revised: 08/18/2022 Document Reviewed: 07/02/2020 Elsevier Patient Education  2024 [employer].

## 2023-11-06 NOTE — Progress Notes (Signed)
 Assessment & Plan:  Weight gain Assessment & Plan: Discussed constellation of symptoms and no straightforward etiology at this time.  Agree that extensive vitamin and hormone evaluation is appropriate next step.  She would be interested in medication for weight loss after labs. We discussed constipation and my trepidation with starting GLP-1 agonist, if that is about we proceed with, in setting of constipation.  Patient will work on regulating bowels with over-the-counter MiraLAX.  We discussed weight gain as it relates to low back , knee pain.  Provided exercises on handout.  We consider physical therapy and follow-up.    Orders: -     Urinalysis, Routine w reflex microscopic; Future -     Comprehensive metabolic panel with GFR; Future -     H. pylori breath test; Future -     Cortisol-am, blood; Future -     PCOS Diagnostic Profile; Future -     Iron , TIBC and Ferritin Panel; Future -     B12 and Folate Panel; Future -     VITAMIN D  25 Hydroxy (Vit-D Deficiency, Fractures); Future -     Insulin , random; Future -     Lipid panel; Future -     Hemoglobin A1c; Future -     ANA; Future -     C-reactive protein; Future -     Sedimentation rate; Future -     CBC with Differential/Platelet; Future  PCOS (polycystic ovarian syndrome) -     PCOS Diagnostic Profile; Future     Return precautions given.   Risks, benefits, and alternatives of the medications and treatment plan prescribed today were discussed, and patient expressed understanding.   Education regarding symptom management and diagnosis given to patient on AVS either electronically or printed.  Return in about 2 months (around 01/07/2024).  [name], FNP  Subjective:    Patient ID: [name], female    DOB: December 13, 1990, 33 y.o.   MRN: [id number]  CC: [name] is a 33 y.o. female who presents today for follow up.   HPI: She is concerned with weight gain around abdomen since her son was born.  She would  like hormone evaluation  She is concerned with generalized hair loss.  She complains of hot and cold sweats for 4 weeks. She also complains of episodic nausea without vomiting.  Nausea is a/w HA. Nausea can get better with a meal, and then sometimes it will not.    She has h/o constipation however the last month, constipation has returned.  No dietary changes.  Endorses increased stress with work.   Last BM yesterday.  She uses NSAID 1-2 days per week. No alcohol use.   Denies fever, unusual weight loss, cough, abdominal pain.   LMP 10/31/23  Sleep is disrupted by anxiety and toddler whom wakes her up.   She denies acne, female balding, bruising, postural hypotension, salt craving  Negative celiac and alpha gal 2/25/221  Allergies: Aspirin, Broccoli [brassica oleracea], Levaquin [levofloxacin in d5w], Metformin  and related, and Sulfa  antibiotics Current Outpatient Medications on File Prior to Visit  Medication Sig Dispense Refill   albuterol  (VENTOLIN  HFA) 108 (90 Base) MCG/ACT inhaler Inhale 2 puffs into the lungs every 6 (six) hours as needed for wheezing or shortness of breath. 8 g 0   B Complex-C (SUPER B COMPLEX PO) Take by mouth.     budesonide -formoterol  (SYMBICORT ) 80-4.5 MCG/ACT inhaler Inhale 2 puffs into the lungs 2 (two) times daily. (Patient taking differently: Inhale  into the lungs 2 (two) times daily. Pt taking as needed) 1 each 1   buPROPion  (WELLBUTRIN  XL) 150 MG 24 hr tablet Take one  (150 mg ) tablet by mouth every morning 90 tablet 3   buPROPion  (WELLBUTRIN  XL) 300 MG 24 hr tablet Take 1 tablet (300 mg total) by mouth every morning. Take with wellbutrin  150mg  tablet 90 tablet 2   Cholecalciferol (VITAMIN D3) 125 MCG (5000 UT) CAPS Take by mouth.     Eszopiclone  3 MG TABS Take 1 tablet (3 mg total) by mouth at bedtime. Take immediately before bedtime 30 tablet 2   ibuprofen  (ADVIL ) 600 MG tablet Take 1 tablet (600 mg total) by mouth every 6 (six) hours as needed.      Magnesium 200 MG TABS Take by mouth.     sertraline  (ZOLOFT ) 100 MG tablet Take 1 tablet (100 mg total) by mouth at bedtime. 90 tablet 3   sertraline  (ZOLOFT ) 50 MG tablet Take 1 tablet (50 mg total) by mouth at bedtime. 90 tablet 3   No current facility-administered medications on file prior to visit.    Review of Systems  Constitutional:  Negative for chills and fever.  Respiratory:  Negative for cough.   Cardiovascular:  Negative for chest pain and palpitations.  Gastrointestinal:  Positive for constipation. Negative for nausea and vomiting.  Musculoskeletal:  Positive for back pain.      Objective:    BP 116/70   Pulse 70   Temp 98 F (36.7 C) (Oral)   Ht 5' 7 (1.702 m)   Wt 176 lb 9.6 oz (80.1 kg)   LMP  (LMP Unknown)   SpO2 99%   BMI 27.66 kg/m  BP Readings from Last 3 Encounters:  11/06/23 116/70  08/21/23 112/70  03/09/23 108/68   Wt Readings from Last 3 Encounters:  11/06/23 176 lb 9.6 oz (80.1 kg)  08/21/23 174 lb 12.8 oz (79.3 kg)  04/27/23 172 lb 12.8 oz (78.4 kg)    Physical Exam Vitals reviewed.  Constitutional:      Appearance: She is well-developed.  Eyes:     Conjunctiva/sclera: Conjunctivae normal.  Neck:     Thyroid: No thyroid mass or thyromegaly.  Cardiovascular:     Rate and Rhythm: Normal rate and regular rhythm.     Pulses: Normal pulses.     Heart sounds: Normal heart sounds.  Pulmonary:     Effort: Pulmonary effort is normal.     Breath sounds: Normal breath sounds. No wheezing, rhonchi or rales.  Musculoskeletal:     Lumbar back: No swelling, edema, spasms, tenderness or bony tenderness. Normal range of motion.     Right knee: No bony tenderness. Normal range of motion. Tenderness present.     Left knee: No bony tenderness. Normal range of motion.       Legs:     Comments: Full range of motion with flexion, tension, lateral side bends. No bony tenderness. No pain, numbness, tingling elicited with single leg raise  bilaterally.   Bilateral knees are symmetric. No effusion appreciated. No increase in warmth or erythema. Crepitus felt with flexion of bilateral knees.  Right knee:  Able to extend to -5 to 10 degrees and flex to 110 degrees. No catching with McMurray maneuver. No patellar apprehension. Negative anterior drawer and lachman's- no laxity appreciated.  No calf tenderness of lower leg edema bilaterally.    Lymphadenopathy:     Head:     Right side of head: No  submental, submandibular, tonsillar, preauricular, posterior auricular or occipital adenopathy.     Left side of head: No submental, submandibular, tonsillar, preauricular, posterior auricular or occipital adenopathy.     Cervical: No cervical adenopathy.  Skin:    General: Skin is warm and dry.  Neurological:     Mental Status: She is alert.     Sensory: No sensory deficit.     Deep Tendon Reflexes:     Reflex Scores:      Patellar reflexes are 2+ on the right side and 2+ on the left side.    Comments: Sensation and strength intact bilateral lower extremities.  Psychiatric:        Speech: Speech normal.        Behavior: Behavior normal.        Thought Content: Thought content normal.

## 2023-11-10 ENCOUNTER — Other Ambulatory Visit: Payer: Self-pay | Admitting: Family

## 2023-11-10 ENCOUNTER — Encounter: Payer: Self-pay | Admitting: Family

## 2023-11-10 DIAGNOSIS — F419 Anxiety disorder, unspecified: Secondary | ICD-10-CM

## 2023-11-11 NOTE — Telephone Encounter (Unsigned)
 Copied from CRM [id number]. Topic: Clinical - Medication Question >> Nov 11, 2023  2:07 PM Aisha D wrote: Reason for CRM: Pt stated that she has contacted the pharmacy for the Eszopiclone  3 MG TABS and the buPROPion  (WELLBUTRIN  XL) 150 MG 24 hr tablet. Pt stated that they informed her to contact the office because they don't have the medication. I informed the pt that the buPROPion  is currently pending and she also needs the Eszopiclone  refilled. Pt would also like to have the medication sent to [facility] of [facility] and would like a callback with an update.

## 2023-11-13 ENCOUNTER — Other Ambulatory Visit: Payer: Self-pay | Admitting: Family

## 2023-11-13 ENCOUNTER — Other Ambulatory Visit: Payer: Self-pay

## 2023-11-13 DIAGNOSIS — F32A Depression, unspecified: Secondary | ICD-10-CM

## 2023-11-13 DIAGNOSIS — G4700 Insomnia, unspecified: Secondary | ICD-10-CM

## 2023-11-13 MED ORDER — BUPROPION HCL ER (XL) 300 MG PO TB24: ORAL_TABLET | ORAL | 2 refills | Status: AC

## 2023-11-13 MED ORDER — BUPROPION HCL ER (XL) 150 MG PO TB24: ORAL_TABLET | ORAL | 3 refills | Status: AC

## 2023-11-13 MED ORDER — ESZOPICLONE 3 MG PO TABS: 3.0000 mg | ORAL_TABLET | Freq: Every day | ORAL | 2 refills | Status: DC

## 2023-11-13 NOTE — Telephone Encounter (Signed)
 The 300mg  of bupropion  was sent on 7/16 but pt needs refill of lunesta  3 mg.

## 2023-11-27 ENCOUNTER — Other Ambulatory Visit (INDEPENDENT_AMBULATORY_CARE_PROVIDER_SITE_OTHER)

## 2023-11-27 DIAGNOSIS — E282 Polycystic ovarian syndrome: Secondary | ICD-10-CM

## 2023-11-27 DIAGNOSIS — R635 Abnormal weight gain: Secondary | ICD-10-CM | POA: Diagnosis not present

## 2023-11-27 DIAGNOSIS — R110 Nausea: Secondary | ICD-10-CM | POA: Diagnosis not present

## 2023-11-27 DIAGNOSIS — K5900 Constipation, unspecified: Secondary | ICD-10-CM | POA: Diagnosis not present

## 2023-11-27 LAB — URINALYSIS, ROUTINE W REFLEX MICROSCOPIC
Bilirubin Urine: NEGATIVE
Ketones, ur: NEGATIVE
Leukocytes,Ua: NEGATIVE
Nitrite: NEGATIVE
Specific Gravity, Urine: 1.02 (ref 1.000–1.030)
Total Protein, Urine: NEGATIVE
Urine Glucose: NEGATIVE
Urobilinogen, UA: 0.2 (ref 0.0–1.0)
pH: 6.5 (ref 5.0–8.0)

## 2023-11-27 LAB — CBC WITH DIFFERENTIAL/PLATELET
Basophils Absolute: 0 K/uL (ref 0.0–0.1)
Basophils Relative: 0.7 % (ref 0.0–3.0)
Eosinophils Absolute: 0.1 K/uL (ref 0.0–0.7)
Eosinophils Relative: 1.4 % (ref 0.0–5.0)
HCT: 40.4 % (ref 36.0–46.0)
Hemoglobin: 13.4 g/dL (ref 12.0–15.0)
Lymphocytes Relative: 33.4 % (ref 12.0–46.0)
Lymphs Abs: 1.9 K/uL (ref 0.7–4.0)
MCHC: 33.1 g/dL (ref 30.0–36.0)
MCV: 92.7 fl (ref 78.0–100.0)
Monocytes Absolute: 0.4 K/uL (ref 0.1–1.0)
Monocytes Relative: 7.3 % (ref 3.0–12.0)
Neutro Abs: 3.2 K/uL (ref 1.4–7.7)
Neutrophils Relative %: 57.2 % (ref 43.0–77.0)
Platelets: 330 K/uL (ref 150.0–400.0)
RBC: 4.36 Mil/uL (ref 3.87–5.11)
RDW: 14.2 % (ref 11.5–15.5)
WBC: 5.6 K/uL (ref 4.0–10.5)

## 2023-11-27 LAB — VITAMIN D 25 HYDROXY (VIT D DEFICIENCY, FRACTURES): VITD: 27.95 ng/mL — ABNORMAL LOW (ref 30.00–100.00)

## 2023-11-27 LAB — COMPREHENSIVE METABOLIC PANEL WITH GFR
ALT: 11 U/L (ref 0–35)
AST: 11 U/L (ref 0–37)
Albumin: 4.1 g/dL (ref 3.5–5.2)
Alkaline Phosphatase: 63 U/L (ref 39–117)
BUN: 11 mg/dL (ref 6–23)
CO2: 24 meq/L (ref 19–32)
Calcium: 9 mg/dL (ref 8.4–10.5)
Chloride: 105 meq/L (ref 96–112)
Creatinine, Ser: 0.82 mg/dL (ref 0.40–1.20)
GFR: 92.83 mL/min (ref >60.00)
Glucose, Bld: 106 mg/dL — ABNORMAL HIGH (ref 70–99)
Potassium: 4 meq/L (ref 3.5–5.1)
Sodium: 138 meq/L (ref 135–145)
Total Bilirubin: 0.3 mg/dL (ref 0.2–1.2)
Total Protein: 6.8 g/dL (ref 6.0–8.3)

## 2023-11-27 LAB — LIPID PANEL
Cholesterol: 233 mg/dL — ABNORMAL HIGH (ref 0–200)
HDL: 51.8 mg/dL (ref >39.00)
LDL Cholesterol: 164 mg/dL — ABNORMAL HIGH (ref 0–99)
NonHDL: 181.1
Total CHOL/HDL Ratio: 4
Triglycerides: 86 mg/dL (ref 0.0–149.0)
VLDL: 17.2 mg/dL (ref 0.0–40.0)

## 2023-11-27 LAB — B12 AND FOLATE PANEL
Folate: 16.2 ng/mL (ref >5.9)
Vitamin B-12: 136 pg/mL — ABNORMAL LOW (ref 211–911)

## 2023-11-27 LAB — SEDIMENTATION RATE: Sed Rate: 17 mm/h (ref 0–20)

## 2023-11-27 LAB — C-REACTIVE PROTEIN: CRP: <1 mg/dL (ref 0.5–20.0)

## 2023-11-27 LAB — HEMOGLOBIN A1C: Hgb A1c MFr Bld: 5.7 % (ref 4.6–6.5)

## 2023-11-27 NOTE — Addendum Note (Signed)
 Addended by: TANDA HARVEY D on: 11/27/2023 07:28 AM   Modules accepted: Orders

## 2023-11-30 LAB — IRON,TIBC AND FERRITIN PANEL
%SAT: 15 % — ABNORMAL LOW (ref 16–45)
Ferritin: 11 ng/mL — ABNORMAL LOW (ref 16–154)
Iron: 51 ug/dL (ref 40–190)
TIBC: 346 ug/dL (ref 250–450)

## 2023-11-30 LAB — H. PYLORI BREATH TEST: H pylori Breath Test: NEGATIVE

## 2023-11-30 LAB — INSULIN, RANDOM: Insulin: 8.9 u[IU]/mL

## 2023-11-30 LAB — ANA: Anti Nuclear Antibody (ANA): NEGATIVE

## 2023-11-30 LAB — CORTISOL-AM, BLOOD: Cortisol - AM: 15.9 ug/dL

## 2023-12-08 ENCOUNTER — Ambulatory Visit: Payer: Self-pay | Admitting: Family

## 2023-12-08 DIAGNOSIS — E538 Deficiency of other specified B group vitamins: Secondary | ICD-10-CM

## 2023-12-08 DIAGNOSIS — R899 Unspecified abnormal finding in specimens from other organs, systems and tissues: Secondary | ICD-10-CM

## 2023-12-08 DIAGNOSIS — Z8639 Personal history of other endocrine, nutritional and metabolic disease: Secondary | ICD-10-CM

## 2023-12-08 DIAGNOSIS — R7989 Other specified abnormal findings of blood chemistry: Secondary | ICD-10-CM

## 2023-12-08 MED ORDER — CHOLECALCIFEROL 1.25 MG (50000 UT) PO TABS: ORAL_TABLET | ORAL | 0 refills | Status: AC

## 2023-12-15 LAB — PCOS DIAGNOSTIC PROFILE
17-Alpha-Hydroxyprogesterone: 37 ng/dL
ANTI-MULLERIAN HORMONE (AMH): 8.49 ng/mL
DHEA-Sulfate, LCMS: 188 ug/dL
Estradiol, Serum, MS: 25 pg/mL
Follicle Stimulating Hormone: 8.4 m[IU]/mL
Free Testosterone, Serum: 13 pg/mL — AB
Luteinizing Hormone (LH) ECL: 13 m[IU]/mL
Prolactin: 13.2 ng/mL
Sex Hormone Binding Globulin: 63.5 nmol/L
TSH: 0.99 uU/mL
Testosterone, Serum (Total): 129 ng/dL — AB
Testosterone-% Free: 1

## 2023-12-16 ENCOUNTER — Other Ambulatory Visit: Payer: Self-pay | Admitting: Family

## 2023-12-16 DIAGNOSIS — E7800 Pure hypercholesterolemia, unspecified: Secondary | ICD-10-CM

## 2023-12-16 NOTE — Telephone Encounter (Signed)
 Noted

## 2023-12-23 NOTE — Telephone Encounter (Signed)
 DONE

## 2023-12-23 NOTE — Addendum Note (Signed)
 Addended by: Beanca Kiester G on: 12/23/2023 01:34 PM   Modules accepted: Orders

## 2024-01-07 ENCOUNTER — Ambulatory Visit: Admitting: Family

## 2024-01-19 ENCOUNTER — Telehealth: Payer: Self-pay

## 2024-01-19 NOTE — Telephone Encounter (Signed)
 Copied from CRM #[id number]. Topic: Referral - Status >> Jan 19, 2024  2:27 PM Rea ORN wrote: Reason for CRM: Pt called to follow up on the referral request for Endocrinology. When she called the office she was referred to, they stated they never received the referral that was sent 12/23/23. Please re-fax to [phone]

## 2024-01-26 ENCOUNTER — Ambulatory Visit (HOSPITAL_BASED_OUTPATIENT_CLINIC_OR_DEPARTMENT_OTHER): Admitting: Internal Medicine

## 2024-01-26 ENCOUNTER — Encounter (HOSPITAL_BASED_OUTPATIENT_CLINIC_OR_DEPARTMENT_OTHER): Payer: Self-pay | Admitting: Internal Medicine

## 2024-01-26 VITALS — BP 102/78 | HR 71 | Ht 67.0 in | Wt 182.4 lb

## 2024-01-26 DIAGNOSIS — Z79899 Other long term (current) drug therapy: Secondary | ICD-10-CM

## 2024-01-26 DIAGNOSIS — E785 Hyperlipidemia, unspecified: Secondary | ICD-10-CM | POA: Diagnosis not present

## 2024-01-26 DIAGNOSIS — Z8249 Family history of ischemic heart disease and other diseases of the circulatory system: Secondary | ICD-10-CM

## 2024-01-26 MED ORDER — ROSUVASTATIN CALCIUM 20 MG PO TABS: 20.0000 mg | ORAL_TABLET | Freq: Every day | ORAL | 2 refills | Status: AC

## 2024-01-26 NOTE — Telephone Encounter (Signed)
 Unable to lvm but sent pt my chart message as well to inform her of message below per Rollene

## 2024-01-26 NOTE — Patient Instructions (Signed)
 Medication Instructions:   START TAKING ROSUVASTATIN (CRESTOR) 20 MG BY MOUTH DAILY  *If you need a refill on your cardiac medications before your next appointment, please call your pharmacy*   Lab Work:  IN 3 MONTHS HERE AT [facility] ON THE 3RD FLOOR [address]--NMR LIPOPROFILE AND LIPOPROTEIN A--PLEASE COME FASTING TO THIS LAB APPOINTMENT  If you have labs (blood work) drawn today and your tests are completely normal, you will receive your results only by: MyChart Message (if you have MyChart) OR A paper copy in the mail If you have any lab test that is abnormal or we need to change your treatment, we will call you to review the results.    Follow-Up:  AS NEEDED WITH DR. HILTY

## 2024-01-27 NOTE — Progress Notes (Signed)
 LIPID CLINIC CONSULT NOTE  Chief Complaint:  Manage dyslipidemia  Primary Care Physician: Dineen Rollene MATSU, FNP  Primary Cardiologist:  None  HPI:  [name] is a 33 y.o. female who is being seen today for the evaluation of dyslipidemia at the request of Douglass Kenney NOVAK, FNP.  This is a pleasant 33 year old female kindly referred for evaluation management of dyslipidemia.  She reports a strong family history of heart disease including her father had bypass in his 21s and mother who had an heart attack and a stroke as well I believe in her 28s.  She has a history of high cholesterol recently with total cholesterol 233, triglycerides 86, HDL 51 and LDL 164.  She is not currently on any lipid-lowering therapy.  PMHx:  Past Medical History:  Diagnosis Date   Anxiety 2008   Asthma    PCOS (polycystic ovarian syndrome)    UTI (urinary tract infection)     Past Surgical History:  Procedure Laterality Date   BREAST ENHANCEMENT SURGERY  2012   BREAST SURGERY  2013   Enhancement   CESAREAN SECTION N/A 01/15/2022   Procedure: CESAREAN SECTION;  Surgeon: [name], Debby PARAS, MD;  Location: [facility];  Service: Obstetrics;  Laterality: N/A;   SHOULDER SURGERY     TONSILLECTOMY      FAMHx:  Family History  Problem Relation Age of Onset   Arthritis Mother    Heart disease Mother    Stroke Mother    Hypertension Mother    Mental illness Mother    Diabetes Mother    Factor V Leiden deficiency Mother    Sleep apnea Mother    Depression Mother    Drug abuse Mother    Clotting disorder Mother        Factor 5 which i do not have   Heart attack Mother    Mental illness Father    Diabetes Father    CAD Father    Sleep apnea Father    Bleeding Disorder Father        prothrombin gene mutuation   Bipolar disorder Father    Hearing loss Father    Clotting disorder Father        Factor 2 which I inherited   Heart disease Father    Crohn's disease Brother    ADD / ADHD  Brother    Suicidality Maternal Grandfather    Clotting disorder Paternal Grandmother        Passed from intestinal clotting issues   Heart disease Paternal Grandmother    Kidney failure Paternal Grandfather    Kidney disease Paternal Grandfather    Bipolar disorder Paternal Aunt    Clotting disorder Paternal Aunt        Passed away from pulmonary embolism   Heart disease Maternal Grandmother    Heart attack Maternal Uncle    Heart disease Maternal Uncle    Breast cancer Neg Hx    Ovarian cancer Neg Hx    Colon cancer Neg Hx    Thyroid cancer Neg Hx     SOCHx:   reports that she has never smoked. She has never used smokeless tobacco. She reports that she does not currently use alcohol. She reports that she does not use drugs.  ALLERGIES:  Allergies  Allergen Reactions   Aspirin Anaphylaxis   Broccoli [Brassica Oleracea] Shortness Of Breath    Throat swelling   Levaquin [Levofloxacin In D5w] Hives   Metformin  And Related Other (See  Comments)    GI intolerance   Sulfa  Antibiotics Other (See Comments)    ROS: Pertinent items noted in HPI and remainder of comprehensive ROS otherwise negative.  HOME MEDS: Current Outpatient Medications on File Prior to Visit  Medication Sig Dispense Refill   albuterol  (VENTOLIN  HFA) 108 (90 Base) MCG/ACT inhaler Inhale 2 puffs into the lungs every 6 (six) hours as needed for wheezing or shortness of breath. 8 g 0   B Complex-C (SUPER B COMPLEX PO) Take by mouth.     budesonide -formoterol  (SYMBICORT ) 80-4.5 MCG/ACT inhaler Inhale 2 puffs into the lungs 2 (two) times daily. (Patient taking differently: Inhale into the lungs 2 (two) times daily. Pt taking as needed) 1 each 1   buPROPion  (WELLBUTRIN  XL) 150 MG 24 hr tablet Take one  (150 mg ) tablet by mouth every morning. Take with 300 mg 90 tablet 3   buPROPion  (WELLBUTRIN  XL) 300 MG 24 hr tablet TAKE 1 TABLET BY MOUTH EVERY MORNING. TAKE WITH BUPROPION  150 MG 90 tablet 2   Cholecalciferol   (VITAMIN D3) 125 MCG (5000 UT) CAPS Take by mouth.     Cholecalciferol  1.25 MG (50000 UT) TABS 50,000 units PO qwk for 8 weeks. 8 tablet 0   Eszopiclone  3 MG TABS Take 1 tablet (3 mg total) by mouth at bedtime. Take immediately before bedtime 30 tablet 2   ibuprofen  (ADVIL ) 600 MG tablet Take 1 tablet (600 mg total) by mouth every 6 (six) hours as needed.     Magnesium 200 MG TABS Take by mouth.     sertraline  (ZOLOFT ) 100 MG tablet Take 1 tablet (100 mg total) by mouth at bedtime. 90 tablet 3   sertraline  (ZOLOFT ) 50 MG tablet Take 1 tablet (50 mg total) by mouth at bedtime. 90 tablet 3   No current facility-administered medications on file prior to visit.    LABS/IMAGING: No results found for this or any previous visit (from the past 48 hours). No results found.  LIPID PANEL:    Component Value Date/Time   CHOL 233 (H) 11/27/2023 0722   TRIG 86.0 11/27/2023 0722   HDL 51.80 11/27/2023 0722   CHOLHDL 4 11/27/2023 0722   VLDL 17.2 11/27/2023 0722   LDLCALC 164 (H) 11/27/2023 0722    No results found for: LIPOA   WEIGHTS: Wt Readings from Last 3 Encounters:  01/26/24 182 lb 6.4 oz (82.7 kg)  11/06/23 176 lb 9.6 oz (80.1 kg)  08/21/23 174 lb 12.8 oz (79.3 kg)    VITALS: BP 102/78   Pulse 71   Ht 5' 7 (1.702 m)   Wt 182 lb 6.4 oz (82.7 kg)   SpO2 99%   BMI 28.57 kg/m   EXAM: Deferred  EKG: Deferred  ASSESSMENT: Dyslipidemia, goal LDL less than 70 Family history of premature onset cardiovascular disease  PLAN: 1.   Given her strong family history of cardiovascular disease and dyslipidemia, would recommend statin therapy for additional cardiovascular risk reduction.  Start rosuvastatin 20 mg daily.  Will plan repeat lipids including NMR and LP(a) in about 3 months.  Further adjustments at that time as necessary.  Thanks again for the kind referral.  Vinie KYM Maxcy, MD, [facility], FNLA, FACP  [place]  [facility]  Medical Director of the [facility]id  Disorders &  Cardiovascular Risk Reduction Clinic Diplomate of the American Board of Clinical Lipidology Attending Cardiologist  Direct Dial: [phone]  Fax: [phone]  Website:  [email or web address]  Vinie BROCKS Lemond Griffee 01/27/2024, 1:51 PM

## 2024-01-27 NOTE — Telephone Encounter (Signed)
 Pt is aware of message via my chart   01/26/2024  2:14 PM by Belizaire, Crissy C

## 2024-02-08 ENCOUNTER — Institutional Professional Consult (permissible substitution) (HOSPITAL_BASED_OUTPATIENT_CLINIC_OR_DEPARTMENT_OTHER): Admitting: Internal Medicine

## 2024-02-14 ENCOUNTER — Other Ambulatory Visit: Payer: Self-pay | Admitting: Family

## 2024-02-14 DIAGNOSIS — F410 Panic disorder [episodic paroxysmal anxiety] without agoraphobia: Secondary | ICD-10-CM

## 2024-02-14 DIAGNOSIS — F419 Anxiety disorder, unspecified: Secondary | ICD-10-CM

## 2024-02-18 ENCOUNTER — Ambulatory Visit: Admitting: Family

## 2024-03-04 ENCOUNTER — Encounter: Payer: Self-pay | Admitting: Family

## 2024-03-04 DIAGNOSIS — R635 Abnormal weight gain: Secondary | ICD-10-CM | POA: Diagnosis not present

## 2024-03-04 DIAGNOSIS — E282 Polycystic ovarian syndrome: Secondary | ICD-10-CM | POA: Diagnosis not present

## 2024-03-04 DIAGNOSIS — R7303 Prediabetes: Secondary | ICD-10-CM | POA: Diagnosis not present

## 2024-03-11 DIAGNOSIS — E282 Polycystic ovarian syndrome: Secondary | ICD-10-CM | POA: Diagnosis not present

## 2024-03-11 DIAGNOSIS — R7303 Prediabetes: Secondary | ICD-10-CM | POA: Diagnosis not present

## 2024-03-11 DIAGNOSIS — R635 Abnormal weight gain: Secondary | ICD-10-CM | POA: Diagnosis not present

## 2024-03-14 ENCOUNTER — Other Ambulatory Visit: Payer: Self-pay | Admitting: Family

## 2024-03-14 DIAGNOSIS — G4700 Insomnia, unspecified: Secondary | ICD-10-CM

## 2024-03-18 ENCOUNTER — Telehealth: Admitting: Family

## 2024-03-18 DIAGNOSIS — E7800 Pure hypercholesterolemia, unspecified: Secondary | ICD-10-CM

## 2024-03-18 DIAGNOSIS — E663 Overweight: Secondary | ICD-10-CM | POA: Diagnosis not present

## 2024-03-18 DIAGNOSIS — E282 Polycystic ovarian syndrome: Secondary | ICD-10-CM | POA: Diagnosis not present

## 2024-03-18 DIAGNOSIS — F4323 Adjustment disorder with mixed anxiety and depressed mood: Secondary | ICD-10-CM | POA: Diagnosis not present

## 2024-03-18 NOTE — Patient Instructions (Signed)
 Start wegovy  We have discussed starting non insulin  daily injectable medication called Wegovy  which is a glucagon like peptide (GLP 1) agonist and works by delaying gastric emptying and increasing insulin  secretion.It is given once per week. Most patients see significant weight loss with this drug class.   You may NOT take either medication if you or your family has history of thyroid , parathyroid, OR adrenal cancer. Please confirm you and your family does NOT have this history as this drug class has black box warning on this medication for that reason.   Please follow  directions on prescription and slowly increase from 0.25mg  [place] once per week ;stay here for 4 weeks. You may then increase to 0.5mg  [place] once per week and stay there for 4 weeks.  We can slowly titrate further at follow up with goal of no more than 1-2 lbs weight loss per week.  Semaglutide [facility])  Dose (mg) Once Weekly Titration:    If a dose is not tolerated, consider delaying further dose increases for  another 4 weeks.  If you are actively losing weight on a dose, do not increase medication.   Weeks 1 through 4  0.25 mg once weekly  Weeks 5 through 8  0.5 mg once weekly  Weeks 9 though 12  1 mg once weekly  Weeks 13 through 16  1.7 mg once weekly   Semaglutide Injection (Weight Management) What is this medication? SEMAGLUTIDE (SEM a GLOO tide) promotes weight loss. It may also be used to maintain weight loss. It works by decreasing appetite. Changes to diet and exercise are often combined with this medication. This medicine may be used for other purposes; ask your health care provider or pharmacist if you have questions. COMMON BRAND NAME(S): Tzhncb What should I tell my care team before I take this medication? They need to know if you have any of these conditions: Endocrine tumors (MEN 2) or if someone in your family had these tumors Eye disease, vision problems Gallbladder disease History of depression  or mental health disease History of pancreatitis Kidney disease Stomach or intestine problems Suicidal thoughts, plans, or attempt; a previous suicide attempt by you or a family member Thyroid  cancer or if someone in your family had thyroid  cancer An unusual or allergic reaction to semaglutide, other medications, foods, dyes, or preservatives Pregnant or trying to get pregnant Breast-feeding How should I use this medication? This medication is injected under the skin. You will be taught how to prepare and give it. Take it as directed on the prescription label. It is given once every week (every 7 days). Keep taking it unless your care team tells you to stop. It is important that you put your used needles and pens in a special sharps container. Do not put them in a trash can. If you do not have a sharps container, call your pharmacist or care team to get one. A special MedGuide will be given to you by the pharmacist with each prescription and refill. Be sure to read this information carefully each time. This medication comes with INSTRUCTIONS FOR USE. Ask your pharmacist for directions on how to use this medication. Read the information carefully. Talk to your pharmacist or care team if you have questions. Talk to your care team about the use of this medication in children. While it may be prescribed for children as young as 12 years for selected conditions, precautions do apply. Overdosage: If you think you have taken too much of this  medicine contact a poison control center or emergency room at once. NOTE: This medicine is only for you. Do not share this medicine with others. What if I miss a dose? If you miss a dose and the next scheduled dose is more than 2 days away, take the missed dose as soon as possible. If you miss a dose and the next scheduled dose is less than 2 days away, do not take the missed dose. Take the next dose at your regular time. Do not take double or extra doses. If you miss  your dose for 2 weeks or more, take the next dose at your regular time or call your care team to talk about how to restart this medication. What may interact with this medication? Insulin  and other medications for diabetes This list may not describe all possible interactions. Give your health care provider a list of all the medicines, herbs, non-prescription drugs, or dietary supplements you use. Also tell them if you smoke, drink alcohol, or use illegal drugs. Some items may interact with your medicine. What should I watch for while using this medication? Visit your care team for regular checks on your progress. It may be some time before you see the benefit from this medication. Drink plenty of fluids while taking this medication. Check with your care team if you have severe diarrhea, nausea, and vomiting, or if you sweat a lot. The loss of too much body fluid may make it dangerous for you to take this medication. This medication may affect blood sugar levels. Ask your care team if changes in diet or medications are needed if you have diabetes. If you or your family notice any changes in your behavior, such as new or worsening depression, thoughts of harming yourself, anxiety, other unusual or disturbing thoughts, or memory loss, call your care team right away. Women should inform their care team if they wish to become pregnant or think they might be pregnant. Losing weight while pregnant is not advised and may cause harm to the unborn child. Talk to your care team for more information. What side effects may I notice from receiving this medication? Side effects that you should report to your care team as soon as possible: Allergic reactions--skin rash, itching, hives, swelling of the face, lips, tongue, or throat Change in vision Dehydration--increased thirst, dry mouth, feeling faint or lightheaded, headache, dark yellow or brown urine Gallbladder problems--severe stomach pain, nausea, vomiting,  fever Heart palpitations--rapid, pounding, or irregular heartbeat Kidney injury--decrease in the amount of urine, swelling of the ankles, hands, or feet Pancreatitis--severe stomach pain that spreads to your back or gets worse after eating or when touched, fever, nausea, vomiting Thoughts of suicide or self-harm, worsening mood, feelings of depression Thyroid  cancer--new mass or lump in the neck, pain or trouble swallowing, trouble breathing, hoarseness Side effects that usually do not require medical attention (report to your care team if they continue or are bothersome): Diarrhea Loss of appetite Nausea Stomach pain Vomiting This list may not describe all possible side effects. Call your doctor for medical advice about side effects. You may report side effects to FDA at 1-800-FDA-1088. Where should I keep my medication? Keep out of the reach of children and pets. Refrigeration (preferred): Store in the refrigerator. Do not freeze. Keep this medication in the original container until you are ready to take it. Get rid of any unused medication after the expiration date. Room temperature: If needed, prior to cap removal, the pen can be stored  at room temperature for up to 28 days. Protect from light. If it is stored at room temperature, get rid of any unused medication after 28 days or after it expires, whichever is first. It is important to get rid of the medication as soon as you no longer need it or it is expired. You can do this in two ways: Take the medication to a medication take-back program. Check with your pharmacy or law enforcement to find a location. If you cannot return the medication, follow the directions in the MedGuide. NOTE: This sheet is a summary. It may not cover all possible information. If you have questions about this medicine, talk to your doctor, pharmacist, or health care provider.  2023 Elsevier/Gold Standard (2020-06-28 00:00:00) I would consider topamax

## 2024-03-18 NOTE — Progress Notes (Addendum)
 Virtual Visit via Video Note  I connected with [name] on 03/18/24 at 10:30 AM EST by a video enabled telemedicine application and verified that I am speaking with the correct person using two identifiers. Location patient: home Location provider: work  Persons participating in the virtual visit: patient, provider  I discussed the limitations of evaluation and management by telemedicine and the availability of in person appointments. The patient expressed understanding and agreed to proceed.  HPI: Discussed the use of AI scribe software for clinical note transcription with the patient, who gave verbal consent to proceed.  History of Present Illness   [name] is a 33 year old female who presents for a consultation regarding weight management and medication options.  She has been managing prediabetes and is currently not on metformin  due to GI side effects.    She is exploring other medication options for weight management, including GLP-1 receptor agonists.   She reports no history of pancreatitis and denies a personal or family history of medullary thyroid  cancer, MTC.  She occasionally experiences constipation.      Consult endocrine Dr Carlin Checked 1mg  DST to r/u [place] ( neg 03/11/24)   H/o prediabetes,PCOS     No h/o pancreatitis .  No personal h/o MTC, MEN   ROS: See pertinent positives and negatives per HPI.  EXAM:  BMI 28 01/26/24  VITALS per patient if applicable: There were no vitals taken for this visit. BP Readings from Last 3 Encounters:  01/26/24 102/78  11/06/23 116/70  08/21/23 112/70   Wt Readings from Last 3 Encounters:  01/26/24 182 lb 6.4 oz (82.7 kg)  11/06/23 176 lb 9.6 oz (80.1 kg)  08/21/23 174 lb 12.8 oz (79.3 kg)    GENERAL: alert, oriented, appears well and in no acute distress  HEENT: atraumatic, conjunttiva clear, no obvious abnormalities on inspection of external nose and ears  NECK: normal movements of the head  and neck  LUNGS: on inspection no signs of respiratory distress, breathing rate appears normal, no obvious gross SOB, gasping or wheezing  CV: no obvious cyanosis  MS: moves all visible extremities without noticeable abnormality  PSYCH/NEURO: pleasant and cooperative, no obvious depression or anxiety, speech and thought processing grossly intact  ASSESSMENT AND PLAN: PCOS (polycystic ovarian syndrome) Assessment & Plan: Patient interested in GLP agonist.  I think this is reasonable in the setting of prediabetes.  Trial of Wegovy.  Discussed blackbox warning, mechanism of action, titration.  Alternatively, may consider Topamax.      -we discussed possible serious and likely etiologies, options for evaluation and workup, limitations of telemedicine visit vs in person visit, treatment, treatment risks and precautions. Pt prefers to treat via telemedicine empirically rather then risking or undertaking an in person visit at this moment.    I discussed the assessment and treatment plan with the patient. The patient was provided an opportunity to ask questions and all were answered. The patient agreed with the plan and demonstrated an understanding of the instructions.   The patient was advised to call back or seek an in-person evaluation if the symptoms worsen or if the condition fails to improve as anticipated.  Advised if desired AVS can be mailed or viewed via MyChart if Mychart user.   [name], FNP

## 2024-03-18 NOTE — Assessment & Plan Note (Signed)
 Patient interested in GLP agonist.  I think this is reasonable in the setting of prediabetes.  Trial of Wegovy.  Discussed blackbox warning, mechanism of action, titration.  Alternatively, may consider Topamax.

## 2024-03-24 ENCOUNTER — Encounter: Payer: Self-pay | Admitting: Family

## 2024-03-29 MED ORDER — WEGOVY 0.25 MG/0.5ML ~~LOC~~ SOAJ: 0.2500 mg | SUBCUTANEOUS | 2 refills | Status: AC

## 2024-03-29 NOTE — Addendum Note (Signed)
 Addended by: DINEEN ROLLENE MATSU on: 03/29/2024 12:49 PM   Modules accepted: Orders
